# Patient Record
Sex: Male | Born: 1960 | Hispanic: No | Marital: Married | Smoking: Never smoker
Health system: Southern US, Community
[De-identification: ages and names within clinical notes are randomized; demographics above are authoritative.]

## PROBLEM LIST (undated history)

## (undated) DIAGNOSIS — M79606 Pain in leg, unspecified: Secondary | ICD-10-CM

## (undated) DIAGNOSIS — G709 Myoneural disorder, unspecified: Secondary | ICD-10-CM

## (undated) DIAGNOSIS — G20 Parkinson's disease: Secondary | ICD-10-CM

## (undated) DIAGNOSIS — G249 Dystonia, unspecified: Secondary | ICD-10-CM

## (undated) DIAGNOSIS — M1990 Unspecified osteoarthritis, unspecified site: Secondary | ICD-10-CM

## (undated) DIAGNOSIS — G20A1 Parkinson's disease without dyskinesia, without mention of fluctuations: Secondary | ICD-10-CM

## (undated) DIAGNOSIS — Z86010 Personal history of colonic polyps: Principal | ICD-10-CM

## (undated) HISTORY — DX: Personal history of colonic polyps: Z86.010

## (undated) HISTORY — DX: Myoneural disorder, unspecified: G70.9

## (undated) HISTORY — PX: OTHER SURGICAL HISTORY: SHX169

## (undated) HISTORY — DX: Parkinson's disease without dyskinesia, without mention of fluctuations: G20.A1

## (undated) HISTORY — DX: Pain in leg, unspecified: M79.606

## (undated) HISTORY — PX: COLONOSCOPY: SHX174

## (undated) HISTORY — DX: Parkinson's disease: G20

---

## 2010-11-10 ENCOUNTER — Encounter (INDEPENDENT_AMBULATORY_CARE_PROVIDER_SITE_OTHER): Payer: Self-pay | Admitting: *Deleted

## 2010-11-26 ENCOUNTER — Encounter (INDEPENDENT_AMBULATORY_CARE_PROVIDER_SITE_OTHER): Payer: Self-pay | Admitting: *Deleted

## 2010-11-28 ENCOUNTER — Ambulatory Visit
Admission: RE | Admit: 2010-11-28 | Discharge: 2010-11-28 | Payer: Self-pay | Source: Home / Self Care | Attending: Internal Medicine | Admitting: Internal Medicine

## 2010-12-10 ENCOUNTER — Other Ambulatory Visit: Payer: Self-pay | Admitting: Internal Medicine

## 2010-12-10 ENCOUNTER — Ambulatory Visit
Admission: RE | Admit: 2010-12-10 | Discharge: 2010-12-10 | Payer: Self-pay | Source: Home / Self Care | Attending: Internal Medicine | Admitting: Internal Medicine

## 2010-12-10 DIAGNOSIS — Z860100 Personal history of colon polyps, unspecified: Secondary | ICD-10-CM

## 2010-12-10 HISTORY — DX: Personal history of colonic polyps: Z86.010

## 2010-12-10 HISTORY — DX: Personal history of colon polyps, unspecified: Z86.0100

## 2010-12-15 ENCOUNTER — Encounter: Payer: Self-pay | Admitting: Internal Medicine

## 2010-12-25 NOTE — Procedures (Addendum)
Summary: Colonoscopy  Patient: [name] Note: All result statuses are Final unless otherwise noted.  Tests: (1) Colonoscopy (COL)   COL Colonoscopy           DONE     [facility]     [address]     [place], [place]  [address]           COLONOSCOPY PROCEDURE REPORT           PATIENT:  Antonio [name], Antonio [name]  MR#:  [id number]     BIRTHDATE:  19-Dec-1960, 49 yrs. old  GENDER:  male     ENDOSCOPIST:  Iva Boop, MD, [facility]     REF. BY:  Bradd Canary, M.D.     PROCEDURE DATE:  12/10/2010     PROCEDURE:  Colonoscopy with snare polypectomy     ASA CLASS:  Class II     INDICATIONS:  Routine Risk Screening     MEDICATIONS:   Fentanyl 50 mcg IV, Versed 5 mg IV           DESCRIPTION OF PROCEDURE:   After the risks benefits and     alternatives of the procedure were thoroughly explained, informed     consent was obtained.  Digital rectal exam was performed and     revealed no abnormalities and normal prostate.   The LB 180AL     [id number] endoscope was introduced through the anus and advanced to     the cecum, which was identified by both the appendix and ileocecal     valve, without limitations.  The quality of the prep was     excellent, using MoviPrep.  The instrument was then slowly     withdrawn as the colon was fully examined. Insertion: 3:53 minutes     Withdrawal: 12:51 minutes     <<PROCEDUREIMAGES>>           FINDINGS:  A diminutive polyp was found in the descending colon.     It was 5 mm in size.  This was otherwise a normal examination of     the colon.   Retroflexed views in the rectum revealed no     abnormalities.    The scope was then withdrawn from the patient     and the procedure completed.           COMPLICATIONS:  None     ENDOSCOPIC IMPRESSION:     1) 5 mm polyp in the descending colon - removed     2) Otherwise normal examination with excellent prep           REPEAT EXAM:  In for Colonoscopy, pending biopsy results.           Iva Boop, MD, [facility]          CC:  Bradd Canary, M.D. (at [facility])     The Patient           n.     eSIGNED:   Iva Boop at 12/10/2010 11:48 AM           Joeseph Amor, [id number]  Note: An exclamation mark (!) indicates a result that was not dispersed into the flowsheet. Document Creation Date: 12/10/2010 11:49 AM _______________________________________________________________________  (1) Order result status: Final Collection or observation date-time: 12/10/2010 11:42 Requested date-time:  Receipt date-time:  Reported date-time:  Referring Physician:   Ordering Physician: Stan Head [phone]) Specimen Source:  Source: Launa Grill Order Number: [id number] Lab site:  Appended Document: Colonoscopy     Procedures Next Due Date:    Colonoscopy: 11/2015

## 2010-12-25 NOTE — Miscellaneous (Signed)
Summary: [facility]  Clinical Lists Changes  Medications: Added new medication of MOVIPREP 100 GM  SOLR (PEG-KCL-NACL-NASULF-NA ASC-C) As per prep instructions. - Signed Rx of MOVIPREP 100 GM  SOLR (PEG-KCL-NACL-NASULF-NA ASC-C) As per prep instructions.;  #1 x 0;  Signed;  Entered by: Ezra Sites RN;  Authorized by: Iva Boop MD, [facility];  Method used: [phone] [address] [place], [place], [place]  [address], Ph: [phone] or [phone], Fax: [phone] Observations: Added new observation of NKA: T (11/28/2010 12:42)    Prescriptions: MOVIPREP 100 GM  SOLR (PEG-KCL-NACL-NASULF-NA ASC-C) As per prep instructions.  #1 x 0   Entered by:   Ezra Sites RN   Authorized by:   Iva Boop MD, [facility]   Signed by:   Ezra Sites RN on 11/28/2010   Method used:   [facility]ronically to        [facility]  [address] [phone][facility] (retail)       [address] [place], [place]  [address]       Ph: [phone] or [phone]       Fax: [phone]   RxID:   [id number]

## 2010-12-25 NOTE — Letter (Signed)
Summary: Patient Notice- Polyp Results  [facility]  [address] [place], [place] [address]   Phone: [phone]  Fax: [phone]        December 15, 2010 MRN: [id number]    Antonio [name] [address] [place], [place]  [address]    Dear [name],  The polyp removed from your colon was adenomatous. This means that it was pre-cancerous or that  it had the potential to change into cancer over time.  I recommend that you have a repeat colonoscopy in 5 years to determine if you have developed any new polyps over time and screen for colorectal cancer. If you develop any new rectal bleeding, abdominal pain or significant bowel habit changes, please contact us before then.  In addition to repeating colonoscopy, changing health habits may reduce your risk of having more colon or rectal  polyps and possibly, colorectal cancer. You may lower your risk of future polyps and colorectal cancer by adopting healthy habits such as not smoking or using tobacco (if you do), being physically active, losing weight (if overweight), and eating a diet which includes fruits and vegetables and limits red meat.  Please call us if you are having persistent problems or have questions about your condition that have not been fully answered at this time.  Sincerely,  Iva Boop MD, [facility]  This letter has been electronically signed by your physician.  Appended Document: Patient Notice- Polyp Results LETTER MAILED

## 2010-12-25 NOTE — Letter (Signed)
Summary: Moviprep Instructions  [facility]  [address]   [place], [place] [address]   Phone: [phone]  Fax: [phone]       WEBBER MICHIELS    1961/05/25    MRN: [id number]        Procedure Day Dorna Bloom: Wednesday, 12-10-10     Arrival Time: 10:30 a.m.     Procedure Time: 11:30 a.m.     Location of Procedure:                    x   [facility] (4th Floor)   PREPARATION FOR COLONOSCOPY WITH MOVIPREP   Starting 5 days prior to your procedure 12-05-10 do not eat nuts, seeds, popcorn, corn, beans, peas,  salads, or any raw vegetables.  Do not take any fiber supplements (e.g. Metamucil, Citrucel, and Benefiber).  THE DAY BEFORE YOUR PROCEDURE         DATE: 12-09-10  DAY: Tuesday  1.  Drink clear liquids the entire day-NO SOLID FOOD  2.  Do not drink anything colored red or purple.  Avoid juices with pulp.  No orange juice.  3.  Drink at least 64 oz. (8 glasses) of fluid/clear liquids during the day to prevent dehydration and help the prep work efficiently.  CLEAR LIQUIDS INCLUDE: Water Jello Ice Popsicles Tea (sugar ok, no milk/cream) Powdered fruit flavored drinks Coffee (sugar ok, no milk/cream) Gatorade Juice: apple, white grape, white cranberry  Lemonade Clear bullion, consomm, broth Carbonated beverages (any kind) Strained chicken noodle soup Hard Candy                             4.  In the morning, mix first dose of MoviPrep solution:    Empty 1 Pouch A and 1 Pouch B into the disposable container    Add lukewarm drinking water to the top line of the container. Mix to dissolve    Refrigerate (mixed solution should be used within 24 hrs)  5.  Begin drinking the prep at 5:00 p.m. The MoviPrep container is divided by 4 marks.   Every 15 minutes drink the solution down to the next mark (approximately 8 oz) until the full liter is complete.   6.  Follow completed prep with 16 oz of clear liquid of your choice (Nothing red or purple).   Continue to drink clear liquids until bedtime.  7.  Before going to bed, mix second dose of MoviPrep solution:    Empty 1 Pouch A and 1 Pouch B into the disposable container    Add lukewarm drinking water to the top line of the container. Mix to dissolve    Refrigerate  THE DAY OF YOUR PROCEDURE      DATE: 12-10-10 DAY: Wednesday  Beginning at 6:30 a.m. (5 hours before procedure):         1. Every 15 minutes, drink the solution down to the next mark (approx 8 oz) until the full liter is complete.  2. Follow completed prep with 16 oz. of clear liquid of your choice.    3. You may drink clear liquids until 9:30 a.m. (2 HOURS BEFORE PROCEDURE).   MEDICATION INSTRUCTIONS  Unless otherwise instructed, you should take regular prescription medications with a small sip of water   as early as possible the morning of your procedure.           OTHER INSTRUCTIONS  You will need a responsible adult  at least 53 years of age to accompany you and drive you home.   This person must remain in the waiting room during your procedure.  Wear loose fitting clothing that is easily removed.  Leave jewelry and other valuables at home.  However, you may wish to bring a book to read or  an iPod/MP3 player to listen to music as you wait for your procedure to start.  Remove all body piercing jewelry and leave at home.  Total time from sign-in until discharge is approximately 2-3 hours.  You should go home directly after your procedure and rest.  You can resume normal activities the  day after your procedure.  The day of your procedure you should not:   Drive   Make legal decisions   Operate machinery   Drink alcohol   Return to work  You will receive specific instructions about eating, activities and medications before you leave.    The above instructions have been reviewed and explained to me by  Ezra Sites RN  November 28, 2010 1:00 PM      I fully understand and can  verbalize these instructions _____________________________ Date _________

## 2010-12-25 NOTE — Letter (Signed)
Summary: Pre Visit Letter Revised  [facility]  [address] [place], [place] [address]   Phone: [phone]  Fax: [phone]        11/10/2010 MRN: [id number] Antonio [name] [address] [place], [place]  [address]             Procedure Date:  12/10/2010  Welcome to the Gastroenterology Division at [facility].    You are scheduled to see a nurse for your pre-procedure visit on 11/28/2010 at 1:00 on the 3rd floor at [facility], [address]  We ask that you try to arrive at our office 15 minutes prior to your appointment time to allow for check-in.  Please take a minute to review the attached form.  If you answer "Yes" to one or more of the questions on the first page, we ask that you call the person listed at your earliest opportunity.  If you answer "No" to all of the questions, please complete the rest of the form and bring it to your appointment.    Your nurse visit will consist of discussing your medical and surgical history, your immediate family medical history, and your medications.   If you are unable to list all of your medications on the form, please bring the medication bottles to your appointment and we will list them.  We will need to be aware of both prescribed and over the counter drugs.  We will need to know exact dosage information as well.    Please be prepared to read and sign documents such as consent forms, a financial agreement, and acknowledgement forms.  If necessary, and with your consent, a friend or relative is welcome to sit-in on the nurse visit with you.  Please bring your insurance card so that we may make a copy of it.  If your insurance requires a referral to see a specialist, please bring your referral form from your primary care physician.  No co-pay is required for this nurse visit.     If you cannot keep your appointment, please call [phone] to cancel or reschedule prior to your appointment date.  This  allows [place] the opportunity to schedule an appointment for another patient in need of care.    Thank you for choosing [facility] for your medical needs.  We appreciate the opportunity to care for you.  Please visit [place] at our website  to learn more about our practice.  Sincerely, The Gastroenterology Division

## 2013-05-19 ENCOUNTER — Telehealth: Payer: Self-pay | Admitting: Neurology

## 2013-06-16 ENCOUNTER — Telehealth: Payer: Self-pay | Admitting: Neurology

## 2013-06-19 ENCOUNTER — Ambulatory Visit: Payer: Self-pay | Admitting: Neurology

## 2013-06-22 ENCOUNTER — Encounter: Payer: Self-pay | Admitting: Neurology

## 2013-06-23 ENCOUNTER — Ambulatory Visit (INDEPENDENT_AMBULATORY_CARE_PROVIDER_SITE_OTHER): Payer: BC Managed Care – PPO | Admitting: Neurology

## 2013-06-23 ENCOUNTER — Encounter: Payer: Self-pay | Admitting: Neurology

## 2013-06-23 VITALS — BP 120/73 | HR 57 | Ht 75.0 in | Wt 248.0 lb

## 2013-06-23 DIAGNOSIS — F32A Depression, unspecified: Secondary | ICD-10-CM

## 2013-06-23 DIAGNOSIS — G20 Parkinson's disease: Secondary | ICD-10-CM | POA: Insufficient documentation

## 2013-06-23 DIAGNOSIS — G2581 Restless legs syndrome: Secondary | ICD-10-CM

## 2013-06-23 DIAGNOSIS — F3289 Other specified depressive episodes: Secondary | ICD-10-CM

## 2013-06-23 DIAGNOSIS — F329 Major depressive disorder, single episode, unspecified: Secondary | ICD-10-CM

## 2013-06-23 NOTE — Progress Notes (Addendum)
Provider:  Dr Hosie Poisson Referring Provider: No ref. provider found Primary Care Physician:  No primary provider on file.  No chief complaint on file.   HPI:  Antonio [name] is a 52 y.o. male here as a follow up of his Parkinson's disease characterized predominantly by right sided tremor and bradykinesia. Last visit was Jan 2013.  Interim Hx:  Overall doing well since last visit. Feels his PD is well controlled, main difficulty is with RLS symptoms at night.  Medications/compliance: Has self adjusted his medications to a regimen that gives him the most relief. Currently taking Sinemet 2 tabs at breakfast, lunch and 5pm and 1 tablet at bedtime. Also taking Mirapex 0.25mg  1 tab at 9pm and 1 tab at 11-12pm. Feels this regimen helps him to function well during the day and sleep at night with controlled RLS symptoms. Has tried taking Mirapex 0.25mg  during the day but did not notice a benefit. Tried Azilect in the past but did not tolerated due to adverse effects.   Benefit onset/wearing off/side effects Denies any motor fluctuations. Recently has noticed some mild dyskineisas, unsure if peak dose or not. Tolerating DA well, no ICD, no hallucinations, no excessive fatigue, no LE edema  Muscle cramps/neck pain/sialorrhea: Denies  Sleep/RBD: Sleeping well, no RBD, no vivid dreams. Gets up once a night to urinate  Constipation/mood/anxiety Minimal constipation, eats fiber.Notes some depressed mood/irritation. Wishes to monitor and try medication adjustments  Gait/falls/exercise: No gait instability or falls. Remains active, working 2 jobs.  Per Dr Alena Bills last note from 11/25/2011: 52y/o gentleman R handed initially seen 09/2008 with R sided stiffness, + family hx of PD and exposure to pesticides. Initially started on Azilect. Mirapex added 2011 due to increased difficulty using R hand. Notes pain in his feet. No ICD with DA. Started Sinemet 25-100 TID. Stopped Azilect. At last visit, plan was to take  off of Mirapex, switch to Neupro 2mg  and take Sinemet 25-100 2 tablets at breakfast, 2 tabs at lunch and 1 tablet at 4pm.   Concerns/Questions:Review of Systems: Out of a complete 14 system review, the patient complains of only the following symptoms, and all other reviewed systems are negative. Positive for sleepiness restless legs  History   Social History  . Marital Status: Married    Spouse Name: Antonio [name]    Number of Children: 3  . Years of Education: N/A   Occupational History  .  [employer]   Social History Main Topics  . Smoking status: Never Smoker   . Smokeless tobacco: Never Used  . Alcohol Use: 3.0 oz/week    5 Glasses of wine per week  . Drug Use: No  . Sexually Active: Not on file   Other Topics Concern  . Not on file   Social History Narrative      Patient lives at home with his wife Antonio [name]. 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   Caffeine- one daily             Family History  Problem Relation Age of Onset  . Heart disease Mother   . Diabetes Father   . Parkinson's disease Father     Past Medical History  Diagnosis Date  . Leg pain left  . Parkinson's disease     Past Surgical History  Procedure Laterality Date  . None listed      Current Outpatient Prescriptions  Medication Sig Dispense Refill  . carbidopa-levodopa (PARCOPA) 25-100 MG per disintegrating tablet Take 1 tablet by  mouth 3 (three) times daily.      . cholecalciferol (VITAMIN D) 1000 UNITS tablet Take 1,000 Units by mouth daily.      Marland Kitchen ibuprofen (ADVIL,MOTRIN) 200 MG tablet Take 200 mg by mouth every 6 (six) hours as needed for pain.      . rotigotine (NEUPRO) 2 MG/24HR Place 1 patch onto the skin daily.      . valACYclovir (VALTREX) 1000 MG tablet Take 1,000 mg by mouth as needed.       No current facility-administered medications for this visit.    Allergies as of 06/23/2013  . (Not on File)    Vitals: There were no vitals taken for this  visit. Last Weight:  Wt Readings from Last 1 Encounters:  No data found for Wt   Last Height:   Ht Readings from Last 1 Encounters:  No data found for Ht     Physical exam: Exam: Gen: NAD, conversant Eyes: anicteric sclerae, moist conjunctivae HENT: [place] Lungs: CTA, no wheezing, rales, rhonic                          CV: RRR, no MRG Abdomen: Soft, non-tender;  Extremities: No peripheral edema  Skin: Normal temperature, no rash,  Psych: Appropriate affect, pleasant  Neuro: MS: AA&Ox3, appropriately interactive, normal affect   Attention: WORLD backwards  Speech: fluent w/o paraphasic error  Memory: good recent and remote recall  CN: PERRL, EOMI no nystagmus, no ptosis, sensation intact to LT V1-V3 bilat, face symmetric, no weakness, hearing grossly intact, palate elevates symmetrically, shoulder shrug 5/5 bilat,  tongue protrudes midline, no fasiculations noted.  Motor: normal bulk and tone Strength: 5/5  In all extremities  Coord: rapid alternating and point-to-point (FNF, HTS) movements intact.  Reflexes: symmetrical, bilat downgoing toes  Sens: LT intact in all extremities  Speech: hypophonic/dysarthria  Motor UPDRS brief:  Facial Expression: Mild masked facies  Tremor: Rest R 1 L 0 Action/postural R 1 L 0 Rigidity: RUE 1 LUE 0 Finger taps:   R: 1 L: 0  Open/close hands: R: 1 L: 0  Foot taps: R: 1 L: 0  Arising from Chair: 0 Gait/FOG: Upright, good stride, no shuffling steps, decreased arm swing on R, negative pull test   Assessment:  After physical and neurologic examination, review of laboratory studies, imaging, neurophysiology testing and pre-existing records, assessment will be reviewed on the problem list.  Plan:  Treatment plan and additional workup will be reviewed under Problem List.  Antonio [name] is a pleasant 52 year old gentleman with a diagnosis of Parkinson disease since 2009. Returns today for follow up visit with  his last visit being over one year ago. He reports overall he is doing clinically well, his main concern is continued difficulty with restless leg syndrome at night. He has self adjusted his medications and is currently taking a total of 700 mg of L-dopa daily and 0.5 mg of Mirapex daily. She is tolerating both his medications well and is not noting any adverse effects or motor or enough. Exam is clinically stable with predominant right-sided symptoms of mild tremor and bradykinesia.  #1 Parkinson's disease  #2 restless leg syndrome  #3 depression  For his age and disease state (HY 1 to 1.5)he is currently on a rather high dose of L-dopa, we discussed this concern and the potential risk of prolonged high-dose L-dopa therapy at a younger age. Patient expressed understanding of the risk of developing motor fluctuations, wearing  off and dyskinesias. Based on his preference of self adjusting his medication we discussed trying to self wean off some of the L-dopa and potentially trying to increase the dopamine agonist dose. The patient will attempt this over the next 6 months until his next visit. He was instructed to call the office with any concerns or questions. Potential adverse effects of DA use were discussed. Consider checking iron levels at next visit.   A total of 30 minutes was spent with this patient. Over half this time was spent in direct face-to-face consultation. We discussed his current diagnoses. We discussed potential risk of the medications he is on. We discussed potential risk of high-dose L-dopa therapy and ways to reduce this. All questions were fully answered.   I have read the note, and I agree with the clinical assessment and plan.  Antonio [name]

## 2013-06-23 NOTE — Patient Instructions (Addendum)
Overall you are doing fairly well but I do want to suggest a few things today:   Remember to drink plenty of fluid, eat healthy meals and do not skip any meals. Try to eat protein with a every meal and eat a healthy snack such as fruit or nuts in between meals. Try to keep a regular sleep-wake schedule and try to exercise daily, particularly in the form of walking, 20-30 minutes a day, if you can.   As far as your medications are concerned, I would like to suggest you try to decrease your total dose of Sinemet daily. We discussed different options such as trying to increase her Mirapex and/or and down and spread a night or L-dopa doses.  I would like to see you back in 6 months, sooner if we need to. Please call us with any interim questions, concerns, problems, updates or refill requests.   Please also call us for any test results so we can go over those with you on the phone.  My clinical assistant and will answer any of your questions and relay your messages to me and also relay most of my messages to you.   Our phone number is [phone]. We also have an after hours call service for urgent matters and there is a physician on-call for urgent questions. For any emergencies you know to call 911 or go to the nearest emergency room

## 2013-12-29 ENCOUNTER — Encounter: Payer: Self-pay | Admitting: Neurology

## 2013-12-29 ENCOUNTER — Ambulatory Visit (INDEPENDENT_AMBULATORY_CARE_PROVIDER_SITE_OTHER): Payer: BC Managed Care – PPO | Admitting: Neurology

## 2013-12-29 VITALS — BP 108/64 | HR 55 | Ht 75.0 in | Wt 253.0 lb

## 2013-12-29 DIAGNOSIS — G2581 Restless legs syndrome: Secondary | ICD-10-CM

## 2013-12-29 DIAGNOSIS — G20 Parkinson's disease: Secondary | ICD-10-CM

## 2013-12-29 MED ORDER — PRAMIPEXOLE DIHYDROCHLORIDE 0.75 MG PO TABS: 0.7500 mg | ORAL_TABLET | Freq: Three times a day (TID) | ORAL | Status: DC

## 2013-12-29 MED ORDER — CARBIDOPA-LEVODOPA 25-100 MG PO TABS: 0.5000 | ORAL_TABLET | Freq: Three times a day (TID) | ORAL | Status: DC

## 2013-12-29 NOTE — Patient Instructions (Addendum)
Overall you are doing fairly well but I do want to suggest a few things today:   Remember to drink plenty of fluid, eat healthy meals and do not skip any meals. Try to eat protein with a every meal and eat a healthy snack such as fruit or nuts in between meals. Try to keep a regular sleep-wake schedule and try to exercise daily, particularly in the form of walking, 20-30 minutes a day, if you can.   As far as your medications are concerned, I would like to suggest the following: 1)Increase your Mirapex to 0.75mg  three times a day 2)Cut your Sinemet to 1/2 tablet three times a day  Try this new regimen for 4 weeks and then give our office a call.    I would like to see you back in 6 months, sooner if we need to. Please call us with any interim questions, concerns, problems, updates or refill requests.   My clinical assistant and will answer any of your questions and relay your messages to me and also relay most of my messages to you.   Our phone number is [phone]. We also have an after hours call service for urgent matters and there is a physician on-call for urgent questions. For any emergencies you know to call 911 or go to the nearest emergency room

## 2013-12-29 NOTE — Progress Notes (Signed)
Provider:  Dr Hosie Poisson Referring Provider: No ref. provider found Primary Care Physician:  No primary provider on file.  Chief Complaint  Patient presents with  . Follow-up    Rm 9  . Parkinson disease    HPI:  Antonio [name] is a 53 y.o. male here as a follow up of his Parkinson's disease characterized predominantly by right sided tremor and bradykinesia. Last visit was 06/2013, at that time was doing well overall. He felt his PD was well controlled and his main concern was RLS symptoms at night. We discussed trying to lower his dose of L-dopa and increase the DA to lower risk of motor fluctuations. He cut Sinemet back to 1 tablet 25-100 3 to 4 times a day. He notes when he gets stressed/anxious he will notice some tremors in his right arm. Currently taking the Mirapex 0.25mg  TID. Overall feels that the PD is well controlled. When medication starts to wear off he will get slow and stiff.   RLS is stable, if he stays on medication schedule he gets good benefit, will typically take a Mirapex at night time and then add a Sinemet if needed.    Concerns/Questions:Review of Systems: Out of a complete 14 system review, the patient complains of only the following symptoms, and all other reviewed systems are negative. Positive for fatigue, restless legs  History   Social History  . Marital Status: Married    Spouse Name: Dorene Grebe    Number of Children: 3  . Years of Education: N/A   Occupational History  .  [employer]   Social History Main Topics  . Smoking status: Never Smoker   . Smokeless tobacco: Never Used  . Alcohol Use: 3.0 oz/week    5 Glasses of wine per week  . Drug Use: No  . Sexual Activity: Not on file   Other Topics Concern  . Not on file   Social History Narrative   Patient is right handed   Patient lives at home with his wife Dorene Grebe. 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   Caffeine- one daily                Family  History  Problem Relation Age of Onset  . Heart disease Mother   . Diabetes Father   . Parkinson's disease Father     Past Medical History  Diagnosis Date  . Leg pain left  . Parkinson's disease     Past Surgical History  Procedure Laterality Date  . None listed      Current Outpatient Prescriptions  Medication Sig Dispense Refill  . cholecalciferol (VITAMIN D) 1000 UNITS tablet Take 1,000 Units by mouth daily.      Marland Kitchen ibuprofen (ADVIL,MOTRIN) 200 MG tablet Take 200 mg by mouth every 6 (six) hours as needed for pain.      . valACYclovir (VALTREX) 1000 MG tablet Take 1,000 mg by mouth as needed.      . carbidopa-levodopa (SINEMET IR) 25-100 MG per tablet Take 0.5 tablets by mouth 3 (three) times daily.  90 tablet  6  . pramipexole (MIRAPEX) 0.75 MG tablet Take 1 tablet (0.75 mg total) by mouth 3 (three) times daily.  90 tablet  6   No current facility-administered medications for this visit.    Allergies as of 12/29/2013  . (No Known Allergies)    Vitals: BP 108/64  Pulse 55  Ht 6\' 3"  (1.905 m)  Wt  253 lb (114.76 kg)  BMI 31.62 kg/m2 Last Weight:  Wt Readings from Last 1 Encounters:  12/29/13 253 lb (114.76 kg)   Last Height:   Ht Readings from Last 1 Encounters:  12/29/13 6\' 3"  (1.905 m)     Physical exam: Exam: Gen: NAD, conversant Eyes: anicteric sclerae, moist conjunctivae HENT: Atraumati Lungs: CTA, no wheezing, rales, rhonic                          CV: RRR, no MRG Abdomen: Soft, non-tender;  Extremities: No peripheral edema  Skin: Normal temperature, no rash,  Psych: Appropriate affect, pleasant  Neuro: MS: AA&Ox3, appropriately interactive, normal affect   Speech: fluent w/o paraphasic error  Memory: good recent and remote recall  CN: PERRL, EOMI no nystagmus, no ptosis, sensation intact to LT V1-V3 bilat, face symmetric, no weakness, hearing grossly intact, palate elevates symmetrically, shoulder shrug 5/5 bilat,  tongue protrudes midline,  no fasiculations noted.  Motor: normal bulk and tone Strength: 5/5  In all extremities  Coord: rapid alternating and point-to-point (FNF, HTS) movements intact.  Reflexes: symmetrical, bilat downgoing toes  Sens: LT intact in all extremities  Speech: hypophonic/dysarthria  Motor UPDRS brief:  Facial Expression: Mild masked facies  Tremor: Rest R 1 L 0 Action/postural R 1 L 0 Rigidity: RUE 1 LUE 0 Finger taps:   R: 1 L: 0  Open/close hands: R: 1 L: 0  Foot taps: R: 1 L: 0  Arising from Chair: 0 Gait/FOG: Upright, good stride, no shuffling steps, decreased arm swing on R, negative pull test   Assessment:  After physical and neurologic examination, review of laboratory studies, imaging, neurophysiology testing and pre-existing records, assessment will be reviewed on the problem list.  Plan:  Treatment plan and additional workup will be reviewed under Problem List.   #1 Parkinson's disease  #2 restless leg syndrome  #3 depression   Antonio [name] is a pleasant 53 year old gentleman with a diagnosis of Parkinson disease since 2009. Returns today for follow up visit with his last visit being 6 months ago. He reports overall he is doing clinically well, He has cut back on his Sinemet 25-100 to TID and continues to take Mirapex TID. Tolerating both medications well. Will plan to decrease Sinemet to 1/2 tablet TID and increase Mirapex to 0.75mg  TID. Patient to call in 4 weeks and update us on his status. Follow up in 6 months.

## 2014-06-19 ENCOUNTER — Other Ambulatory Visit: Payer: Self-pay | Admitting: Neurology

## 2014-06-28 ENCOUNTER — Ambulatory Visit: Payer: BC Managed Care – PPO | Admitting: Neurology

## 2014-07-17 ENCOUNTER — Ambulatory Visit (INDEPENDENT_AMBULATORY_CARE_PROVIDER_SITE_OTHER): Payer: BC Managed Care – PPO | Admitting: Neurology

## 2014-07-17 ENCOUNTER — Encounter: Payer: Self-pay | Admitting: Neurology

## 2014-07-17 VITALS — BP 112/73 | HR 57 | Wt 243.0 lb

## 2014-07-17 DIAGNOSIS — G2581 Restless legs syndrome: Secondary | ICD-10-CM

## 2014-07-17 DIAGNOSIS — G20 Parkinson's disease: Secondary | ICD-10-CM

## 2014-07-17 MED ORDER — PRAMIPEXOLE DIHYDROCHLORIDE 1 MG PO TABS: 1.0000 mg | ORAL_TABLET | Freq: Three times a day (TID) | ORAL | Status: DC

## 2014-07-17 NOTE — Progress Notes (Signed)
Provider:  Dr Hosie Poisson Referring Provider: No ref. provider found Primary Care Physician:  No primary provider on file.  Chief Complaint  Patient presents with  . Follow-up    Room 9  . Parkinson's disease    HPI:  Antonio [name] is a 53 y.o. male here as a follow up of his Parkinson's disease characterized predominantly by right sided tremor and bradykinesia. Last visit was 12/2013, at that time was doing well overall. He felt his PD was well controlled and his main concern was RLS symptoms at night. We decreased his Sinemet and increased his Mirapex at that time. Notes a progression of his symptoms since last visit. Having difficulty walking, notes he is shuffling his feet more, having increased difficulty with balance, having falls. Tremor in right hand has increased. Notes some bradykinesia in left hand. Notes worsening fine motor skills in his hands, his fingers feel weak. The patient and his wife express interest in DBS surgery in the future.   He notes he tried to go completely off the Sinemet and is now using it as needed for when he feels he needs to be more active. At most he is taking 1 tablet TID. He remains on Mirapex 0.75mg  TID, tolerating well, denies any adverse effects, no ICD.   RLS is stable, if he stays on medication schedule he gets good benefit, will typically take a Mirapex at night time which helps.    Concerns/Questions:Review of Systems: Out of a complete 14 system review, the patient complains of only the following symptoms, and all other reviewed systems are negative. Positive for fatigue, restless legs  History   Social History  . Marital Status: Married    Spouse Name: Dorene Grebe    Number of Children: 3  . Years of Education: Ph.D   Occupational History  .  [employer]   Social History Main Topics  . Smoking status: Never Smoker   . Smokeless tobacco: Never Used  . Alcohol Use: 3.0 oz/week    5 Glasses of wine per week  . Drug Use: No  . Sexual  Activity: Not on file   Other Topics Concern  . Not on file   Social History Narrative   Patient is right handed   Patient lives at home with his wife Dorene Grebe. 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   Caffeine- one daily   Patient has a Ph.D.             Family History  Problem Relation Age of Onset  . Heart disease Mother   . Diabetes Father   . Parkinson's disease Father     Past Medical History  Diagnosis Date  . Leg pain left  . Parkinson's disease     Past Surgical History  Procedure Laterality Date  . None listed      Current Outpatient Prescriptions  Medication Sig Dispense Refill  . carbidopa-levodopa (SINEMET IR) 25-100 MG per tablet Take 0.5 tablets by mouth 3 (three) times daily.  90 tablet  6  . cholecalciferol (VITAMIN D) 1000 UNITS tablet Take 1,000 Units by mouth daily.      Marland Kitchen ibuprofen (ADVIL,MOTRIN) 200 MG tablet Take 200 mg by mouth every 6 (six) hours as needed for pain.      . pramipexole (MIRAPEX) 0.75 MG tablet TAKE 1 TABLET 3 TIMES A DAY  270 tablet  0  . valACYclovir (VALTREX) 1000 MG tablet  Take 1,000 mg by mouth as needed.       No current facility-administered medications for this visit.    Allergies as of 07/17/2014  . (No Known Allergies)    Vitals: BP 112/73  Pulse 57  Wt 243 lb (110.224 kg) Last Weight:  Wt Readings from Last 1 Encounters:  07/17/14 243 lb (110.224 kg)   Last Height:   Ht Readings from Last 1 Encounters:  12/29/13  (1.905 m)     Physical exam: Exam: Gen: NAD, conversant Eyes: anicteric sclerae, moist conjunctivae HENT: Atraumati Lungs: CTA, no wheezing, rales, rhonic                          CV: RRR, no MRG Abdomen: Soft, non-tender;  Extremities: No peripheral edema  Skin: Normal temperature, no rash,  Psych: Appropriate affect, pleasant  Neuro: MS: AA&Ox3, appropriately interactive, normal affect   Speech: fluent w/o  paraphasic error  Memory: good recent and remote recall  CN: PERRL, EOMI no nystagmus, no ptosis, sensation intact to LT V1-V3 bilat, face symmetric, no weakness, hearing grossly intact, palate elevates symmetrically, shoulder shrug 5/5 bilat,  tongue protrudes midline, no fasiculations noted.  Motor: normal bulk and tone Strength: 5/5  In all extremities  Coord: rapid alternating and point-to-point (FNF, HTS) movements intact.  Reflexes: symmetrical, bilat downgoing toes  Sens: LT intact in all extremities  Speech: hypophonic/dysarthria  Motor UPDRS brief:  Facial Expression: Mild masked facies  Tremor: Rest R 1 L 0 Action/postural R 1 L 1 Rigidity: RUE 1 LUE 1 Finger taps:   R: 1 L: 1  Open/close hands: R: 1 L: 1  Foot taps: R: 3 L: 1  Arising from Chair: 0 Gait/FOG: Upright, increased shuffling of feet R>L, decreased arm swing on R, negative pull test   Assessment:  After physical and neurologic examination, review of laboratory studies, imaging, neurophysiology testing and pre-existing records, assessment will be reviewed on the problem list.  Plan:  Treatment plan and additional workup will be reviewed under Problem List.   1)Parkinson's disease 2)RLS  Mr. Eleonore Chiquito is a pleasant 53 year old gentleman with a diagnosis of Parkinson disease since 2009. Returns today for follow up visit with his last visit being 6 months ago. He reports overall he is doing clinically well but feels his PD has progressed slightly. Since prior visit he has stopped his Sinemet and began using it on a PRN basis. Counseled him on the risks of this strategy, he expressed understanding. Will increase Mirapex to  TID. If no improvement will consider addition of Azilect or restarting low dose Sinemet or Rytary. Counseled patient on benefits of frequent exercise. Discussed BIG therapy, he wishes to hold off at this time and will consider in the future. Discussed DBS in depth with  patient and spouse, explained that he is not a candidate at this point but it will likely be an option in the future. They will follow up in 4 months with Dr Frances Furbish.

## 2014-07-19 ENCOUNTER — Other Ambulatory Visit: Payer: Self-pay

## 2014-07-19 MED ORDER — PRAMIPEXOLE DIHYDROCHLORIDE 1 MG PO TABS: 1.0000 mg | ORAL_TABLET | Freq: Three times a day (TID) | ORAL | Status: DC

## 2014-07-19 NOTE — Telephone Encounter (Signed)
Pharmacy requested 90 day Rx  

## 2014-12-20 ENCOUNTER — Encounter: Payer: BC Managed Care – PPO | Admitting: Neurology

## 2014-12-25 ENCOUNTER — Ambulatory Visit (INDEPENDENT_AMBULATORY_CARE_PROVIDER_SITE_OTHER): Payer: BLUE CROSS/BLUE SHIELD | Admitting: Neurology

## 2014-12-25 ENCOUNTER — Encounter: Payer: Self-pay | Admitting: Neurology

## 2014-12-25 VITALS — BP 108/66 | HR 58 | Temp 97.4°F | Ht 75.0 in | Wt 252.0 lb

## 2014-12-25 DIAGNOSIS — G20 Parkinson's disease: Secondary | ICD-10-CM

## 2014-12-25 DIAGNOSIS — G2581 Restless legs syndrome: Secondary | ICD-10-CM

## 2014-12-25 MED ORDER — PRAMIPEXOLE DIHYDROCHLORIDE 1 MG PO TABS: 1.0000 mg | ORAL_TABLET | Freq: Three times a day (TID) | ORAL | Status: DC

## 2014-12-25 MED ORDER — CARBIDOPA-LEVODOPA 25-100 MG PO TABS: 1.0000 | ORAL_TABLET | Freq: Four times a day (QID) | ORAL | Status: DC

## 2014-12-25 NOTE — Progress Notes (Signed)
Subjective:    Patient ID: Antonio [name] is a 54 y.o. male.  HPI     Interim history:   Antonio [name] is a very pleasant 54 year old right-handed gentleman with an underlying benign medical history, who presents for follow-up consultation of his right-sided predominant Parkinson's disease and his restless leg syndrome. The patient is unaccompanied today. This is his first visit with me and he previously followed with Dr. Elspeth Cho was last seen by him on 07/17/2014, at which time the patient reported having stopped Sinemet and he was using it as needed only. His Mirapex was increased to 1 mg 3 times a day as he also reported residual restless leg symptoms. The addition of Azilect was discussed.  He talked about DBS for future consideration. Prior to that he was followed by Dr. Avie Echevaria and was last seen by Dr. Sandria Manly on 11/25/2011. He was originally seen by Dr. Sandria Manly in November 2009 at which time he presented with right-sided stiffness and fine motor dyscontrol. He has a positive family history of Parkinson's disease in his father. He was initially started on Azilect and Mirapex was added in 2011. He started Sinemet 25-100 milligrams strength one pill 3 times a day and eventually stop the Azilect, due to side effects reported.  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 He is a non-smoker, he drinks wine usually on weekends.   Today, he reports, that he noted a R hand tremor in the last 6 months. He has work related stress. He sleeps well generally speaking. Confined spaces are more difficulty to negotiate. He has mild memory issues. He has not fallen. He did not do well without the Sinemet and has since then restarted it. He takes it 3 times a day along with one pill of Mirapex. He has not taken his midday dose yet. He has no significant side effects except for mild sleepiness. He has never fallen asleep while driving. He has mild swelling in his feet at times  especially on longer plane rides. He has to travel internationally maybe 3 times a year. His medication dose does last for 4 and sometimes 5 hours but at the end of the day especially if he's had a long work day he feels fatigued and more off. He goes to bed late. It is usually between midnight and 1 AM. He has a rise time of 7:30 AM. He snores when he sleeps on his back but does not have any gasping sensations are witnessed apneas as he recalls. His RLS symptoms are under control.   His Past Medical History Is Significant For: Past Medical History  Diagnosis Date  . Leg pain left  . Parkinson's disease     His Past Surgical History Is Significant For: Past Surgical History  Procedure Laterality Date  . None listed      His Family History Is Significant For: Family History  Problem Relation Age of Onset  . Heart disease Mother   . Diabetes Father   . Parkinson's disease Father     His Social History Is Significant For: History   Social History  . Marital Status: Married    Spouse Name: Antonio [name]    Number of Children: 3  . Years of Education: Ph.D   Occupational History  .  [employer]   Social History Main Topics  . Smoking status: Never Smoker   . Smokeless tobacco: Never Used  . Alcohol Use: 3.0 oz/week  5 Glasses of wine per week  . Drug Use: No  . Sexual Activity: None   Other Topics Concern  . None   Social History Narrative   Patient is right handed   Patient lives at home with his wife Antonio Grebeatalie. 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   Caffeine- one daily   Patient has a Ph.D.             His Allergies Are:  No Known Allergies:   His Current Medications Are:  Outpatient Encounter Prescriptions as of 12/25/2014  Medication Sig  . carbidopa-levodopa (SINEMET IR) 25-100 MG per tablet Take 0.5 tablets by mouth 3 (three) times daily.  . cholecalciferol (VITAMIN D) 1000 UNITS tablet Take 1,000  Units by mouth daily.  Marland Kitchen. ibuprofen (ADVIL,MOTRIN) 200 MG tablet Take 200 mg by mouth every 6 (six) hours as needed for pain.  . pramipexole (MIRAPEX) 1 MG tablet Take 1 tablet (1 mg total) by mouth 3 (three) times daily.  . valACYclovir (VALTREX) 1000 MG tablet Take 1,000 mg by mouth as needed.  :  Review of Systems:  Out of a complete 14 point review of systems, all are reviewed and negative with the exception of these symptoms as listed below:   Review of Systems  Gastrointestinal:       Urination problems  Neurological: Positive for tremors.       Restless legs, sleepiness  Psychiatric/Behavioral:       Anxiety    Objective:  Neurologic Exam  Physical Exam Physical Examination:   Filed Vitals:   12/25/14 1259  BP: 108/66  Pulse: 58  Temp: 97.4 F (36.3 C)    General Examination: The patient is a very pleasant 54 y.o. male in no acute distress.  HEENT: Normocephalic, atraumatic, pupils are equal, round and reactive to light and accommodation. Funduscopic exam is normal with sharp disc margins noted. Extraocular tracking shows mild saccadic breakdown without nystagmus noted. There is limitation to lower gaze. There is mild decrease in eye blink rate. Hearing is intact. Face is symmetric with mild facial masking and normal facial sensation. There is no lip, neck or jaw tremor. Neck is mildly rigid with intact passive ROM. There are no carotid bruits on auscultation. Oropharynx exam reveals mild mouth dryness. No significant airway crowding is noted except for mildly enlarged uvula. Mallampati is class II. Tongue protrudes centrally and palate elevates symmetrically.   There is no drooling.   Chest: is clear to auscultation without wheezing, rhonchi or crackles noted.  Heart: sounds are regular and normal without murmurs, rubs or gallops noted.   Abdomen: is soft, non-tender and non-distended with normal bowel sounds appreciated on auscultation.  Extremities: There is trace  pitting edema in the distal lower extremities bilaterally, around both ankles.   Skin: is warm and dry with no trophic changes noted. Age-related changes are noted on the skin.   Musculoskeletal: exam reveals no obvious joint deformities, tenderness, joint swelling or erythema.  Neurologically:  Mental status: The patient is awake and alert, paying good  attention. He is able to completely provide the history. He is oriented to: person, place, time/date, situation, day of week, month of year and year. His memory, attention, language and knowledge are intact. There is no aphasia, agnosia, apraxia or anomia. There is no bradyphrenia. Speech is mildly hypophonic with no dysarthria noted. Mood is congruent and affect is normal.   Cranial nerves  are as described above under HEENT exam. In addition, shoulder shrug is normal with equal shoulder height noted.  Motor exam: Normal bulk, and strength for age is noted. There are no dyskinesias noted. Tone is mildly rigid with presence of cogwheeling in the right upper extremity. There is overall mild bradykinesia. There is no drift or rebound.  There is a mild resting tremor in the right upper extremity and no other resting tremor. The tremor is intermittent.  Romberg is positive with a tendency to fall to the right.  Reflexes are 1+ in the upper extremities and 1+ in the lower extremities. Toes are downgoing bilaterally.  Fine motor skills exam: Finger taps are mildly impaired on the right and mildly impaired on the left. Hand movements are mildly impaired on the right and not impaired on the left. RAP (rapid alternating patting) is mildly impaired on the right and not impaired on the left. Foot taps are mildly impaired on the right and minimally impaired on the left. Foot agility (in the form of heel stomping) is mildly impaired on the right and not impaired on the left.    Cerebellar testing shows no dysmetria or intention tremor on finger to nose testing.  Heel to shin is unremarkable bilaterally. There is no truncal or gait ataxia.   Sensory exam is intact to light touch, pinprick, vibration, temperature sense and proprioception in the upper and lower extremities.   Gait, station and balance: He stands up from the seated position with mild difficulty and does not need to push up with His hands. He needs no assistance. No veering to one side is noted. He is noted to lean to the right. Posture is mildly stooped, advanced for age. Stance is wide-based. He walks with decrease in stride length and pace and decreased arm swing on the right. He turns in 3 steps. Balance is mildly impaired.   Assessment and Plan:   In summary, ESPN ZEMAN is a very pleasant 54 y.o.-year old male with an underlying benign medical history, who presents for follow-up consultation of his right-sided predominant Parkinson's disease and his restless leg syndrome. His history and physical exam are in keeping with right-sided predominant Parkinson's disease, with overall mild findings. Symptoms date back to 2009. He has had good success with Mirapex and Sinemet in combination. He does feel that in the evening he has more fatigue and more off symptoms. Generally, he takes his medication at 8 AM, 12 or 1 and then at 5 PM. He tries to stay on schedule with his medication and tries to take it away from his mealtimes. He is trying to stay active but does not necessarily exercise on a regular basis. He tries to drink enough water. He has no particular side effects but he does have trace edema in his lower extremities and I reminded him that dopamine agonists can at times cause swelling. We will continue to monitor this. At this juncture, I have advised to continue with Mirapex 1 mg 3 times a day and Sinemet 25-100 milligrams strength 3 times a day at the same dose times. In addition, he can add a half pill of Sinemet as needed in the evenings. He would like to try this. I adjusted his  prescription for Sinemet in that regard and provided at 90 day prescription as well as a 90 day prescription for Mirapex with refills. I would like to see him back in 4 months, sooner if the need arises.  We talked about maintaining  a healthy lifestyle in general. I encouraged the patient to eat healthy, exercise daily and keep well hydrated, to keep a scheduled bedtime and wake time routine, to not skip any meals and eat healthy snacks in between meals and to have protein with every meal. In particular, I stressed the importance of regular exercise, within of course the patient's own mobility limitations.   I encouraged him to call or email with any interim questions, concerns, problems or updates and refill requests.

## 2014-12-25 NOTE — Patient Instructions (Addendum)
I think your Parkinson's disease has remained fairly stable, which is reassuring. Nevertheless, as you know, this disease does progress with time. It can affect your balance, your memory, your mood, your bowel and bladder function, your posture, balance and walking. Overall you are doing fairly well but I do want to suggest a few things today:  Remember to drink plenty of fluid, eat healthy meals and do not skip any meals. Try to eat protein with a every meal and eat a healthy snack such as fruit or nuts in between meals. Try to keep a regular sleep-wake schedule and try to exercise daily, particularly in the form of walking, 20-30 minutes a day, if you can.   Taking your medication on schedule is key.   Try to stay active physically and mentally. Engage in social activities in your community and with your family and try to keep up with current events by reading the newspaper or watching the news. Try to do word puzzles and you may like to do word puzzles and brain games on the computer such as on [email or web address].   As far as your medications are concerned, I would like to suggest that you take your current medication with the following additional changes:  Take an additional 1/2 pill of Sinemet in the evening as needed. Keep taking mirapex 1 mg 3 times a day and along with one pill of sinemet 3 times a day.  I would like to see you back in 4 months, sooner if we need to. Please call us or email with any interim questions, concerns, problems, updates or refill requests.  Our phone number is [phone]. We also have an after hours call service for urgent matters and there is a physician on-call for urgent questions, that cannot wait till the next work day. For any emergencies you know to call 911 or go to the nearest emergency room.

## 2015-04-24 ENCOUNTER — Encounter: Payer: Self-pay | Admitting: Neurology

## 2015-04-24 ENCOUNTER — Ambulatory Visit (INDEPENDENT_AMBULATORY_CARE_PROVIDER_SITE_OTHER): Payer: BLUE CROSS/BLUE SHIELD | Admitting: Neurology

## 2015-04-24 VITALS — BP 128/72 | HR 66 | Resp 18 | Ht 75.0 in | Wt 243.0 lb

## 2015-04-24 DIAGNOSIS — G2581 Restless legs syndrome: Secondary | ICD-10-CM

## 2015-04-24 DIAGNOSIS — G20 Parkinson's disease: Secondary | ICD-10-CM

## 2015-04-24 MED ORDER — RASAGILINE MESYLATE 1 MG PO TABS: 1.0000 mg | ORAL_TABLET | Freq: Every day | ORAL | Status: DC

## 2015-04-24 NOTE — Progress Notes (Signed)
Subjective:    [name] ID: Antonio [name] is a 54 y.o. male.  HPI     Interim history:   Antonio [name] is a very pleasant 54 year old right-handed gentleman with an underlying benign medical history, who presents for follow-up consultation of his right-sided predominant Parkinson's disease and his restless leg syndrome. Antonio [name] is unaccompanied today. I first met him on 12/25/2014, at which time he reported improved parkinsonian symptoms with Mirapex and Sinemet. I asked him to continue his medications, Mirapex 1 mg strength one pill 3 times a day and Sinemet 25-100 milligrams strength one pill 3 times a day. I suggested he could add a half pill of Sinemet as needed in Antonio evenings.   Today, 04/24/2015: He reports some worsening of his balance and increase in tremors. He fell backwards recently as he stood up from a chair and had some stutter steps backwards and could not hold himself. He fell onto his back and felt sore for a day or 2 but did not injure himself thankfully. He did not hit his head. He has mild symptoms on Antonio left but primarily difficulty with fine motor control in Antonio right upper extremity. He works 2 jobs. He does not drink enough water. He does not sleep as much at night. His memory is stable. He denies any significant depression. His restless legs symptoms are under control and he feels well in that regard. He takes his medication before breakfast, approximately an hour before lunch, and around 4 PM. His driving he feels is stable.  Previously:   He previously followed with Dr. Jim Like was last seen by him on 07/17/2014, at which time Antonio [name] reported having stopped Sinemet and he was using it as needed only. His Mirapex was increased to 1 mg 3 times a day as he also reported residual restless leg symptoms. Antonio addition of Azilect was discussed.  He talked about DBS for future consideration.  Prior to that he was followed by Dr. Morene Antu and was last seen by Dr. Erling Cruz  on 11/25/2011. He was originally seen by Dr. Erling Cruz in November 2009 at which time he presented with right-sided stiffness and fine motor dyscontrol. He has a positive family history of Parkinson's disease in his father. He was initially started on Azilect and Mirapex was added in 2011. He started Sinemet 25-100 milligrams strength one pill 3 times a day and eventually stop Antonio Azilect, due to side effects reported, including cognitive SEs.  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 He is a non-smoker, he drinks wine usually on weekends.   He noted a R hand tremor in Antonio last 6 months. He has work related stress. He sleeps well generally speaking. Confined spaces are more difficulty to negotiate. He has mild memory issues. He has not fallen. He did not do well without Antonio Sinemet and has since then restarted it. He takes it 3 times a day along with one pill of Mirapex. He has not taken his midday dose yet. He has no significant side effects except for mild sleepiness. He has never fallen asleep while driving. He has mild swelling in his feet at times especially on longer plane rides. He has to travel internationally maybe 3 times a year. His medication dose does last for 4 and sometimes 5 hours but at Antonio end of Antonio day especially if he's had a long work day he feels fatigued and more off. He  goes to bed late. It is usually between midnight and 1 AM. He has a rise time of 7:30 AM. He snores when he sleeps on his back but does not have any gasping sensations are witnessed apneas as he recalls. His RLS symptoms are under control.   His Past Medical History Is Significant For: Past Medical History  Diagnosis Date  . Leg pain left  . Parkinson's disease     His Past Surgical History Is Significant For: Past Surgical History  Procedure Laterality Date  . None listed      His Family History Is Significant For: Family History  Problem Relation Age of Onset  .  Heart disease Mother   . Diabetes Father   . Parkinson's disease Father     His Social History Is Significant For: History   Social History  . Marital Status: Married    Spouse Name: Lanelle Bal  . Number of Children: 3  . Years of Education: Ph.D   Occupational History  .  [employer]   Social History Main Topics  . Smoking status: Never Smoker   . Smokeless tobacco: Never Used  . Alcohol Use: 3.0 oz/week    5 Glasses of wine per week  . Drug Use: No  . Sexual Activity: Not on file   Other Topics Concern  . None   Social History Narrative   [name] is right handed   [name] lives at home with his wife Lanelle Bal. 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   Caffeine- coffee on occasion    [name] has a Ph.D.             His Allergies Are:  No Known Allergies:   His Current Medications Are:  Outpatient Encounter Prescriptions as of 04/24/2015  Medication Sig  . carbidopa-levodopa (SINEMET IR) 25-100 MG per tablet Take 1 tablet by mouth 4 (four) times daily.  . cholecalciferol (VITAMIN D) 1000 UNITS tablet Take 1,000 Units by mouth daily.  Marland Kitchen ibuprofen (ADVIL,MOTRIN) 200 MG tablet Take 200 mg by mouth every 6 (six) hours as needed for pain.  . pramipexole (MIRAPEX) 1 MG tablet Take 1 tablet (1 mg total) by mouth 3 (three) times daily.  . valACYclovir (VALTREX) 1000 MG tablet Take 1,000 mg by mouth as needed.   No facility-administered encounter medications on file as of 04/24/2015.  :  Review of Systems:  Out of a complete 14 point review of systems, all are reviewed and negative with Antonio exception of these symptoms as listed below:   Review of Systems  Neurological:       [name] feels like medications are not working as well for Antonio past 4 months. Reports increased balance issues and increased tremors on R side.      Objective:  Neurologic Exam  Physical Exam Physical Examination:   Filed Vitals:    04/24/15 1548  BP: 128/72  Pulse: 66  Resp: 18    General Examination: Antonio [name] is a very pleasant 54 y.o. male in no acute distress.  HEENT: Normocephalic, atraumatic, pupils are equal, round and reactive to light and accommodation. Funduscopic exam is normal with sharp disc margins noted. Extraocular tracking shows mild saccadic breakdown without nystagmus noted. There is limitation to lower gaze, unchanged. There is mild decrease in eye blink rate. Hearing is intact. Face is symmetric with mild facial masking and normal facial sensation. There is no lip, neck or jaw tremor. Neck  is mildly rigid with intact passive ROM. There are no carotid bruits on auscultation. Oropharynx exam reveals mild mouth dryness. No significant airway crowding is noted except for mildly enlarged uvula. Mallampati is class II. Tongue protrudes centrally and palate elevates symmetrically.   There is no drooling.   Chest: is clear to auscultation without wheezing, rhonchi or crackles noted.  Heart: sounds are regular and normal without murmurs, rubs or gallops noted.   Abdomen: is soft, non-tender and non-distended with normal bowel sounds appreciated on auscultation.  Extremities: There is trace pitting edema in Antonio distal lower extremities bilaterally, around both ankles, unchanged.   Skin: is warm and dry with no trophic changes noted. Age-related changes are noted on Antonio skin, he has mild spider veins in Antonio distal lower extremities, no varicose veins.   Musculoskeletal: exam reveals no obvious joint deformities, tenderness, joint swelling or erythema.  Neurologically:  Mental status: Antonio [name] is awake and alert, paying good  attention. He is able to completely provide Antonio history. He is oriented to: person, place, time/date, situation, day of week, month of year and year. His memory, attention, language and knowledge are intact. There is no aphasia, agnosia, apraxia or anomia. There is no bradyphrenia.  Speech is mildly hypophonic with no dysarthria noted. Mood is congruent and affect is normal.   Cranial nerves are as described above under HEENT exam. In addition, shoulder shrug is normal with equal shoulder height noted.  Motor exam: Normal bulk, and strength for age is noted. There are no dyskinesias noted. Tone is mildly rigid with presence of cogwheeling in Antonio right upper extremity. There is overall mild bradykinesia. There is no drift or rebound.  There is a mild resting tremor in Antonio right upper extremity and no other resting tremor. Antonio tremor is intermittent.  Romberg is positive with a tendency to fall to Antonio right.  Reflexes are 1+ in Antonio upper extremities and 1+ in Antonio lower extremities.   Fine motor skills exam: Finger taps are mild to moderately impaired on Antonio right and mildly impaired on Antonio left. Hand movements are mild to moderately impaired on Antonio right and not impaired on Antonio left. RAP (rapid alternating patting) is mildly impaired on Antonio right and not impaired on Antonio left. Foot taps are mild to moderately impaired on Antonio right and mildly impaired on Antonio left. Foot agility (in Antonio form of heel stomping) is mildly impaired on Antonio right and not impaired on Antonio left.    Cerebellar testing shows no dysmetria or intention tremor on finger to nose testing. Heel to shin is unremarkable bilaterally. There is no truncal or gait ataxia.   Sensory exam is intact to light touch, pinprick, vibration, temperature sense in Antonio upper and lower extremities.   Gait, station and balance: He stands up from Antonio seated position with mild difficulty and does push up with His hands. He needs no assistance. No veering to one side is noted. He is noted to lean to Antonio right. Posture is mildly stooped, advanced for age. Stance is wide-based. He walks with decrease in stride length and pace and decreased  to absent arm swing on Antonio right. He has a slight limp on Antonio right and almost seems to drag his right  leg.He turns in 3 steps. Balance is mildly impaired.   Assessment and Plan:   In summary, Antonio [name] is a very pleasant 54 year old male with an otherwise benign medical history, who presents for follow-up consultation  of his parkinsonism and his restless leg syndrome. His history and physical exam are in keeping with right-sided predominant Parkinson's disease, with  slightly worse findings today. Symptoms started in 2009 and he started seeing Dr. Erling Cruz. He has had fairly good success with Mirapex and Sinemet. He is currently on 3 mg of Mirapex and 3 pills of Sinemet, 25-100 milligrams strength. He had tried Azilect in Antonio past but had some side effects as far as he recalls, possibly cognitive side effects he feels. He would be open to trying it again. He did not have a reaction to it. I suggested we restart Azilect at 0.5 mg once daily and I encouraged him to call or email me with an update in about a month and we can decide to go up to 1 mg once daily without. For now, we will keep Antonio Sinemet and Mirapex Antonio same. He tries to stay on schedule with his medications. He is trying to stay active but does not necessarily exercise on a regular basis. He tries to drink enough water. He has no particular side effects but he does have trace edema in his lower extremities and I reminded him that dopamine agonists can at times cause swelling. his swelling has remained stable. We will continue to monitor this.  he did not need refills on his Mirapex or Sinemet at this time. He has 90 day prescriptions on file for those. We also again brushed upon Antonio potential of pursuing surgical treatment in Antonio form of DBS in Antonio future. He is open to getting a consultation for this. We will pick up our discussion next time. I would like to see him back in 4 months, sooner if Antonio need arises.  I answered all his questions today and he was in agreement. I encouraged him to call or email with any interim questions, concerns,  problems or updates and refill requests.  I spent 25 minutes in total face-to-face time with Antonio [name], more than 50% of which was spent in counseling and coordination of care, reviewing test results, reviewing medication and discussing or reviewing Antonio diagnosis of PD, its prognosis and treatment options.

## 2015-04-24 NOTE — Patient Instructions (Addendum)
I think your Parkinson's disease has remained fairly stable, which is reassuring. Nevertheless, as you know, this disease does progress with time. It can affect your balance, your memory, your mood, your bowel and bladder function, your posture, balance and walking. Overall you are doing fairly well but I do want to suggest a few things today:  Remember to drink plenty of fluid, eat healthy meals and do not skip any meals. Try to eat protein with a every meal and eat a healthy snack such as fruit or nuts in between meals. Try to keep a regular sleep-wake schedule and try to exercise daily, particularly in the form of walking, 20-30 minutes a day, if you can.   Taking your medication on schedule is key.   Try to stay active physically and mentally. Engage in social activities in your community and with your family and try to keep up with current events by reading the newspaper or watching the news. Try to do word puzzles and you may like to do word puzzles and brain games on the computer such as on [email or web address].   As far as your medications are concerned, I would like to suggest that you take your current medication with the following additional changes:  We will retry Azilect (generic name: rasagiline) 1 mg strength: take 1/2 pill each morning for about 4 weeks, then call or email through My Chart for an update on how you feel and how you are tolerating it. We may be able to go to 1 pill daily. Common side effects reported are headaches, nausea, diarrhea, or constipation. Rare side effects can be confusion, personality changes, hallucinations.  I would like to see you back in 4 months, sooner if we need to. Please call us with any interim questions, concerns, problems, updates or refill requests.  Our phone number is [phone]. We also have an after hours call service for urgent matters and there is a physician on-call for urgent questions, that cannot wait till the next work day. For any emergencies  you know to call 911 or go to the nearest emergency room.

## 2015-04-25 ENCOUNTER — Ambulatory Visit: Payer: BLUE CROSS/BLUE SHIELD | Admitting: Neurology

## 2015-06-01 ENCOUNTER — Encounter: Payer: Self-pay | Admitting: Neurology

## 2015-06-10 ENCOUNTER — Other Ambulatory Visit: Payer: Self-pay | Admitting: Neurology

## 2015-06-10 NOTE — Progress Notes (Signed)
Per pt email: azilect 0.5 mg daily is helpful. I will ask him to increase to a full pill, 1 mg daily.

## 2015-08-27 ENCOUNTER — Ambulatory Visit: Payer: BLUE CROSS/BLUE SHIELD | Admitting: Neurology

## 2015-09-09 ENCOUNTER — Encounter: Payer: Self-pay | Admitting: Neurology

## 2015-09-09 ENCOUNTER — Ambulatory Visit (INDEPENDENT_AMBULATORY_CARE_PROVIDER_SITE_OTHER): Payer: BLUE CROSS/BLUE SHIELD | Admitting: Neurology

## 2015-09-09 VITALS — BP 118/62 | HR 62 | Resp 18 | Ht 75.0 in | Wt 255.0 lb

## 2015-09-09 DIAGNOSIS — G20 Parkinson's disease: Secondary | ICD-10-CM | POA: Diagnosis not present

## 2015-09-09 DIAGNOSIS — G20A1 Parkinson's disease without dyskinesia, without mention of fluctuations: Secondary | ICD-10-CM

## 2015-09-09 DIAGNOSIS — G2581 Restless legs syndrome: Secondary | ICD-10-CM | POA: Diagnosis not present

## 2015-09-09 MED ORDER — CARBIDOPA-LEVODOPA ER 50-200 MG PO TBCR: 1.0000 | EXTENDED_RELEASE_TABLET | Freq: Every day | ORAL | Status: DC

## 2015-09-09 MED ORDER — PRAMIPEXOLE DIHYDROCHLORIDE 1 MG PO TABS: 1.0000 mg | ORAL_TABLET | Freq: Three times a day (TID) | ORAL | Status: DC

## 2015-09-09 MED ORDER — RASAGILINE MESYLATE 1 MG PO TABS: 1.0000 mg | ORAL_TABLET | Freq: Every day | ORAL | Status: DC

## 2015-09-09 MED ORDER — CARBIDOPA-LEVODOPA 25-100 MG PO TABS: 1.0000 | ORAL_TABLET | Freq: Three times a day (TID) | ORAL | Status: DC

## 2015-09-09 NOTE — Patient Instructions (Addendum)
I think your Parkinson's disease has remained fairly stable, which is reassuring. Nevertheless, as you know, this disease does progress with time. It can affect your balance, your memory, your mood, your bowel and bladder function, your posture, balance and walking and your activities of daily living. However, there are good supportive treatments and symptomatic treatments available, so most patients have a change to a good quality life and life expectancy is not typically altered. Overall you are doing fairly well but I do want to suggest a few things today:  Remember to drink plenty of fluid at least 6 glasses (8 oz each), eat healthy meals and do not skip any meals. Try to eat protein with a every meal and eat a healthy snack such as fruit or nuts in between meals. Try to keep a regular sleep-wake schedule and try to exercise daily, particularly in the form of walking, 20-30 minutes a day, if you can.   Taking your medication on schedule is key.   Try to stay active physically and mentally. Engage in social activities in your community and with your family and try to keep up with current events by reading the newspaper or watching the news. Try to do word puzzles and you may like to do puzzles and brain games on the computer such as on [email or web address].   As far as your medications are concerned, I would like to suggest that you take your current medication with the following additional changes: continue meds. We will add a long acting Sinemet CR 50-200 mg to your regimen, to be taken at bedtime.       I would like to see you back in 4 months, sooner if we need to. Please call us with any interim questions, concerns, problems, updates or refill requests.  You can email me through my chart and also leave a phone message for Lafonda Mossesiana, my nurse.

## 2015-09-09 NOTE — Progress Notes (Signed)
Subjective:    Patient ID: Antonio [name] is a 54 y.o. male.  HPI    Interim history:   Antonio [name] is a very pleasant 54 year-old right-handed gentleman with an underlying benign medical history, who presents for follow-up consultation of his right-sided predominant Parkinson's disease and his restless leg syndrome. The patient is unaccompanied today. I last saw him on 04/24/2015, at which time the patient reported worsening balance and increase in his tremors. Unfortunately, he had fallen recently and fell backwards as he stood up from a chair and fell onto his back after which she felt sore for a couple of days but thankfully did not injure himself seriously. He did not have any head injury or loss of consciousness. He was working 2 jobs. He was not drinking enough water. He was not sleeping as well at night. Memory was stable. He denied significant depressive symptoms. His restless legs symptoms were under control. I suggested she continue with Sinemet and Mirapex, but I asked him to restart Azilect which he had tried in the past. We talked about potentially pursuing DBS surgery in the future.  Today, 09/09/2015: He reports that he addition of Azilect was helpful and he has virtually no side effects.  Previously:   I first met him on 12/25/2014, at which time he reported improved parkinsonian symptoms with Mirapex and Sinemet. I asked him to continue his medications, Mirapex 1 mg strength one pill 3 times a day and Sinemet 25-100 milligrams strength one pill 3 times a day. I suggested he could add a half pill of Sinemet as needed in the evenings.   He previously followed with Dr. Jim Like was last seen by him on 07/17/2014, at which time the patient reported having stopped Sinemet and he was using it as needed only. His Mirapex was increased to 1 mg 3 times a day as he also reported residual restless leg symptoms. The addition of Azilect was discussed.  He talked about DBS for future  consideration.  Prior to that he was followed by Dr. Morene Antu and was last seen by Dr. Erling Cruz on 11/25/2011. He was originally seen by Dr. Erling Cruz in November 2009 at which time he presented with right-sided stiffness and fine motor dyscontrol. He has a positive family history of Parkinson's disease in his father. He was initially started on Azilect and Mirapex was added in 2011. He started Sinemet 25-100 milligrams strength one pill 3 times a day and eventually stop the Azilect, due to side effects reported, including cognitive SEs.  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 He is a non-smoker, he drinks wine usually on weekends.   He noted a R hand tremor in the last 6 months. He has work related stress. He sleeps well generally speaking. Confined spaces are more difficulty to negotiate. He has mild memory issues. He has not fallen. He did not do well without the Sinemet and has since then restarted it. He takes it 3 times a day along with one pill of Mirapex. He has not taken his midday dose yet. He has no significant side effects except for mild sleepiness. He has never fallen asleep while driving. He has mild swelling in his feet at times especially on longer plane rides. He has to travel internationally maybe 3 times a year. His medication dose does last for 4 and sometimes 5 hours but at the end of the day especially if he's had  a long work day he feels fatigued and more off. He goes to bed late. It is usually between midnight and 1 AM. He has a rise time of 7:30 AM. He snores when he sleeps on his back but does not have any gasping sensations are witnessed apneas as he recalls. His RLS symptoms are under control.     His Past Medical History Is Significant For: Past Medical History  Diagnosis Date  . Leg pain left  . Parkinson's disease ([place])     His Past Surgical History Is Significant For: Past Surgical History  Procedure Laterality Date  . None  listed      His Family History Is Significant For: Family History  Problem Relation Age of Onset  . Heart disease Mother   . Diabetes Father   . Parkinson's disease Father     His Social History Is Significant For: Social History   Social History  . Marital Status: Married    Spouse Name: Lanelle Bal  . Number of Children: 3  . Years of Education: Ph.D   Occupational History  .  [employer]   Social History Main Topics  . Smoking status: Never Smoker   . Smokeless tobacco: Never Used  . Alcohol Use: 3.0 oz/week    5 Glasses of wine per week  . Drug Use: No  . Sexual Activity: Not Asked   Other Topics Concern  . None   Social History Narrative   Patient is right handed   Patient lives at home with his wife Lanelle Bal. 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   Caffeine- coffee on occasion    Patient has a Ph.D.             His Allergies Are:  No Known Allergies:   His Current Medications Are:  Outpatient Encounter Prescriptions as of 09/09/2015  Medication Sig  . carbidopa-levodopa (SINEMET IR) 25-100 MG per tablet Take 1 tablet by mouth 4 (four) times daily. (Patient taking differently: Take 1 tablet by mouth 3 (three) times daily. )  . cholecalciferol (VITAMIN D) 1000 UNITS tablet Take 1,000 Units by mouth daily.  Marland Kitchen ibuprofen (ADVIL,MOTRIN) 200 MG tablet Take 200 mg by mouth every 6 (six) hours as needed for pain.  . pramipexole (MIRAPEX) 1 MG tablet Take 1 tablet (1 mg total) by mouth 3 (three) times daily. (Patient taking differently: Take 1 mg by mouth 3 (three) times daily. Taking at 8am, 12pm, and 5 pm)  . rasagiline (AZILECT) 1 MG TABS tablet Take 1 tablet (1 mg total) by mouth daily.  . valACYclovir (VALTREX) 1000 MG tablet Take 1,000 mg by mouth as needed.   No facility-administered encounter medications on file as of 09/09/2015.  :  Review of Systems:  Out of a complete 14 point review of  systems, all are reviewed and negative with the exception of these symptoms as listed below:   Review of Systems  Neurological:       No new concern. Patient reports that things have improved since starting the Azilect.     Objective:  Neurologic Exam  Physical Exam Physical Examination:   Filed Vitals:   09/09/15 1403  BP: 118/62  Pulse: 62  Resp: 18    General Examination: The patient is a very pleasant 54 y.o. male in no acute distress.  HEENT: Normocephalic, atraumatic, pupils are equal, round and reactive to light and accommodation. He has new glasses. Extraocular  tracking shows mild saccadic breakdown without nystagmus noted. There is limitation to lower gaze, unchanged. There is mild decrease in eye blink rate. Hearing is intact. Face is symmetric with mild to moderate facial masking and normal facial sensation. There is no lip, neck or jaw tremor. Neck is mildly rigid with intact passive ROM. There are no carotid bruits on auscultation. Oropharynx exam reveals mild mouth dryness. No significant airway crowding is noted except for mildly enlarged uvula. Mallampati is class II. Tongue protrudes centrally and palate elevates symmetrically. There is no drooling.   Chest: is clear to auscultation without wheezing, rhonchi or crackles noted.  Heart: sounds are regular and normal without murmurs, rubs or gallops noted.   Abdomen: is soft, non-tender and non-distended with normal bowel sounds appreciated on auscultation.  Extremities: There is trace to 1+ pitting edema in the distal lower extremities bilaterally, around both ankles, unchanged.   Skin: is warm and dry with no trophic changes noted. Age-related changes are noted on the skin, he has mild spider veins in the distal lower extremities, no varicose veins.   Musculoskeletal: exam reveals no obvious joint deformities, tenderness, joint swelling or erythema.  Neurologically:  Mental status: The patient is awake and alert,  paying good  attention. He is able to completely provide the history. He is oriented to: person, place, time/date, situation, day of week, month of year and year. His memory, attention, language and knowledge are intact. There is no aphasia, agnosia, apraxia or anomia. There is no bradyphrenia. Speech is mildly hypophonic with no dysarthria noted. Mood is congruent and affect is normal.   Cranial nerves are as described above under HEENT exam. In addition, shoulder shrug is normal with equal shoulder height noted.  Motor exam: Normal bulk, and strength for age is noted. There are no dyskinesias noted. Tone is mildly rigid with presence of cogwheeling in the right upper extremity. There is overall mild bradykinesia. There is no drift or rebound.  There is a slight intermittent resting tremor in the right upper extremity and no other resting tremor. The tremor is intermittent.  Romberg is negative. Reflexes are 1+ in the upper extremities and 1+ in the lower extremities.   Fine motor skills exam: Finger taps are mild to moderately impaired on the right and mildly impaired on the left. Hand movements are mild to moderately impaired on the right and not impaired on the left. RAP (rapid alternating patting) is mildly impaired on the right and not impaired on the left. Foot taps are mild to moderately impaired on the right and mildly impaired on the left. Foot agility (in the form of heel stomping) is mildly impaired on the right and not impaired on the left.    Cerebellar testing shows no dysmetria or intention tremor on finger to nose testing. Heel to shin is unremarkable bilaterally. There is no truncal or gait ataxia.   Sensory exam is intact to light touch in the upper and lower extremities.   Gait, station and balance: He stands up from the seated position with mild difficulty and does push up with His hands. He needs no assistance. No veering to one side is noted. He is noted to lean to the right.  Posture is mildly stooped, advanced for age. Stance is wide-based. He walks with decrease in stride length and pace and decreased  to near-absent arm swing on the right. He has a slight limp on the right. He turns in 3 steps. Balance is mildly impaired.  Assessment and Plan:   In summary, CASEY FYE is a very pleasant 54 year old male with an otherwise benign medical history, who presents for follow-up consultation of his parkinsonism and his restless leg syndrome. His history and physical exam are in keeping with right-sided predominant Parkinson's disease, with symptoms dating back to 2009. He has had fairly good success with Mirapex and Sinemet over the course of time. He is currently on 3 mg of Mirapex and 3 pills of Sinemet, 25-100 milligrams strength. He had tried Azilect in the past but had some side effects, but is now tolerating it well. He feels that it has been helpful. His exam is stable for the most part with the exception of slightly worse lower extremity edema. He is advised to monitor for this. He is again reminded that dopamine agonists can cause swelling. Memory and mood are stable. His last Sinemet dose is around 5 PM. He is advised to try Sinemet CR at bedtime which can be an as-needed basis medication for him he wishes. To that end, we will keep everything the same today with the addition of Sinemet CR 50-200 milligrams strength 1 at bedtime. I provided him with new prescriptions for Sinemet, Sinemet CR, Azilect and Mirapex today. I would like to see him back in 4 months, sooner if needed. I asked him to stay active physically and mentally and drink enough water.  I answered all his questions today and he was in agreement. I encouraged him to call or email with any interim questions, concerns, problems or updates and refill requests.  I spent 25 minutes in total face-to-face time with the patient, more than 50% of which was spent in counseling and coordination of care, reviewing test  results, reviewing medication and discussing or reviewing the diagnosis of PD, its prognosis and treatment options.

## 2015-12-23 ENCOUNTER — Encounter: Payer: Self-pay | Admitting: Internal Medicine

## 2016-01-13 ENCOUNTER — Encounter: Payer: Self-pay | Admitting: Neurology

## 2016-01-13 ENCOUNTER — Ambulatory Visit (INDEPENDENT_AMBULATORY_CARE_PROVIDER_SITE_OTHER): Payer: BLUE CROSS/BLUE SHIELD | Admitting: Neurology

## 2016-01-13 VITALS — BP 120/68 | HR 76 | Resp 20 | Ht 75.0 in | Wt 261.0 lb

## 2016-01-13 DIAGNOSIS — M5431 Sciatica, right side: Secondary | ICD-10-CM | POA: Diagnosis not present

## 2016-01-13 DIAGNOSIS — G20 Parkinson's disease: Secondary | ICD-10-CM | POA: Diagnosis not present

## 2016-01-13 NOTE — Progress Notes (Signed)
Subjective:    Patient ID: Antonio [name] is a 55 y.o. male.  HPI     Interim history:  Antonio [name] is a very pleasant 55 year-old right-handed gentleman with an underlying benign medical history, who presents for follow-up consultation of his right-sided predominant Parkinson's disease and his restless leg syndrome. The patient is unaccompanied today. I last saw him on 09/09/2015, at which time he reported that the addition of Azilect was helpful. I suggested he continue with Sinemet tid, Mirapex 1 mg tid, Azilect once daily and try C/L CR at night.  Today, 01/13/16: He reports feeling fairly stable. He does admit that he took the CR only for a few nights and then stopped taking it as he did not notice much in the way of difference. He has no recent restless leg symptoms. Mirapex has helped with that. He continues to take Sinemet 3 times a day, Mirapex 3 times a day and Azilect once daily, which is now generic. He did take a fall about a  week ago but this was in the context of carrying something up the stairs, looking at his phone at the same time and he missed the last step up, fell forward and scraped his knee.  Mood and memory are stable. Does not always drink enough water and does not always exercise regularly. For the past few weeks he has had right-sided sciatica symptoms, with radiation of pain through the right gluteal region in the back of his thigh. He is constantly lifting something at work. He has tried over-the-counter anti-inflammatory medication and heat, moving around helps some.  Previously:   I saw him on 04/24/2015, at which time the patient reported worsening balance and increase in his tremors. Unfortunately, he had fallen recently and fell backwards as he stood up from a chair and fell onto his back after which she felt sore for a couple of days but thankfully did not injure himself seriously. He did not have any head injury or loss of consciousness. He was working 2 jobs. He was  not drinking enough water. He was not sleeping as well at night. Memory was stable. He denied significant depressive symptoms. His restless legs symptoms were under control. I suggested she continue with Sinemet and Mirapex, but I asked him to restart Azilect which he had tried in the past. We talked about potentially pursuing DBS surgery in the future.  I first met him on 12/25/2014, at which time he reported improved parkinsonian symptoms with Mirapex and Sinemet. I asked him to continue his medications, Mirapex 1 mg strength one pill 3 times a day and Sinemet 25-100 milligrams strength one pill 3 times a day. I suggested he could add a half pill of Sinemet as needed in the evenings.   He previously followed with Dr. Jim Like was last seen by him on 07/17/2014, at which time the patient reported having stopped Sinemet and he was using it as needed only. His Mirapex was increased to 1 mg 3 times a day as he also reported residual restless leg symptoms. The addition of Azilect was discussed.  He talked about DBS for future consideration.  Prior to that he was followed by Dr. Morene Antu and was last seen by Dr. Erling Cruz on 11/25/2011. He was originally seen by Dr. Erling Cruz in November 2009 at which time he presented with right-sided stiffness and fine motor dyscontrol. He has a positive family history of Parkinson's disease in his father. He was initially started on Azilect  and Mirapex was added in 2011. He started Sinemet 25-100 milligrams strength one pill 3 times a day and eventually stop the Azilect, due to side effects reported, including cognitive SEs.  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 He is a non-smoker, he drinks wine usually on weekends.   He noted a R hand tremor in the last 6 months. He has work related stress. He sleeps well generally speaking. Confined spaces are more difficulty to negotiate. He has mild memory issues. He has not fallen. He did not  do well without the Sinemet and has since then restarted it. He takes it 3 times a day along with one pill of Mirapex. He has not taken his midday dose yet. He has no significant side effects except for mild sleepiness. He has never fallen asleep while driving. He has mild swelling in his feet at times especially on longer plane rides. He has to travel internationally maybe 3 times a year. His medication dose does last for 4 and sometimes 5 hours but at the end of the day especially if he's had a long work day he feels fatigued and more off. He goes to bed late. It is usually between midnight and 1 AM. He has a rise time of 7:30 AM. He snores when he sleeps on his back but does not have any gasping sensations are witnessed apneas as he recalls. His RLS symptoms are under control.    His Past Medical History Is Significant For: Past Medical History  Diagnosis Date  . Leg pain left  . Parkinson's disease ([place])     His Past Surgical History Is Significant For: Past Surgical History  Procedure Laterality Date  . None listed      His Family History Is Significant For: Family History  Problem Relation Age of Onset  . Heart disease Mother   . Diabetes Father   . Parkinson's disease Father     His Social History Is Significant For: Social History   Social History  . Marital Status: Married    Spouse Name: Lanelle Bal  . Number of Children: 3  . Years of Education: Ph.D   Occupational History  .  [employer]   Social History Main Topics  . Smoking status: Never Smoker   . Smokeless tobacco: Never Used  . Alcohol Use: 3.0 oz/week    5 Glasses of wine per week  . Drug Use: No  . Sexual Activity: Not Asked   Other Topics Concern  . None   Social History Narrative   Patient is right handed   Patient lives at home with his wife Lanelle Bal. 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   Caffeine- coffee on occasion    Patient has a  Ph.D.             His Allergies Are:  No Known Allergies:   His Current Medications Are:  Outpatient Encounter Prescriptions as of 01/13/2016  Medication Sig  . carbidopa-levodopa (SINEMET CR) 50-200 MG tablet Take 1 tablet by mouth at bedtime.  . carbidopa-levodopa (SINEMET IR) 25-100 MG tablet Take 1 tablet by mouth 3 (three) times daily.  . cholecalciferol (VITAMIN D) 1000 UNITS tablet Take 1,000 Units by mouth daily.  Marland Kitchen ibuprofen (ADVIL,MOTRIN) 200 MG tablet Take 200 mg by mouth every 6 (six) hours as needed for pain.  . pramipexole (MIRAPEX) 1 MG tablet  Take 1 tablet (1 mg total) by mouth 3 (three) times daily. Taking at 8am, 12pm, and 5 pm  . rasagiline (AZILECT) 1 MG TABS tablet Take 1 tablet (1 mg total) by mouth daily.  . valACYclovir (VALTREX) 1000 MG tablet Take 1,000 mg by mouth as needed.   No facility-administered encounter medications on file as of 01/13/2016.  :  Review of Systems:  Out of a complete 14 point review of systems, all are reviewed and negative with the exception of these symptoms as listed below:   Review of Systems  Neurological:       Patient reports increased stiffness and pain in R leg.     Objective:  Neurologic Exam  Physical Exam Physical Examination:   Filed Vitals:   01/13/16 1620  BP: 120/68  Pulse: 76  Resp: 20    General Examination: The patient is a very pleasant 55 y.o. male in no acute distress.  HEENT: Normocephalic, atraumatic, pupils are equal, round and reactive to light and accommodation. He has new glasses. Extraocular tracking shows mild saccadic breakdown without nystagmus noted. There is limitation to lower gaze, unchanged. There is mild decrease in eye blink rate. Hearing is intact. Face is symmetric with mild to moderate facial masking and normal facial sensation. There is no lip, neck or jaw tremor. Neck is mildly rigid with intact passive ROM. There are no carotid bruits on auscultation. Oropharynx exam reveals mild  mouth dryness. No significant airway crowding is noted except for mildly enlarged uvula. Mallampati is class II. Tongue protrudes centrally and palate elevates symmetrically. There is no drooling.   Chest: is clear to auscultation without wheezing, rhonchi or crackles noted.  Heart: sounds are regular and normal without murmurs, rubs or gallops noted.   Abdomen: is soft, non-tender and non-distended with normal bowel sounds appreciated on auscultation.  Extremities: There is trace to 1+ pitting edema in the distal lower extremities bilaterally, around both ankles, unchanged.   Skin: is warm and dry with no trophic changes noted. Age-related changes are noted on the skin, he has mild spider veins in the distal lower extremities, no varicose veins.   Musculoskeletal: exam reveals no obvious joint deformities, tenderness, joint swelling or erythema, but he reports mild pain radiating from the R gluteal region to the back to his R thigh.  Neurologically:  Mental status: The patient is awake and alert, paying good  attention. He is able to completely provide the history. He is oriented to: person, place, time/date, situation, day of week, month of year and year. His memory, attention, language and knowledge are intact. There is no aphasia, agnosia, apraxia or anomia. There is no bradyphrenia. Speech is mildly hypophonic with no dysarthria noted. Mood is congruent and affect is normal.   Cranial nerves are as described above under HEENT exam. In addition, shoulder shrug is normal with equal shoulder height noted.  Motor exam: Normal bulk, and strength for age is noted. There are no dyskinesias noted. Tone is mildly rigid with presence of cogwheeling in the right upper extremity. There is overall mild bradykinesia. There is no drift or rebound.  There is a mild resting tremor in the right upper extremity and no other resting tremor. The tremor is intermittent.  Romberg is negative. Reflexes are 1+ in  the upper extremities and 1+ in the lower extremities.   Fine motor skills exam: Finger taps are mild to moderately impaired on the right and mildly impaired on the left. Hand movements are mild  to moderately impaired on the right and not impaired on the left. RAP (rapid alternating patting) is mildly impaired on the right and not impaired on the left. Foot taps are mild to moderately impaired on the right and mildly impaired on the left. Foot agility (in the form of heel stomping) is mildly impaired on the right and not impaired on the left.    Cerebellar testing shows no dysmetria or intention tremor on finger to nose testing. Heel to shin is unremarkable bilaterally. There is no truncal or gait ataxia.   Sensory exam is intact to light touch in the upper and lower extremities.   Gait, station and balance: He stands up from the seated position with mild difficulty and does push up with His hands. He needs no assistance. No veering to one side is noted. He is noted to lean to the right. Posture is mild to moderately stooped, advanced for age. Stance is wide-based. He walks with decrease in stride length and pace and decreased  to near-absent arm swing on the right. He has a slight limp on the right. He turns in 3 steps. Balance is mildly impaired.   Assessment and Plan:   In summary, Antonio [name] is a very pleasant 55 year old male with an otherwise benign medical history, who presents for follow-up consultation of his right-sided predominant Parkinson's disease, with symptoms dating back to 2009. He has fairly stable with his symptoms and has had mild progression in the exam. He has been on Sinemet 3 times a day, Mirapex 1 mg 3 times a day, restarted Azilect successfully recently. He has been on the generic as of most recently. Mood and memory are stable. Exam for the most part is fairly stable. I did try him on long-acting Sinemet at night but he stopped using it after a couple of nights. I would  like him to try it more consistently for at least 4 weeks. He's willing to try this. He has a prescription from before. He did not need any refills on his regular Sinemet, Mirapex her Azilect at this time. I would like to see him back in 4 months, sooner if needed. I answered all his questions today and he was in agreement. I did talk to him again about the importance of physical exercise and keeping well hydrated.  In addition, he reports right-sided sciatica symptoms. I suggested he continue with as needed anti-inflammatory medication and local heat. We will go ahead and schedule him for a lumbar spine MRI and call him with the results.  I spent 20 minutes in total face-to-face time with the patient, more than 50% of which was spent in counseling and coordination of care, reviewing test results, reviewing medication and discussing or reviewing the diagnosis of PD, its prognosis and treatment options.

## 2016-01-13 NOTE — Patient Instructions (Signed)
We will keep your meds the same, try Sinemet CR at bedtime consistently for at least a month.  We will do a lumbar spine MRI for your right sided sciatica and call you with the results.

## 2016-01-22 ENCOUNTER — Ambulatory Visit
Admission: RE | Admit: 2016-01-22 | Discharge: 2016-01-22 | Disposition: A | Discharge status: Home / Self Care | Payer: BLUE CROSS/BLUE SHIELD | Source: Ambulatory Visit | Attending: Neurology | Admitting: Neurology

## 2016-01-22 DIAGNOSIS — G20 Parkinson's disease: Secondary | ICD-10-CM

## 2016-01-22 DIAGNOSIS — M5431 Sciatica, right side: Secondary | ICD-10-CM

## 2016-01-28 ENCOUNTER — Telehealth: Payer: Self-pay

## 2016-01-28 NOTE — Telephone Encounter (Signed)
LM for patient to call back for results

## 2016-01-28 NOTE — Progress Notes (Signed)
Quick Note:  Please call patient re: MRI lumbar spine: no sinister spine disease, mild, multilevel degenerative changes, nothing to explain R sided back pain, L3/4 has mild disc protrusion and nerve canal narrowing towards the left, not right.  Overall, no unexpected findings, nothing warranting surgical eval.  Antonio FoleySaima Kynadie Yaun, MD, PhD [name]  ______

## 2016-01-28 NOTE — Telephone Encounter (Signed)
-----   Message from Huston FoleySaima Athar, MD sent at 01/28/2016  8:27 AM EST ----- Please call patient re: MRI lumbar spine: no sinister spine disease, mild, multilevel degenerative changes, nothing to explain R sided back pain, L3/4 has mild disc protrusion and nerve canal narrowing towards the left, not right.  Overall, no unexpected findings, nothing warranting surgical eval.  Huston FoleySaima Athar, MD, PhD [name]

## 2016-01-30 NOTE — Telephone Encounter (Signed)
LM with results below (per DPR)  

## 2016-01-30 NOTE — Telephone Encounter (Signed)
Patient returned Diana's call °

## 2016-05-12 ENCOUNTER — Encounter: Payer: Self-pay | Admitting: Neurology

## 2016-05-12 ENCOUNTER — Ambulatory Visit (INDEPENDENT_AMBULATORY_CARE_PROVIDER_SITE_OTHER): Payer: BLUE CROSS/BLUE SHIELD | Admitting: Neurology

## 2016-05-12 VITALS — BP 118/72 | HR 76 | Resp 18 | Ht 75.0 in | Wt 254.0 lb

## 2016-05-12 DIAGNOSIS — G20 Parkinson's disease: Secondary | ICD-10-CM | POA: Diagnosis not present

## 2016-05-12 DIAGNOSIS — G2581 Restless legs syndrome: Secondary | ICD-10-CM | POA: Diagnosis not present

## 2016-05-12 DIAGNOSIS — M79651 Pain in right thigh: Secondary | ICD-10-CM | POA: Diagnosis not present

## 2016-05-12 MED ORDER — BACLOFEN 10 MG PO TABS
10.0000 mg | ORAL_TABLET | Freq: Every evening | ORAL | Status: DC | PRN
Start: 2016-05-12 — End: 2017-07-01

## 2016-05-12 MED ORDER — CARBIDOPA-LEVODOPA 25-100 MG PO TABS: 1.0000 | ORAL_TABLET | Freq: Four times a day (QID) | ORAL | Status: DC

## 2016-05-12 NOTE — Patient Instructions (Signed)
To relieve muscle tension and thigh pain, let's try a small dose of baclofen, 10 mg, take one pill at bedtime for now, as needed, see how you feel. We can increase it if needed. Please be mindful, it can make you sleepy.  Try to keep a schedule for your bedtime. Allow for enough sleep time.  We will increase your sinemet to 1 pill 4 times a day: at 7 am, 11 am, 3 pm and 7 pm, keep the Azilect and the Mirapex the same.

## 2016-05-12 NOTE — Progress Notes (Signed)
Subjective:    Patient ID: Antonio [name] is a 55 y.o. male.  HPI    Interim history:   Antonio [name] is a very pleasant 55 year-old right-handed gentleman with an underlying benign medical history, who presents for follow-up consultation of his right-sided predominant Parkinson's disease and his restless leg syndrome. The patient is unaccompanied today. I last saw him on 01/13/16, at which time he reported feeling fairly stable. He did not continue with the CR at night as he did not notice much in the way of difference. He denied any recent restless leg symptoms. He did feel Mirapex was helpful for that. He was taking Sinemet 3 times a day and Mirapex 3 times a day, Azilect 1 mg once daily, which was generic. He reported fall about a week prior to his last appointment. He scraped his knee.he does not always drink enough water. He had some right-sided sciatica symptoms and radiating pain to the back of his thigh.  I ordered an MRI L spine wo contrast, which he had on 01/22/16:  IMPRESSION:  This MRI of the lumbar spine without contrast shows the following: 1.    At L3-L4, there is a midline disc protrusion and mild facet hypertrophy combining to cause borderline spinal stenosis and moderate left lateral recess stenosis there does not appear to be nerve root compression though there is some encroachment upon the traversing left L4 nerve root. 2.   There are milder degenerative changes at L2-L3, L4-L5 and L5-S1 with less potential for nerve root impingement. 3.   There are no acute findings.  We called him with the test results.   Today, 05/12/2016: He reports ongoing issues with his right leg pain in the thigh. Seems to be worse when C/L wears off in between doses. He has at times taken an additional carbidopa-levodopa when he felt he needed it. Generally speaking, he takes his first dose between 7 and 8 AM, the second dose around 11:30 AM, third dose around 4:30. He has not been keeping a very good sleep  schedule, goes to bed late, does not always allow for enough sleep time, has been doing better with fluid intake. Sometimes has word finding issues and difficulty retrieving names but generally speaking mood and memory are fairly good.   Previously:   I saw him on 09/09/2015, at which time he reported that the addition of Azilect was helpful. I suggested he continue with Sinemet tid, Mirapex 1 mg tid, Azilect once daily and try C/L CR at night.  I saw him on 04/24/2015, at which time the patient reported worsening balance and increase in his tremors. Unfortunately, he had fallen recently and fell backwards as he stood up from a chair and fell onto his back after which she felt sore for a couple of days but thankfully did not injure himself seriously. He did not have any head injury or loss of consciousness. He was working 2 jobs. He was not drinking enough water. He was not sleeping as well at night. Memory was stable. He denied significant depressive symptoms. His restless legs symptoms were under control. I suggested she continue with Sinemet and Mirapex, but I asked him to restart Azilect which he had tried in the past. We talked about potentially pursuing DBS surgery in the future.  I first met him on 12/25/2014, at which time he reported improved parkinsonian symptoms with Mirapex and Sinemet. I asked him to continue his medications, Mirapex 1 mg strength one pill 3 times  a day and Sinemet 25-100 milligrams strength one pill 3 times a day. I suggested he could add a half pill of Sinemet as needed in the evenings.   He previously followed with Dr. Jim Like was last seen by him on 07/17/2014, at which time the patient reported having stopped Sinemet and he was using it as needed only. His Mirapex was increased to 1 mg 3 times a day as he also reported residual restless leg symptoms. The addition of Azilect was discussed.  He talked about DBS for future consideration.  Prior to that he was followed  by Dr. Morene Antu and was last seen by Dr. Erling Cruz on 11/25/2011. He was originally seen by Dr. Erling Cruz in November 2009 at which time he presented with right-sided stiffness and fine motor dyscontrol. He has a positive family history of Parkinson's disease in his father. He was initially started on Azilect and Mirapex was added in 2011. He started Sinemet 25-100 milligrams strength one pill 3 times a day and eventually stop the Azilect, due to side effects reported, including cognitive SEs.  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 He is a non-smoker, he drinks wine usually on weekends.   He noted a R hand tremor in the last 6 months. He has work related stress. He sleeps well generally speaking. Confined spaces are more difficulty to negotiate. He has mild memory issues. He has not fallen. He did not do well without the Sinemet and has since then restarted it. He takes it 3 times a day along with one pill of Mirapex. He has not taken his midday dose yet. He has no significant side effects except for mild sleepiness. He has never fallen asleep while driving. He has mild swelling in his feet at times especially on longer plane rides. He has to travel internationally maybe 3 times a year. His medication dose does last for 4 and sometimes 5 hours but at the end of the day especially if he's had a long work day he feels fatigued and more off. He goes to bed late. It is usually between midnight and 1 AM. He has a rise time of 7:30 AM. He snores when he sleeps on his back but does not have any gasping sensations are witnessed apneas as he recalls. His RLS symptoms are under control.    His Past Medical History Is Significant For: Past Medical History  Diagnosis Date  . Leg pain left  . Parkinson's disease ([place])     His Past Surgical History Is Significant For: Past Surgical History  Procedure Laterality Date  . None listed      His Family History Is Significant  For: Family History  Problem Relation Age of Onset  . Heart disease Mother   . Diabetes Father   . Parkinson's disease Father     His Social History Is Significant For: Social History   Social History  . Marital Status: Married    Spouse Name: Antonio [name]  . Number of Children: 3  . Years of Education: Ph.D   Occupational History  .  [employer]   Social History Main Topics  . Smoking status: Never Smoker   . Smokeless tobacco: Never Used  . Alcohol Use: 3.0 oz/week    5 Glasses of wine per week  . Drug Use: No  . Sexual Activity: Not Asked   Other Topics Concern  . None   Social History Narrative  Patient is right handed   Patient lives at home with his wife Antonio [name]. 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   Caffeine- coffee on occasion    Patient has a Ph.D.             His Allergies Are:  No Known Allergies:   His Current Medications Are:  Outpatient Encounter Prescriptions as of 05/12/2016  Medication Sig  . carbidopa-levodopa (SINEMET CR) 50-200 MG tablet Take 1 tablet by mouth at bedtime.  . carbidopa-levodopa (SINEMET IR) 25-100 MG tablet Take 1 tablet by mouth 3 (three) times daily.  . cholecalciferol (VITAMIN D) 1000 UNITS tablet Take 1,000 Units by mouth daily.  Marland Kitchen ibuprofen (ADVIL,MOTRIN) 200 MG tablet Take 200 mg by mouth every 6 (six) hours as needed for pain.  . pramipexole (MIRAPEX) 1 MG tablet Take 1 tablet (1 mg total) by mouth 3 (three) times daily. Taking at 8am, 12pm, and 5 pm  . rasagiline (AZILECT) 1 MG TABS tablet Take 1 tablet (1 mg total) by mouth daily.  . valACYclovir (VALTREX) 1000 MG tablet Take 1,000 mg by mouth as needed.   No facility-administered encounter medications on file as of 05/12/2016.  :  Review of Systems:  Out of a complete 14 point review of systems, all are reviewed and negative with the exception of these symptoms as listed below:   Review of Systems   Neurological:       Patient states that he is still having some pain in the back of his R leg. MRI done since last visit.     Objective:  Neurologic Exam  Physical Exam Physical Examination:   Filed Vitals:   05/12/16 1526  BP: 118/72  Pulse: 76  Resp: 18   General Examination: The patient is a very pleasant 55 y.o. male in no acute distress.  HEENT: Normocephalic, atraumatic, pupils are equal, round and reactive to light and accommodation. Extraocular tracking shows mild saccadic breakdown without nystagmus noted. There is limitation to lower gaze, unchanged. There is mild decrease in eye blink rate. Hearing is intact. Face is symmetric with mild to moderate facial masking and normal facial sensation. There is no lip, neck or jaw tremor. Neck is mildly rigid with intact passive ROM. There are no carotid bruits on auscultation. Oropharynx exam reveals mild mouth dryness. No significant airway crowding is noted except for mildly enlarged uvula. Mallampati is class II. Tongue protrudes centrally and palate elevates symmetrically. There is no drooling.   Chest: is clear to auscultation without wheezing, rhonchi or crackles noted.  Heart: sounds are regular and normal without murmurs, rubs or gallops noted.   Abdomen: is soft, non-tender and non-distended with normal bowel sounds appreciated on auscultation.  Extremities: There is trace edema in the distal lower extremities bilaterally, around both ankles.   Skin: is warm and dry with no trophic changes noted. Age-related changes are noted on the skin, he has mild spider veins in the distal lower extremities, no varicose veins.   Musculoskeletal: exam reveals no obvious joint deformities, tenderness, joint swelling or erythema, but he reports mild pain radiating from the R gluteal region to the back to his R thigh, mild muscle tightness.  Neurologically:  Mental status: The patient is awake and alert, paying good  attention. He is able  to completely provide the history. He is oriented to: person, place, time/date, situation, day of week, month of year and year. His  memory, attention, language and knowledge are intact. There is no aphasia, agnosia, apraxia or anomia. There is no bradyphrenia. Speech is mildly hypophonic with no dysarthria noted. Mood is congruent and affect is normal.   Cranial nerves are as described above under HEENT exam. In addition, shoulder shrug is normal with equal shoulder height noted.  Motor exam: Normal bulk, and strength for age is noted. There are no dyskinesias noted. Tone is mildly rigid with presence of cogwheeling in the right upper extremity. There is overall mild bradykinesia. There is no drift or rebound.  There is a mild resting tremor in the right upper extremity and no other resting tremor. The tremor is intermittent.  Romberg is negative. Reflexes are 1+ in the upper extremities and 1+ in the lower extremities.   Fine motor skills exam: Finger taps are mild to moderately impaired on the right and mildly impaired on the left. Hand movements are mild to moderately impaired on the right and not impaired on the left. RAP (rapid alternating patting) is mildly impaired on the right and not impaired on the left. Foot taps are mild to moderately impaired on the right and mildly impaired on the left. Foot agility (in the form of heel stomping) is mildly impaired on the right and not impaired on the left.    Cerebellar testing shows no dysmetria or intention tremor on finger to nose testing. Heel to shin is unremarkable bilaterally. There is no truncal or gait ataxia.   Sensory exam is intact to light touch in the upper and lower extremities.   Gait, station and balance: He stands up from the seated position with mild difficulty and does push up with His hands. He needs no assistance. No veering to one side is noted. He is noted to lean to the right. Posture is mild to moderately stooped, advanced for  age. Stance is wide-based. He walks with decrease in stride length and pace and decreased  to near-absent arm swing on the right. He has a slight limp on the right, does not pick up foot very well. He turns in 3 steps. Balance is mildly impaired.   Assessment and Plan:   In summary, Antonio [name] is a very pleasant 55 year old male with an otherwise benign medical history, who presents for follow-up consultation of his right-sided predominant Parkinson's disease, with symptoms dating back to 2009. He has done fairly well for the first several years on a combination of Sinemet 3 times a day and Mirapex 3 times a day. He restarted Azilect successfully recently after trying it years ago in 2009 and experiencing cognitive side effects at the time. Mood and memory have been stable, he has some occasional word finding difficulty and name were retrieving difficulty. He works full-time. He tries to stay active, has done better with fluid intake. He does not always allow for enough sleep time he admits and is encouraged to keep a better schedule for his wake time and sleep time routine. His weight has been fluctuating in the mid 240s to mid 250s typically for the past few years. He has had ongoing issues for the past several months with right-sided leg pain, thigh muscles tension and radiating pain in the back of his right leg. We did an MRI without contrast on 01/22/2016 which showed some degenerative changes and possible encroachment upon left L4 nerve root but nothing to explain his right-sided thigh pain. At this juncture, I suggested that he try low-dose baclofen 10 mg strength at  night as needed to see if he can sleep through the night better and have some relief with regards to the right thigh pain. He is agreeable. He is advised with respect to the sedating properties of baclofen. We can potentially increase it if he tolerates it and feels it has helped. As far as his levodopa, I would like to increase this to 1  pill 4 times a day, scheduled out of 4 hourly basis, he is agreeable. To that end, he will take Sinemet 25-100 milligrams strength one pill at 7 AM, 11 AM, 3 PM and 7 PM. We will keep the Azilect and Mirapex the same for now. I will see him back in about 4 months, sooner as needed. He is encouraged to call or email with any interim questions or concerns. I answered all his questions today and he was in agreement. I spent 25 minutes in total face-to-face time with the patient, more than 50% of which was spent in counseling and coordination of care, reviewing test results, reviewing medication and discussing or reviewing the diagnosis of PD, its prognosis and treatment options.

## 2016-06-23 ENCOUNTER — Encounter: Payer: Self-pay | Admitting: Internal Medicine

## 2016-08-18 ENCOUNTER — Ambulatory Visit (AMBULATORY_SURGERY_CENTER): Payer: Self-pay

## 2016-08-18 VITALS — Ht 74.0 in | Wt 260.6 lb

## 2016-08-18 DIAGNOSIS — Z86010 Personal history of colonic polyps: Secondary | ICD-10-CM

## 2016-08-18 NOTE — Progress Notes (Signed)
Per pt, no allergies to soy or egg products.Pt not taking any weight loss meds or using  O2 at home. 

## 2016-08-31 ENCOUNTER — Encounter: Payer: Self-pay | Admitting: Internal Medicine

## 2016-09-08 ENCOUNTER — Ambulatory Visit (AMBULATORY_SURGERY_CENTER): Payer: BLUE CROSS/BLUE SHIELD | Admitting: Internal Medicine

## 2016-09-08 ENCOUNTER — Encounter: Payer: Self-pay | Admitting: Internal Medicine

## 2016-09-08 VITALS — BP 118/73 | HR 60 | Temp 96.6°F | Resp 17 | Ht 74.0 in | Wt 260.0 lb

## 2016-09-08 DIAGNOSIS — Z86010 Personal history of colonic polyps: Secondary | ICD-10-CM

## 2016-09-08 MED ORDER — SODIUM CHLORIDE 0.9 % IV SOLN: 500.0000 mL | INTRAVENOUS | Status: DC

## 2016-09-08 NOTE — Progress Notes (Signed)
To recovery, report to Tyrell, RN, VSS. 

## 2016-09-08 NOTE — Op Note (Signed)
[facility] Patient Name: Antonio [name] Procedure Date: 09/08/2016 10:48 AM MRN: [id number] Endoscopist: Iva Boop , MD Age: 55 Referring MD:  Date of Birth: Jan 29, 1961 Gender: Male Account #: [id number] Procedure:                Colonoscopy Indications:              High risk colon cancer surveillance: Personal                            history of colonic polyps, Last colonoscopy: 2012 Medicines:                Propofol per Anesthesia, Monitored Anesthesia Care Procedure:                Pre-Anesthesia Assessment:                           - Prior to the procedure, a History and Physical                            was performed, and patient medications and                            allergies were reviewed. The patient's tolerance of                            previous anesthesia was also reviewed. The risks                            and benefits of the procedure and the sedation                            options and risks were discussed with the patient.                            All questions were answered, and informed consent                            was obtained. Prior Anticoagulants: The patient has                            taken no previous anticoagulant or antiplatelet                            agents. ASA Grade Assessment: II - A patient with                            mild systemic disease. After reviewing the risks                            and benefits, the patient was deemed in                            satisfactory condition to undergo the procedure.  After obtaining informed consent, the colonoscope                            was passed under direct vision. Throughout the                            procedure, the patient's blood pressure, pulse, and                            oxygen saturations were monitored continuously. The                            Model CF-HQ190L [id number]) scope was introduced                             through the anus and advanced to the the terminal                            ileum, with identification of the appendiceal                            orifice and IC valve. The terminal ileum, ileocecal                            valve, appendiceal orifice, and rectum were                            photographed. The quality of the bowel preparation                            was excellent. The colonoscopy was performed                            without difficulty. The patient tolerated the                            procedure well. The bowel preparation used was                            Miralax. Scope In: 11:01:00 AM Scope Out: 11:17:09 AM Scope Withdrawal Time: 0 hours 10 minutes 50 seconds  Total Procedure Duration: 0 hours 16 minutes 9 seconds  Findings:                 The perianal and digital rectal examinations were                            normal. Pertinent negatives include normal prostate                            (size, shape, and consistency).                           The colon (entire examined portion) appeared normal.  No additional abnormalities were found on                            retroflexion.                           The terminal ileum appeared normal. Complications:            No immediate complications. Estimated blood loss:                            None. Estimated Blood Loss:     Estimated blood loss: none. Impression:               - The entire examined colon is normal.                           - The examined portion of the ileum was normal.                           - No specimens collected.                           - Personal history of colonic polyps. Left sided                            serrated adenoma 2012 - ? traditional serrated                            adenoma Recommendation:           - Repeat colonoscopy in 5 years for surveillance.                           - Patient has a contact number available for                             emergencies. The signs and symptoms of potential                            delayed complications were discussed with the                            patient. Return to normal activities tomorrow.                            Written discharge instructions were provided to the                            patient.                           - Resume previous diet.                           - Continue present medications.                           -  Patient has a contact number available for                            emergencies. The signs and symptoms of potential                            delayed complications were discussed with the                            patient. Return to normal activities tomorrow.                            Written discharge instructions were provided to the                            patient. Iva Booparl E Gessner, MD 09/08/2016 11:28:02 AM This report has been signed electronically.

## 2016-09-08 NOTE — Patient Instructions (Addendum)
   No polyps today   Your next routine colonoscopy should be in 5 years - 2022.  I appreciate the opportunity to care for you. Sabrinia Prien E. Elynore Dolinski, MD, FACG   YOU HAD AN ENDOSCOPIC PROCEDURE TODAY AT THE [facility]:   Refer to the procedure report that was given to you for any specific questions about what was found during the examination.  If the procedure report does not answer your questions, please call your gastroenterologist to clarify.  If you requested that your care partner not be given the details of your procedure findings, then the procedure report has been included in a sealed envelope for you to review at your convenience later.  YOU SHOULD EXPECT: Some feelings of bloating in the abdomen. Passage of more gas than usual.  Walking can help get rid of the air that was put into your GI tract during the procedure and reduce the bloating. If you had a lower endoscopy (such as a colonoscopy or flexible sigmoidoscopy) you may notice spotting of blood in your stool or on the toilet paper. If you underwent a bowel prep for your procedure, you may not have a normal bowel movement for a few days.  Please Note:  You might notice some irritation and congestion in your nose or some drainage.  This is from the oxygen used during your procedure.  There is no need for concern and it should clear up in a day or so.  SYMPTOMS TO REPORT IMMEDIATELY:   Following lower endoscopy (colonoscopy or flexible sigmoidoscopy):  Excessive amounts of blood in the stool  Significant tenderness or worsening of abdominal pains  Swelling of the abdomen that is new, acute  Fever of 100F or higher    For urgent or emergent issues, a gastroenterologist can be reached at any hour by calling [phone].   DIET:  We do recommend a small meal at first, but then you may proceed to your regular diet.  Drink plenty of fluids but you should avoid alcoholic beverages for 24 hours.  ACTIVITY:  You  should plan to take it easy for the rest of today and you should NOT DRIVE or use heavy machinery until tomorrow (because of the sedation medicines used during the test).    FOLLOW UP: Our staff will call the number listed on your records the next business day following your procedure to check on you and address any questions or concerns that you may have regarding the information given to you following your procedure. If we do not reach you, we will leave a message.  However, if you are feeling well and you are not experiencing any problems, there is no need to return our call.  We will assume that you have returned to your regular daily activities without incident.  If any biopsies were taken you will be contacted by phone or by letter within the next 1-3 weeks.  Please call us at [phone] if you have not heard about the biopsies in 3 weeks.    SIGNATURES/CONFIDENTIALITY: You and/or your care partner have signed paperwork which will be entered into your electronic medical record.  These signatures attest to the fact that that the information above on your After Visit Summary has been reviewed and is understood.  Full responsibility of the confidentiality of this discharge information lies with you and/or your care-partner. 

## 2016-09-09 ENCOUNTER — Telehealth: Payer: Self-pay | Admitting: *Deleted

## 2016-09-09 NOTE — Telephone Encounter (Signed)
  Follow up Call-  Call back number 09/08/2016  Post procedure Call Back phone  # [phone]  Permission to leave phone message Yes  Some recent data might be hidden     Patient questions:  Do you have a fever, pain , or abdominal swelling? No. Pain Score  0 *  Have you tolerated food without any problems? Yes.    Have you been able to return to your normal activities? Yes.    Do you have any questions about your discharge instructions: Diet   No. Medications  No. Follow up visit  No.  Do you have questions or concerns about your Care? No.  Actions: * If pain score is 4 or above: No action needed, pain <4.

## 2016-09-09 NOTE — Telephone Encounter (Signed)
No answer, message left for the patient. 

## 2016-09-14 ENCOUNTER — Ambulatory Visit: Payer: BLUE CROSS/BLUE SHIELD | Admitting: Neurology

## 2016-10-19 ENCOUNTER — Telehealth: Payer: Self-pay

## 2016-10-19 DIAGNOSIS — G2581 Restless legs syndrome: Secondary | ICD-10-CM

## 2016-10-19 DIAGNOSIS — G20 Parkinson's disease: Secondary | ICD-10-CM

## 2016-10-19 DIAGNOSIS — G20A1 Parkinson's disease without dyskinesia, without mention of fluctuations: Secondary | ICD-10-CM

## 2016-10-19 MED ORDER — PRAMIPEXOLE DIHYDROCHLORIDE 1 MG PO TABS
1.0000 mg | ORAL_TABLET | Freq: Three times a day (TID) | ORAL | 1 refills | Status: DC
Start: 2016-10-19 — End: 2017-02-16

## 2016-10-19 NOTE — Telephone Encounter (Signed)
Refill request of Pramipexole.

## 2016-12-09 ENCOUNTER — Ambulatory Visit: Payer: BLUE CROSS/BLUE SHIELD | Admitting: Neurology

## 2016-12-11 ENCOUNTER — Telehealth: Payer: Self-pay

## 2016-12-11 NOTE — Telephone Encounter (Signed)
I called the patient, from home, and advised the pt that the office is closed and we will call back to reschedule (due to the snow). Patient voiced understanding.

## 2016-12-11 NOTE — Telephone Encounter (Signed)
LM for patient to call back and r/s (due to snow). [place].

## 2016-12-28 ENCOUNTER — Telehealth: Payer: Self-pay

## 2016-12-28 ENCOUNTER — Other Ambulatory Visit: Payer: Self-pay

## 2016-12-28 DIAGNOSIS — G20 Parkinson's disease: Secondary | ICD-10-CM

## 2016-12-28 MED ORDER — RASAGILINE MESYLATE 1 MG PO TABS: 1.0000 mg | ORAL_TABLET | Freq: Every day | ORAL | 3 refills | Status: DC

## 2016-12-28 NOTE — Telephone Encounter (Signed)
Received a refill request from [facility] asking for a refill on sinemet ER 50-200mg  tablet.  I reviewed pt's medication list. Dr. Frances FurbishAthar discontinued the sinemet ER 50-200mg  tablet on 05/12/2016 because it was ineffective.  Pt's current medication list shows that he is taking sinemet IR 25-100mg  tablet four times daily and this is reflected in Dr. Teofilo PodAthar's last note with the pt.  I called pt. He reports that he still does take sinemet ER 50-200mg  tablet at bedtime and says that Dr. Frances FurbishAthar knows that he does this.  I will send to Dr. Frances FurbishAthar for review.

## 2016-12-31 MED ORDER — CARBIDOPA-LEVODOPA ER 50-200 MG PO TBCR: 1.0000 | EXTENDED_RELEASE_TABLET | Freq: Every day | ORAL | 3 refills | Status: DC

## 2016-12-31 NOTE — Telephone Encounter (Signed)
I reviewed my notes: During the February 2017 appointment he had reported that he tried Sinemet CR at night only for a few nights and since he did not feel much in the way of improvement he had stopped it. He was encouraged during the visit to restart it and take it more consistently. It looks like he has since then been taking the CR at night. I renewed the [facility] for [facility]

## 2017-01-21 HISTORY — PX: KNEE ARTHROSCOPY: SUR90

## 2017-02-16 ENCOUNTER — Encounter: Payer: Self-pay | Admitting: Neurology

## 2017-02-16 ENCOUNTER — Ambulatory Visit (INDEPENDENT_AMBULATORY_CARE_PROVIDER_SITE_OTHER): Payer: BLUE CROSS/BLUE SHIELD | Admitting: Neurology

## 2017-02-16 DIAGNOSIS — G2581 Restless legs syndrome: Secondary | ICD-10-CM

## 2017-02-16 DIAGNOSIS — G20 Parkinson's disease: Secondary | ICD-10-CM

## 2017-02-16 MED ORDER — PRAMIPEXOLE DIHYDROCHLORIDE 1 MG PO TABS
1.0000 mg | ORAL_TABLET | Freq: Three times a day (TID) | ORAL | 3 refills | Status: DC
Start: 2017-02-16 — End: 2018-01-20

## 2017-02-16 NOTE — Progress Notes (Signed)
fSubjective:    Patient ID: Antonio [name] is a 56 y.o. male.  HPI     Interim history:   Antonio [name] is a very pleasant 56 year-old right-handed gentleman with an underlying benign medical history, who presents for follow-up consultation of his right-sided predominant Parkinson's disease and his restless leg syndrome. The patient is unaccompanied today. I last saw him on 05/12/2016, at which time he was reporting right leg pain and thigh pain. This seemed to be worse in between Sinemet doses. He was on Sinemet 3 times a day but was not always keeping a set schedule for it. He was also taking the CR Sinemet at night more consistently. I suggested low-dose baclofen as needed 10 mg strength for his right leg muscle tension and pain. I suggested we continue with Azilect and Mirapex at the current doses but increase his Sinemet to 1 pill 4 times a day at 7, 11, 3 PM and 7 PM. He was also encouraged to take the CR at night.  Today, 02/16/2017 (all dictated new, as well as above notes, some dictation done in note pad or Word, outside of chart, may appear as copied):  He reports doing okay, takes Sinemet IR 3 times a day and CR at night or Sinemet IR 4 times a day without the CR at night typically. He had to have surgery to R knee last knee, he had injured himself coming down a ladder. He had injury about 2 mo ago, and surgery last week. He had to be off the Azilect about 2 weeks prior to surgery and noticed a significant decline in his mobility and speed of movements. He has been able to restart it. He is on hydrocodone as needed for pain. He has been utilizing the baclofen sometimes at night which has been helpful to decrease muscle tension and help him rest better at night. He does not take it every night. He is no longer climbing on ladders. After his knee injury he took a trip to [place] because he had previously planned. He did have more difficulty with mobility especially on the way back, had to utilize a  wheelchair at the airport. Nevertheless, he did have a good trip in [place], reunion with college friends and also saw his sister. He has a follow-up appointment with Dr. Alvan Dame soon for suture removal and recheck after surgery. 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 his lower extremity swelling is about the same, memory is stable, mild forgetfulness, he feels that this is age-appropriate, mild increase in nighttime snacking, mild increase in weight. No issues with gambling or other OCD type changes.    Previously (copied from previous notes for reference):   I saw him on 01/13/16, at which time he reported feeling fairly stable. He did not continue with the CR at night as he did not notice much in the way of difference. He denied any recent restless leg symptoms. He did feel Mirapex was helpful for that. He was taking Sinemet 3 times a day and Mirapex 3 times a day, Azilect 1 mg once daily, which was generic. He reported fall about a week prior to his last appointment. He scraped his knee.he does not always drink enough water. He had some right-sided sciatica symptoms and radiating pain to the back of his thigh.  I  ordered an MRI L spine wo contrast, which he had on 01/22/16:  IMPRESSION:  This MRI of the lumbar spine without contrast shows the following: 1.    At L3-L4, there is a midline disc protrusion and mild facet hypertrophy combining to cause borderline spinal stenosis and moderate left lateral recess stenosis there does not appear to be nerve root compression though there is some encroachment upon the traversing left L4 nerve root. 2.   There are milder degenerative changes at L2-L3, L4-L5 and L5-S1 with less potential for nerve root impingement. 3.   There are no acute findings.   We called him with the test results.    I saw him on  09/09/2015, at which time he reported that the addition of Azilect was helpful. I suggested he continue with Sinemet tid, Mirapex 1 mg tid, Azilect once daily and try C/L CR at night.   I saw him on 04/24/2015, at which time the patient reported worsening balance and increase in his tremors. Unfortunately, he had fallen recently and fell backwards as he stood up from a chair and fell onto his back after which she felt sore for a couple of days but thankfully did not injure himself seriously. He did not have any head injury or loss of consciousness. He was working 2 jobs. He was not drinking enough water. He was not sleeping as well at night. Memory was stable. He denied significant depressive symptoms. His restless legs symptoms were under control. I suggested she continue with Sinemet and Mirapex, but I asked him to restart Azilect which he had tried in the past. We talked about potentially pursuing DBS surgery in the future.   I first met him on 12/25/2014, at which time he reported improved parkinsonian symptoms with Mirapex and Sinemet. I asked him to continue his medications, Mirapex 1 mg strength one pill 3 times a day and Sinemet 25-100 milligrams strength one pill 3 times a day. I suggested he could add a half pill of Sinemet as needed in the evenings.    He previously followed with Dr. Jim Like was last seen by him on 07/17/2014, at which time the patient reported having stopped Sinemet and he was using it as needed only. His Mirapex was increased to 1 mg 3 times a day as he also reported residual restless leg symptoms. The addition of Azilect was discussed.  He talked about DBS for future consideration.   Prior to that he was followed by Dr. Morene Antu and was last seen by Dr. Erling Cruz on 11/25/2011. He was originally seen by Dr. Erling Cruz in November 2009 at which time he presented with right-sided stiffness and fine motor dyscontrol. He has a positive family history of Parkinson's disease in his  father. He was initially started on Azilect and Mirapex was added in 2011. He started Sinemet 25-100 milligrams strength one pill 3 times a day and eventually stop the Azilect, due to side effects reported, including cognitive SEs.   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 He is a non-smoker, he drinks wine usually on weekends.    He noted a R hand tremor in the last 6 months. He has work related stress. He sleeps well generally speaking. Confined spaces are more difficulty to negotiate. He has mild memory issues. He has not fallen. He did not do well without the Sinemet and has since then restarted it. He takes it 3 times a day  along with one pill of Mirapex. He has not taken his midday dose yet. He has no significant side effects except for mild sleepiness. He has never fallen asleep while driving. He has mild swelling in his feet at times especially on longer plane rides. He has to travel internationally maybe 3 times a year. His medication dose does last for 4 and sometimes 5 hours but at the end of the day especially if he's had a long work day he feels fatigued and more off. He goes to bed late. It is usually between midnight and 1 AM. He has a rise time of 7:30 AM. He snores when he sleeps on his back but does not have any gasping sensations are witnessed apneas as he recalls. His RLS symptoms are under control.    The patient's allergies, current medications, family history, past medical history, past social history, past surgical history and problem list were reviewed and updated as appropriate.    His Past Medical History Is Significant For: Past Medical History:  Diagnosis Date  . Hx of colonic polyps 12/10/2010  . Leg pain left  . Parkinson's disease ([place])     His Past Surgical History Is Significant For: Past Surgical History:  Procedure Laterality Date  . COLONOSCOPY    . None listed      His Family History Is Significant For: Family  History  Problem Relation Age of Onset  . Heart disease Mother   . Diabetes Father   . Parkinson's disease Father   . Colon cancer Neg Hx   . Esophageal cancer Neg Hx   . Rectal cancer Neg Hx   . Stomach cancer Neg Hx     His Social History Is Significant For: Social History   Social History  . Marital status: Married    Spouse name: Lanelle Bal  . Number of children: 3  . Years of education: Ph.D   Occupational History  .  [employer]   Social History Main Topics  . Smoking status: Never Smoker  . Smokeless tobacco: Never Used  . Alcohol use 3.0 oz/week    5 Glasses of wine per week     Comment: 2 bottles of wine on week-end.  . Drug use: No  . Sexual activity: Not Asked   Other Topics Concern  . None   Social History Narrative   Patient is right handed   Patient lives at home with his wife Lanelle Bal. 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   Caffeine- coffee on occasion    Patient has a Ph.D.             His Allergies Are:  No Known Allergies:   His Current Medications Are:  Outpatient Encounter Prescriptions as of 02/16/2017  Medication Sig  . baclofen (LIORESAL) 10 MG tablet Take 1 tablet (10 mg total) by mouth at bedtime as needed for muscle spasms.  . carbidopa-levodopa (SINEMET CR) 50-200 MG tablet Take 1 tablet by mouth at bedtime.  . carbidopa-levodopa (SINEMET IR) 25-100 MG tablet Take 1 tablet by mouth 4 (four) times daily. Take at 7 AM, 11 AM, 3 PM, and 7 PM  . cholecalciferol (VITAMIN D) 1000 UNITS tablet Take 1,000 Units by mouth daily.  Marland Kitchen ibuprofen (ADVIL,MOTRIN) 200 MG tablet Take 200 mg by mouth every 6 (six) hours as needed for pain.  . pramipexole (MIRAPEX) 1 MG tablet Take 1 tablet (1 mg total) by mouth 3 (  three) times daily. Taking at 8am, 12pm, and 5 pm  . rasagiline (AZILECT) 1 MG TABS tablet Take 1 tablet (1 mg total) by mouth daily.  . valACYclovir (VALTREX) 1000 MG tablet Take 1,000  mg by mouth as needed.   Facility-Administered Encounter Medications as of 02/16/2017  Medication  . 0.9 %  sodium chloride infusion  :  Review of Systems:  Out of a complete 14 point review of systems, all are reviewed and negative with the exception of these symptoms as listed below: Review of Systems  Neurological:       Pt presents today to follow up on his PD. Pt had an arthroscopic surgery on his knee last Thursday and was off of azilect 2 weeks prior to the surgery. He felt a difference while not taking azilect but is back on it now.    Objective:  Neurologic Exam  Physical Exam Physical Examination:   Vitals:   02/16/17 0830  BP: 121/65  Pulse: (!) 59  Resp: 20   General Examination: The patient is a very pleasant 56 y.o. male in no acute distress. He appears well-developed and well-nourished and well groomed.   HEENT: Normocephalic, atraumatic, pupils are equal, round and reactive to light and accommodation. Extraocular tracking shows mild saccadic breakdown, no nystagmus, he has mild limitation to vertical gaze. He has a decreased eye blink rate and mild to moderate facial masking, mild hypophonia, no dysarthria, oropharynx examination reveals mild to moderate mouth dryness, tongue protrudes centrally and palate elevates symmetrically. There is no drooling. No facial dyskinesias are noted.   Chest: Clear to auscultation without wheezing, rhonchi or crackles noted.  Heart: S1+S2+0, regular and normal without murmurs, rubs or gallops noted.   Abdomen: Soft, non-tender and non-distended with normal bowel sounds appreciated on auscultation.  Extremities: There is 2+ pitting edema in the distal lower extremities bilaterally. Pedal pulses are intact.  Skin: Warm and dry without trophic changes noted.  Musculoskeletal: exam reveals no obvious joint deformities, tenderness or joint swelling or erythema, with the exception of right knee swelling and tenderness noted.    Neurologically:  Mental status: The patient is awake, alert and oriented in all 4 spheres. His immediate and remote memory, attention, language skills and fund of knowledge are appropriate. There is no evidence of aphasia, agnosia, apraxia or anomia. Speech is clear with normal prosody and enunciation. Thought process is linear. Mood is normal and affect is normal.  Cranial nerves II - XII are as described above under HEENT exam. In addition: shoulder shrug is normal with equal shoulder height noted. Motor exam: Normal bulk, and strength are noted, he has mild limitation in right knee range of motion and tenderness. There is increase in tone in both upper extremities, right more than left. There are no dyskinesias. He has no significant resting tremor today. Reflexes are 1+ throughout. However, right knee is not tested today. Fine motor skills are moderately impaired on the right and mild to moderately impaired on the left. He stands with mild difficulty and has to push himself up. He has a limp while walking, he has decreased arm swing bilaterally, right more than left, he walks a little slower, posture is mildly stooped. Tandem walk is not testable today. Balance appears to be otherwise preserved. Sensory exam is intact to light touch in the upper and lower extremities, cerebellar testing shows no cerebellar signs such as dysmetria or intention tremor or gait ataxia.   Assessment and plan:    In  summary, Antonio [name] is a very pleasant 56 y.o.-year old male with a history of Right-sided predominant Parkinson's disease, symptoms dating back to 2009. For several years he has held himself very well with a combination of Sinemet 3 times a day and Mirapex 3 times a day. He stopped Azilect for a while and then restarted it successfully, he noticed in the recent past that Azilect was helpful as he had to come off of it for surgery. He had a injury to his right knee with surgery last week. He is advised to try  to exercise regularly and talked to his orthopedic surgeon about the ability to exercise. 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 I did ask him to avoid climbing ladders or be at [facility]. I asked him to be better hydrated with water. Furthermore, I asked him to be consistent with his Sinemet 4 times a day and the CR at night. Furthermore he will continue with Mirapex but I do notice that his swelling is worse,, he has also reported some increase in unhealthy snacking at night, weight is slightly up. We will continue to monitor, we may have to scale back on the Mirapex for fear of side effects including unhealthy snacking and weight gain and worsening swelling. For now, I will monitor. He has been utilizing baclofen at night as needed for muscle tightness which has been helpful, he can continue with this. We will continue all his medications as is for now and recheck in about 4 months, sooner as needed. I answered all his questions today and he was in agreement. I spent 25 minutes in total face-to-face time with the patient, more than 50% of which was spent in counseling and coordination of care, reviewing test results, reviewing medication and discussing or reviewing the diagnosis of PD, its prognosis and treatment options. Pertinent laboratory and imaging test results that were available during this visit with the patient were reviewed by me and considered in my medical decision making (see chart for details).

## 2017-02-16 NOTE — Patient Instructions (Addendum)
We will keep your meds the same, take Sinemet IR 1 pill 4 times daily, about 4 hourly.  Mirapex 1 pill 3 times a day, Azilect once daily and the Sinemet CR 1 pill at bedtime.  Watch for side effects with the Mirapex: Sedation, sleepiness, nausea, vomiting, and rare side effects are confusion, hallucinations, swelling in legs, and abnormal behaviors, including impulse control problems, which can manifest as excessive eating, obsessions with food or gambling, or hypersexuality.  We will monitor the swelling.   Please exercise on a regular basis, please talk to Dr. Charlann Boxerlin about exercise and maybe PT.

## 2017-04-18 ENCOUNTER — Other Ambulatory Visit: Payer: Self-pay | Admitting: Neurology

## 2017-04-18 DIAGNOSIS — G2581 Restless legs syndrome: Secondary | ICD-10-CM

## 2017-04-18 DIAGNOSIS — G20 Parkinson's disease: Secondary | ICD-10-CM

## 2017-04-28 ENCOUNTER — Telehealth: Payer: Self-pay | Admitting: *Deleted

## 2017-04-28 NOTE — Telephone Encounter (Signed)
Noted, [facility] [place] that [facility] receiving [facility]ills [facility].

## 2017-04-28 NOTE — Telephone Encounter (Signed)
Patient called office to notify RN his medcation has been received.

## 2017-04-28 NOTE — Telephone Encounter (Signed)
Called and LVM for patient. Advised we [facility] [place] [facility] pramipexole. However, we sent refills on 02/16/17 qty 270, 3 refills. I called [facility] and they stated rx was shipped on 04/22/17 and delivered 04/24/17. Wanted to make sure he has medication.  He has 3 refills on file with [facility].  Asked him to call and let me know.

## 2017-06-22 ENCOUNTER — Ambulatory Visit (INDEPENDENT_AMBULATORY_CARE_PROVIDER_SITE_OTHER): Payer: BLUE CROSS/BLUE SHIELD | Admitting: Neurology

## 2017-06-22 ENCOUNTER — Encounter: Payer: Self-pay | Admitting: Neurology

## 2017-06-22 DIAGNOSIS — G20 Parkinson's disease: Secondary | ICD-10-CM | POA: Diagnosis not present

## 2017-06-22 DIAGNOSIS — G2581 Restless legs syndrome: Secondary | ICD-10-CM

## 2017-06-22 MED ORDER — ESCITALOPRAM OXALATE 5 MG PO TABS: 5.0000 mg | ORAL_TABLET | Freq: Every day | ORAL | 5 refills | Status: DC

## 2017-06-22 MED ORDER — CARBIDOPA-LEVODOPA 25-100 MG PO TABS: 1.0000 | ORAL_TABLET | Freq: Every day | ORAL | 3 refills | Status: DC

## 2017-06-22 NOTE — Progress Notes (Signed)
Subjective:    Patient ID: Antonio [name] is a 56 y.o. male.  HPI     Interim history:  Antonio [name] is a very pleasant 56 year-old right-handed gentleman with an underlying benign medical history, who presents for follow-up consultation of his right-sided predominant Parkinson's disease and his restless leg syndrome. The patient is accompanied by his wife today. I last saw him on 02/16/2017, at which time he was on Sinemet 4 times a day and Sinemet CR at bedtime. He had to have interim right knee surgery after an injury. He was still on hydrocodone as needed for pain. 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 Lower extremity swelling was about the same, memory stable, no recent falls. No side effects from his medication. He was off of Azilect for his surgery and was able to restarted without problems after about 2 weeks. I asked him to continue with Mirapex 3 times a day, Sinemet CR bedtime, Sinemet IR 4 times a day and Azilect once daily.  Today, 06/22/2017 (all dictated new, as well as above notes, some dictation done in note pad or Word, outside of chart, may appear as copied):  He reports that his off time is increasing, he feels like he is progressing. He continues to take Azilect once daily, Mirapex 3 times a day, Sinemet CR at bedtime, Sinemet IR is 4 times a day (7, 11, 3 and 6). Per wife, he has fallen several times, about 3-4 times. Thankfully no injuries. He has multiple recent stressors: Big project at [place], busy with a home renovation, and still owns part of the winery. He has more freezing, more stiffness, per wife, he has had more frustration and has been difficult to be around. He denies depression and anxiety, but wife feels vehemently differently. He has severe issues with R knee. He has swelling and pain. No narcotics. May need TKA. Is thinking about a second opinion. Medication seems to wear off about 3 hours into the dosing but does feel it kick in after 30-40  minutes typically. Sometimes he has sudden off rather than wearing off.  The patient's allergies, current medications, family history, past medical history, past social history, past surgical history and problem list were reviewed and updated as appropriate.   Previously (copied from previous notes for reference):   I saw him on 05/12/2016, at which time he was reporting right leg pain and thigh pain. This seemed to be worse in between Sinemet doses. He was on Sinemet 3 times a day but was not always keeping a set schedule for it. He was also taking the CR Sinemet at night more consistently. I suggested low-dose baclofen as needed 10 mg strength for his right leg muscle tension and pain. I suggested we continue with Azilect and Mirapex at the current doses but increase his Sinemet to 1 pill 4 times a day at 7, 11, 3 PM and 7 PM. He was also encouraged to take the CR at night.     I saw him on 01/13/16, at which time he reported feeling fairly stable. He did not continue with the CR at night as he did not notice much in the way of difference. He denied any recent restless leg symptoms. He did feel Mirapex was helpful for that. He was taking Sinemet 3 times a day and Mirapex 3 times a day, Azilect 1 mg once daily, which was generic. He reported fall about a week prior to his last appointment.  He scraped his knee.he does not always drink enough water. He had some right-sided sciatica symptoms and radiating pain to the back of his thigh.  I ordered an MRI L spine wo contrast, which he had on 01/22/16:  IMPRESSION:  This MRI of the lumbar spine without contrast shows the following: 1.    At L3-L4, there is a midline disc protrusion and mild facet hypertrophy combining to cause borderline spinal stenosis and moderate left lateral recess stenosis there does not appear to be nerve root compression though there is some encroachment upon the traversing left L4 nerve root. 2.   There are milder degenerative changes  at L2-L3, L4-L5 and L5-S1 with less potential for nerve root impingement. 3.   There are no acute findings.   We called him with the test results.    I saw him on 09/09/2015, at which time he reported that the addition of Azilect was helpful. I suggested he continue with Sinemet tid, Mirapex 1 mg tid, Azilect once daily and try C/L CR at night.   I saw him on 04/24/2015, at which time the patient reported worsening balance and increase in his tremors. Unfortunately, he had fallen recently and fell backwards as he stood up from a chair and fell onto his back after which she felt sore for a couple of days but thankfully did not injure himself seriously. He did not have any head injury or loss of consciousness. He was working 2 jobs. He was not drinking enough water. He was not sleeping as well at night. Memory was stable. He denied significant depressive symptoms. His restless legs symptoms were under control. I suggested she continue with Sinemet and Mirapex, but I asked him to restart Azilect which he had tried in the past. We talked about potentially pursuing DBS surgery in the future.   I first met him on 12/25/2014, at which time he reported improved parkinsonian symptoms with Mirapex and Sinemet. I asked him to continue his medications, Mirapex 1 mg strength one pill 3 times a day and Sinemet 25-100 milligrams strength one pill 3 times a day. I suggested he could add a half pill of Sinemet as needed in the evenings.    He previously followed with Dr. Jim Like was last seen by him on 07/17/2014, at which time the patient reported having stopped Sinemet and he was using it as needed only. His Mirapex was increased to 1 mg 3 times a day as he also reported residual restless leg symptoms. The addition of Azilect was discussed.  He talked about DBS for future consideration.   Prior to that he was followed by Dr. Morene Antu and was last seen by Dr. Erling Cruz on 11/25/2011. He was originally seen by Dr.  Erling Cruz in November 2009 at which time he presented with right-sided stiffness and fine motor dyscontrol. He has a positive family history of Parkinson's disease in his father. He was initially started on Azilect and Mirapex was added in 2011. He started Sinemet 25-100 milligrams strength one pill 3 times a day and eventually stop the Azilect, due to side effects reported, including cognitive SEs.   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 He is a non-smoker, he drinks wine usually on weekends.    He noted a R hand tremor in the last 6 months. He has work related stress. He sleeps well generally speaking. Confined spaces are more difficulty to negotiate. He has mild  memory issues. He has not fallen. He did not do well without the Sinemet and has since then restarted it. He takes it 3 times a day along with one pill of Mirapex. He has not taken his midday dose yet. He has no significant side effects except for mild sleepiness. He has never fallen asleep while driving. He has mild swelling in his feet at times especially on longer plane rides. He has to travel internationally maybe 3 times a year. His medication dose does last for 4 and sometimes 5 hours but at the end of the day especially if he's had a long work day he feels fatigued and more off. He goes to bed late. It is usually between midnight and 1 AM. He has a rise time of 7:30 AM. He snores when he sleeps on his back but does not have any gasping sensations are witnessed apneas as he recalls. His RLS symptoms are under control.    His Past Medical History Is Significant For: Past Medical History:  Diagnosis Date  . Hx of colonic polyps 12/10/2010  . Leg pain left  . Parkinson's disease ([place])     His Past Surgical History Is Significant For: Past Surgical History:  Procedure Laterality Date  . COLONOSCOPY    . None listed      His Family History Is Significant For: Family History  Problem Relation  Age of Onset  . Heart disease Mother   . Diabetes Father   . Parkinson's disease Father   . Colon cancer Neg Hx   . Esophageal cancer Neg Hx   . Rectal cancer Neg Hx   . Stomach cancer Neg Hx     His Social History Is Significant For: Social History   Social History  . Marital status: Married    Spouse name: Lanelle Bal  . Number of children: 3  . Years of education: Ph.D   Occupational History  .  [employer]   Social History Main Topics  . Smoking status: Never Smoker  . Smokeless tobacco: Never Used  . Alcohol use 3.0 oz/week    5 Glasses of wine per week     Comment: 2 bottles of wine on week-end.  . Drug use: No  . Sexual activity: Not Asked   Other Topics Concern  . None   Social History Narrative   Patient is right handed   Patient lives at home with his wife Lanelle Bal. 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   Caffeine- coffee on occasion    Patient has a Ph.D.             His Allergies Are:  No Known Allergies:   His Current Medications Are:  Outpatient Encounter Prescriptions as of 06/22/2017  Medication Sig  . baclofen (LIORESAL) 10 MG tablet Take 1 tablet (10 mg total) by mouth at bedtime as needed for muscle spasms.  . carbidopa-levodopa (SINEMET CR) 50-200 MG tablet Take 1 tablet by mouth at bedtime.  . carbidopa-levodopa (SINEMET IR) 25-100 MG tablet Take 1 tablet by mouth 4 (four) times daily. Take at 7 AM, 11 AM, 3 PM, and 7 PM  . cholecalciferol (VITAMIN D) 1000 UNITS tablet Take 1,000 Units by mouth daily.  Marland Kitchen ibuprofen (ADVIL,MOTRIN) 800 MG tablet Take 800 mg by mouth every 8 (eight) hours as needed.  . pramipexole (MIRAPEX) 1 MG tablet Take 1 tablet (1 mg total) by mouth 3 (three) times daily.  Taking at 8am, 12pm, and 5 pm  . rasagiline (AZILECT) 1 MG TABS tablet Take 1 tablet (1 mg total) by mouth daily.  . valACYclovir (VALTREX) 1000 MG tablet Take 1,000 mg by mouth as needed.  .  [DISCONTINUED] ibuprofen (ADVIL,MOTRIN) 200 MG tablet Take 200 mg by mouth every 6 (six) hours as needed for pain.   Facility-Administered Encounter Medications as of 06/22/2017  Medication  . 0.9 %  sodium chloride infusion  :  Review of Systems:  Out of a complete 14 point review of systems, all are reviewed and negative with the exception of these symptoms as listed below: Review of Systems  Neurological:       Pt presents today to follow up on his PD. Pt is noticing more down time. Pt feels that his symptoms are progressing more since his last visit.    Objective:  Neurological Exam  Physical Exam Physical Examination:   Vitals:   06/22/17 0919  BP: 113/64  Pulse: 62    General Examination: The patient is a very pleasant 56 y.o. male in no acute distress. He appears well-developed and well-nourished and well groomed.   HEENT: Normocephalic, atraumatic, pupils are equal, round and reactive to light and accommodation. Wears corrective eyeglasses. Extraocular tracking shows mild saccadic breakdown, no nystagmus, he has mild limitation to vertical gaze. He has a decreased eye blink rate and mild to moderate facial masking, mild to moderate hypophonia, no dysarthria, oropharynx examination reveals mild to moderate mouth dryness, tongue protrudes centrally and palate elevates symmetrically. There is no drooling. No facial dyskinesias are noted.   Chest: Clear to auscultation without wheezing, rhonchi or crackles noted.  Heart: S1+S2+0, regular and normal without murmurs, rubs or gallops noted.   Abdomen: Soft, non-tender and non-distended with normal bowel sounds appreciated on auscultation.  Extremities: There is 2+ pitting edema in the distal lower extremities bilaterally, right more than left. Pedal pulses are intact.  Skin: Warm and dry without trophic changes noted.  Musculoskeletal: exam reveals significant right knee swelling, tenderness.   Neurologically:   Mental status: The patient is awake, alert and oriented in all 4 spheres. His immediate and remote memory, attention, language skills and fund of knowledge are appropriate. There is no evidence of aphasia, agnosia, apraxia or anomia. Speech is clear with normal prosody and enunciation. Thought process is linear. Mood is normal and affect is normal.   Cranial nerves II - XII are as described above under HEENT exam. In addition: shoulder shrug is normal with equal shoulder height noted. Motor exam: Normal bulk, and strength are noted, he has limitation in right knee range of motion and tenderness. There is increase in tone in both upper extremities, right more than left. There are no dyskinesias. He has an intermittent mild to at times moderate resting tremor in the right upper extremity. Reflexes are 1+ throughout. However, right knee is not tested today. Fine motor skills are moderately impaired on the right and mild to moderately impaired on the left. He stands with more difficulty today and his posture is more stooped today. He walks with a significant limp on the right, he has decreased arm swing bilaterally, right more than left. Balance appears to be mildly impaired. Sensory exam is intact to light touch in the upper and lower extremities, cerebellar testing shows no cerebellar signs such as dysmetria or intention tremor or gait ataxia.   Assessment and plan:    In summary, YOUNES DEGEORGE is a very pleasant 86-year  old male with An underlying benign medical history who presents for follow-up consultation of his right-sided predominant Parkinson's disease, diagnosis dating back to 2009 and symptoms dating back to about 2008. He remained stable for several years, was doing well on a combination of Sinemet one pill 3 times a day as well as Mirapex 3 times a day. He was off of Azilect for a while and restarted it successfully. Unfortunately, he sustained an injury to his right knee and needed surgery for  this in March 2018. He has had residual problems after his surgery including pain and swelling and may be facing knee replacement surgery in the near future. He has had additional stressors recently including managing his own full-time job as well as the ownership in a [place], large home renovation project lately. On the positive side, his family is very supportive including his 3 children. His wife is worried about his mood disorder including symptoms of depression, mood irritability. He has noticed increase in tremor and stiffness and also wearing off, also sudden of times. Today I suggested several things: I would like for him to increase his Sinemet to 1 pill 5 times a day from his 4 times a day scheduled currently by moving up the dosing interval to 3 hourly starting at 7 AM. He will continue to take Azilect once daily first thing in the morning as well as Mirapex 1 mg 3 times a day. Of note, his lower extremity swelling needs to be monitored, this has become a little worse. His wife also added towards the end of the visit that he has had some obsession with unhealthy snacking and ice cream. We did talk about impulse control disorder in the context of taking a dopamine agonist. He has had some falls. He is reminded not to climb any ladders. Constipation is not an issue currently. Memory is stable. Overall, balance is not too bad and posture is a little worse today, there are some signs of progression even on exam. I would like for him to continue with his Sinemet CR at bedtime. He is advised to work on stress reduction and exercising regularly. Furthermore, I will give him a prescription for low-dose Lexapro to be taken late morning once daily about 2-4 weeks after he has made the adjustment to the Sinemet. Ultimately, we also mutually agreed to pursue a referral for potential DBS surgery, he is agreeable to a consultation with Dr. Linus Mako at [facility]. I placed a referral today.  I would like to see him  back in 3 months, sooner as needed. I answered all their questions today and the patient and his wife were in agreement.  I spent 40 minutes in total face-to-face time with the patient, more than 50% of which was spent in counseling and coordination of care, reviewing test results, reviewing medication and discussing or reviewing the diagnosis of PD, its prognosis and treatment options. Pertinent laboratory and imaging test results that were available during this visit with the patient were reviewed by me and considered in my medical decision making (see chart for details).

## 2017-06-22 NOTE — Patient Instructions (Signed)
As discussed, we are increasing your Sinemet to 1 pill 5 times a day, 3 hourly starting at 7 AM. We will continue with Azilect once daily first thing in the morning, Mirapex 1 mg 3 times a day and Sinemet CR at bedtime.  After about a month, you can consider starting Lexapro low-dose 5 mg strength once daily in the late morning or around lunchtime. This medication is not contraindicated with Azilect although your pharmacist may point out a potential contraindication. Side effects with Lexapro may include dizziness, sleepiness, headache, worsening depression. Please be mindful of potential side effects and let us know if you have any problems.  [facility] also [facility] for you to see Dr. Rubin PayorSiddiqui for DBS evaluation, they [facility], if you don't hear back in the next 2 weeks, call us back and we will inquire what the holdup is.  Please stay well-hydrated and add exercise on a regular basis to your day-to-day routine. Try to avoid stressors or reduce stressors possible.

## 2017-07-01 ENCOUNTER — Other Ambulatory Visit: Payer: Self-pay

## 2017-07-01 DIAGNOSIS — M79651 Pain in right thigh: Secondary | ICD-10-CM

## 2017-07-01 MED ORDER — BACLOFEN 10 MG PO TABS: 10.0000 mg | ORAL_TABLET | Freq: Every evening | ORAL | 3 refills | Status: DC | PRN

## 2017-07-19 ENCOUNTER — Other Ambulatory Visit: Payer: Self-pay

## 2017-07-19 MED ORDER — ESCITALOPRAM OXALATE 5 MG PO TABS: 5.0000 mg | ORAL_TABLET | Freq: Every day | ORAL | 1 refills | Status: DC

## 2017-09-17 NOTE — H&P (Signed)
TOTAL KNEE ADMISSION H&P  Patient is being admitted for right total knee arthroplasty.  Subjective:  Chief Complaint:   Right knee primary OA / pain  HPI: Antonio [name], 56 y.o. male, has a history of pain and functional disability in the right knee due to arthritis and has failed non-surgical conservative treatments for greater than 12 weeks to include NSAID's and/or analgesics, corticosteriod injections, use of assistive devices and activity modification.  Onset of symptoms was gradual, starting >10 years ago with gradually worsening course since that time. The patient noted prior procedures on the knee to include  arthroscopy on the right knee(s).  Patient currently rates pain in the right knee(s) at 10 out of 10 with activity. Patient has night pain, worsening of pain with activity and weight bearing, pain that interferes with activities of daily living, pain with passive range of motion, crepitus and joint swelling.  Patient has evidence of periarticular osteophytes and joint space narrowing by imaging studies.  There is no active infection.   Risks, benefits and expectations were discussed with the patient.  Risks including but not limited to the risk of anesthesia, blood clots, nerve damage, blood vessel damage, failure of the prosthesis, infection and up to and including death.  Patient understand the risks, benefits and expectations and wishes to proceed with surgery.   PCP: Delfin Gant, MD  D/C Plans:       Home   Post-op Meds:       No Rx given  Tranexamic Acid:      To be given - IV  Decadron:      Is to be given  FYI:     ASA  Norco  DME:   Pt already has equipment  PT:   OPPT Rx given    Patient Active Problem List   Diagnosis Date Noted  . Parkinson disease (HCC) 06/23/2013  . RLS (restless legs syndrome) 06/23/2013  . Depression 06/23/2013  . Hx of colonic polyps 12/10/2010   Past Medical History:  Diagnosis Date  . Hx of colonic polyps 12/10/2010  . Leg pain  left  . Parkinson's disease [facility])     Past Surgical History:  Procedure Laterality Date  . COLONOSCOPY    . None listed      Current Facility-Administered Medications  Medication Dose Route Frequency Provider Last Rate Last Dose  . 0.9 %  sodium chloride infusion  500 mL Intravenous Continuous Iva Boop, MD       Current Outpatient Prescriptions  Medication Sig Dispense Refill Last Dose  . baclofen (LIORESAL) 10 MG tablet Take 1 tablet (10 mg total) by mouth at bedtime as needed for muscle spasms. 90 each 3   . carbidopa-levodopa (SINEMET CR) 50-200 MG tablet Take 1 tablet by mouth at bedtime. (Patient taking differently: Take 1 tablet by mouth at bedtime as needed (mobility issues). ) 90 tablet 3 Taking  . carbidopa-levodopa (SINEMET IR) 25-100 MG tablet Take 1 tablet by mouth 5 (five) times daily. Take at 7 AM, 10 AM, 1 PM, 4 PM and 7 PM (Patient taking differently: Take 1 tablet by mouth 4 (four) times daily. ) 450 tablet 3   . escitalopram (LEXAPRO) 5 MG tablet Take 1 tablet (5 mg total) by mouth daily. Take at lunch time. Okay to be on Azilect, no contraindication (Note to pharmacist) 90 tablet 1   . ibuprofen (ADVIL,MOTRIN) 200 MG tablet Take 400 mg by mouth every 8 (eight) hours as needed for moderate  pain.    Taking  . meloxicam (MOBIC) 7.5 MG tablet Take 7.5 mg by mouth daily as needed for pain.     . pramipexole (MIRAPEX) 1 MG tablet Take 1 tablet (1 mg total) by mouth 3 (three) times daily. Taking at 8am, 12pm, and 5 pm 270 tablet 3 Taking  . rasagiline (AZILECT) 1 MG TABS tablet Take 1 tablet (1 mg total) by mouth daily. 270 tablet 3 Taking  . valACYclovir (VALTREX) 1000 MG tablet Take 1,000 mg by mouth as needed (cold sores).    Taking   No Known Allergies  Social History  Substance Use Topics  . Smoking status: Never Smoker  . Smokeless tobacco: Never Used  . Alcohol use 3.0 oz/week    5 Glasses of wine per week     Comment: 2 bottles of wine on week-end.     Family History  Problem Relation Age of Onset  . Heart disease Mother   . Diabetes Father   . Parkinson's disease Father   . Colon cancer Neg Hx   . Esophageal cancer Neg Hx   . Rectal cancer Neg Hx   . Stomach cancer Neg Hx      Review of Systems  Constitutional: Negative.   HENT: Negative.   Eyes: Negative.   Respiratory: Negative.   Cardiovascular: Negative.   Gastrointestinal: Negative.   Genitourinary: Negative.   Musculoskeletal: Positive for joint pain.  Skin: Negative.   Neurological: Negative.   Endo/Heme/Allergies: Negative.   Psychiatric/Behavioral: Positive for depression.    Objective:  Physical Exam  Constitutional: He is oriented to person, place, and time. He appears well-developed.  HENT:  Head: Normocephalic.  Eyes: Pupils are equal, round, and reactive to light.  Neck: Neck supple. No JVD present. No tracheal deviation present. No thyromegaly present.  Cardiovascular: Normal rate, regular rhythm and intact distal pulses.   Respiratory: Effort normal and breath sounds normal. No respiratory distress. He has no wheezes.  GI: Soft. There is no tenderness. There is no guarding.  Musculoskeletal:       Right knee: He exhibits decreased range of motion, swelling and bony tenderness. He exhibits no ecchymosis, no deformity, no laceration and no erythema. Tenderness found.  Lymphadenopathy:    He has no cervical adenopathy.  Neurological: He is alert and oriented to person, place, and time.  Skin: Skin is warm and dry.  Psychiatric: He has a normal mood and affect.      Labs:  Estimated body mass index is 32.35 kg/m as calculated from the following:   Height as of 06/22/17: 6\' 2"  (1.88 m).   Weight as of 06/22/17: 114.3 kg (252 lb).   Imaging Review Plain radiographs demonstrate severe degenerative joint disease of the right knee(s). The overall alignment is neutral. The bone quality appears to be good for age and reported activity  level.  Assessment/Plan:  End stage arthritis, right knee   The patient history, physical examination, clinical judgment of the provider and imaging studies are consistent with end stage degenerative joint disease of the right knee(s) and total knee arthroplasty is deemed medically necessary. The treatment options including medical management, injection therapy arthroscopy and arthroplasty were discussed at length. The risks and benefits of total knee arthroplasty were presented and reviewed. The risks due to aseptic loosening, infection, stiffness, patella tracking problems, thromboembolic complications and other imponderables were discussed. The patient acknowledged the explanation, agreed to proceed with the plan and consent was signed. Patient is being admitted for inpatient  treatment for surgery, pain control, PT, OT, prophylactic antibiotics, VTE prophylaxis, progressive ambulation and ADL's and discharge planning. The patient is planning to be discharged home.      Anastasio Auerbach Izabelle Daus   PA-C  09/17/2017, 10:52 AM

## 2017-09-21 ENCOUNTER — Other Ambulatory Visit (HOSPITAL_COMMUNITY): Payer: Self-pay | Admitting: Emergency Medicine

## 2017-09-21 NOTE — Progress Notes (Signed)
Clearance Dr Penni BombardKendall 09-07-17 on chart  LOV/clearance Dr Cleda ClarksKeever 09-07-17 on chart   CMP, CBCdiff, lipid panel, urinalysis, thyroid panel, urinalysis, PSA

## 2017-09-21 NOTE — Patient Instructions (Addendum)
Jonelle SportsRobert E Deatra RobinsonWurz  09/21/2017   Your procedure is scheduled on: 09-28-17  [facility]     Follow signs to Short Stay on first floor at 515AM    Call this number if you have problems the morning of surgery  [phone]   Remember: ONLY 1 PERSON MAY GO WITH YOU TO SHORT STAY TO GET  READY MORNING OF YOUR SURGERY.  Do not eat food or drink liquids :After Midnight.     Take these medicines the morning of surgery with A SIP OF WATER: carbidopa-levidopa(sinemet), pramipexole(mirapex),                                You may not have any metal on your body including hair pins and              piercings  Do not wear jewelry, make-up, lotions, powders or perfumes, deodorant                         Men may shave face and neck.   Do not bring valuables to the hospital. [facility] IS NOT             RESPONSIBLE   FOR VALUABLES.  Contacts, dentures or bridgework may not be worn into surgery.  Leave suitcase in the car. After surgery it may be brought to your room.                 Please read over the following fact sheets you were given: _____________________________________________________________________            [facility] - Preparing for [facility] Before surgery, you can play an important role.  Because skin is not sterile, your skin needs to be as free of germs as possible.  You can reduce the number of germs on your skin by washing with CHG (chlorahexidine gluconate) soap before surgery.  CHG is an antiseptic cleaner which kills germs and bonds with the skin to continue killing germs even after washing. Please DO NOT use if you have an allergy to CHG or antibacterial soaps.  If your skin becomes reddened/irritated stop using the CHG and inform your nurse when you arrive at Short Stay. Do not shave (including legs and underarms) for at least 48 hours prior to the first CHG shower.  You may shave your face/neck. Please follow these  instructions carefully:  1.  Shower with CHG Soap the night before surgery and the  morning of Surgery.  2.  If you choose to wash your hair, wash your hair first as usual with your  normal  shampoo.  3.  After you shampoo, rinse your hair and body thoroughly to remove the  shampoo.                           4.  Use CHG as you would any other liquid soap.  You can apply chg directly  to the skin and wash                       Gently with a scrungie or clean washcloth.  5.  Apply the CHG Soap to your body ONLY FROM THE NECK DOWN.   Do not use on  face/ open                           Wound or open sores. Avoid contact with eyes, ears mouth and genitals (private parts).                       Wash face,  Genitals (private parts) with your normal soap.             6.  Wash thoroughly, paying special attention to the area where your surgery  will be performed.  7.  Thoroughly rinse your body with warm water from the neck down.  8.  DO NOT shower/wash with your normal soap after using and rinsing off  the CHG Soap.                9.  Pat yourself dry with a clean towel.            10.  Wear clean pajamas.            11.  Place clean sheets on your bed the night of your first shower and do not  sleep with pets. Day of Surgery : Do not apply any lotions/deodorants the morning of surgery.  Please wear clean clothes to the hospital/surgery center.  FAILURE TO FOLLOW THESE INSTRUCTIONS MAY RESULT IN THE CANCELLATION OF YOUR SURGERY PATIENT SIGNATURE_________________________________  NURSE SIGNATURE__________________________________  ________________________________________________________________________   [name]  An incentive spirometer is a tool that can help keep your lungs clear and active. This tool measures how well you are filling your lungs with each breath. Taking long deep breaths may help reverse or decrease the chance of developing breathing (pulmonary) problems (especially  infection) following:  A long period of time when you are unable to move or be active. BEFORE THE PROCEDURE   If the spirometer includes an indicator to show your best effort, your nurse or respiratory therapist will set it to a desired goal.  If possible, sit up straight or lean slightly forward. Try not to slouch.  Hold the incentive spirometer in an upright position. INSTRUCTIONS FOR USE  1. Sit on the edge of your bed if possible, or sit up as far as you can in bed or on a chair. 2. Hold the incentive spirometer in an upright position. 3. Breathe out normally. 4. Place the mouthpiece in your mouth and seal your lips tightly around it. 5. Breathe in slowly and as deeply as possible, raising the piston or the ball toward the top of the column. 6. Hold your breath for 3-5 seconds or for as long as possible. Allow the piston or ball to fall to the bottom of the column. 7. Remove the mouthpiece from your mouth and breathe out normally. 8. Rest for a few seconds and repeat Steps 1 through 7 at least 10 times every 1-2 hours when you are awake. Take your time and take a few normal breaths between deep breaths. 9. The spirometer may include an indicator to show your best effort. Use the indicator as a goal to work toward during each repetition. 10. After each set of 10 deep breaths, practice coughing to be sure your lungs are clear. If you have an incision (the cut made at the time of surgery), support your incision when coughing by placing a pillow or rolled up towels firmly against it. Once you are able to get out of bed, walk around indoors  and cough well. You may stop using the incentive spirometer when instructed by your caregiver.  RISKS AND COMPLICATIONS  Take your time so you do not get dizzy or light-headed.  If you are in pain, you may need to take or ask for pain medication before doing incentive spirometry. It is harder to take a deep breath if you are having pain. AFTER  USE  Rest and breathe slowly and easily.  It can be helpful to keep track of a log of your progress. Your caregiver can provide you with a simple table to help with this. If you are using the spirometer at home, follow these instructions: [place] IF:   You are having difficultly using the spirometer.  You have trouble using the spirometer as often as instructed.  Your pain medication is not giving enough relief while using the spirometer.  You develop fever of 100.5 F (38.1 C) or higher. SEEK IMMEDIATE MEDICAL CARE IF:   You cough up bloody sputum that had not been present before.  You develop fever of 102 F (38.9 C) or greater.  You develop worsening pain at or near the incision site. MAKE SURE YOU:   Understand these instructions.  Will watch your condition.  Will get help right away if you are not doing well or get worse. Document Released: 03/22/2007 Document Revised: 02/01/2012 Document Reviewed: 05/23/2007 Exit[employer] Patient Information 2014 [employer], [place].   ________________________________________________________________________  WHAT IS A BLOOD TRANSFUSION? Blood Transfusion Information  A transfusion is the replacement of blood or some of its parts. Blood is made up of multiple cells which provide different functions.  Red blood cells carry oxygen and are used for blood loss replacement.  White blood cells fight against infection.  Platelets control bleeding.  Plasma helps clot blood.  Other blood products are available for specialized needs, such as hemophilia or other clotting disorders. BEFORE THE TRANSFUSION  Who gives blood for transfusions?   Healthy volunteers who are fully evaluated to make sure their blood is safe. This is blood bank blood. Transfusion therapy is the safest it has ever been in the practice of medicine. Before blood is taken from a donor, a complete history is taken to make sure that person has no history of diseases  nor engages in risky social behavior (examples are intravenous drug use or sexual activity with multiple partners). The donor's travel history is screened to minimize risk of transmitting infections, such as malaria. The donated blood is tested for signs of infectious diseases, such as HIV and hepatitis. The blood is then tested to be sure it is compatible with you in order to minimize the chance of a transfusion reaction. If you or a relative donates blood, this is often done in anticipation of surgery and is not appropriate for emergency situations. It takes many days to process the donated blood. RISKS AND COMPLICATIONS Although transfusion therapy is very safe and saves many lives, the main dangers of transfusion include:   Getting an infectious disease.  Developing a transfusion reaction. This is an allergic reaction to something in the blood you were given. Every precaution is taken to prevent this. The decision to have a blood transfusion has been considered carefully by your caregiver before blood is given. Blood is not given unless the benefits outweigh the risks. AFTER THE TRANSFUSION  Right after receiving a blood transfusion, you will usually feel much better and more energetic. This is especially true if your red blood cells have gotten low (  anemic). The transfusion raises the level of the red blood cells which carry oxygen, and this usually causes an energy increase.  The nurse administering the transfusion will monitor you carefully for complications. HOME CARE INSTRUCTIONS  No special instructions are needed after a transfusion. You may find your energy is better. Speak with your caregiver about any limitations on activity for underlying diseases you may have. SEEK MEDICAL CARE IF:   Your condition is not improving after your transfusion.  You develop redness or irritation at the intravenous (IV) site. SEEK IMMEDIATE MEDICAL CARE IF:  Any of the following symptoms occur over the  next 12 hours:  Shaking chills.  You have a temperature by mouth above 102 F (38.9 C), not controlled by medicine.  Chest, back, or muscle pain.  People around you feel you are not acting correctly or are confused.  Shortness of breath or difficulty breathing.  Dizziness and fainting.  You get a rash or develop hives.  You have a decrease in urine output.  Your urine turns a dark color or changes to pink, red, or brown. Any of the following symptoms occur over the next 10 days:  You have a temperature by mouth above 102 F (38.9 C), not controlled by medicine.  Shortness of breath.  Weakness after normal activity.  The white part of the eye turns yellow (jaundice).  You have a decrease in the amount of urine or are urinating less often.  Your urine turns a dark color or changes to pink, red, or brown. Document Released: 11/06/2000 Document Revised: 02/01/2012 Document Reviewed: 06/25/2008 [facility] Patient Information 2014 [place], [place].  _______________________________________________________________________

## 2017-09-22 ENCOUNTER — Ambulatory Visit (INDEPENDENT_AMBULATORY_CARE_PROVIDER_SITE_OTHER): Payer: BLUE CROSS/BLUE SHIELD | Admitting: Neurology

## 2017-09-22 ENCOUNTER — Encounter: Payer: Self-pay | Admitting: Neurology

## 2017-09-22 ENCOUNTER — Encounter (HOSPITAL_COMMUNITY)
Admission: RE | Admit: 2017-09-22 | Discharge: 2017-09-22 | Disposition: A | Discharge status: Home / Self Care | Payer: BLUE CROSS/BLUE SHIELD | Source: Ambulatory Visit | Attending: Orthopedic Surgery | Admitting: Orthopedic Surgery

## 2017-09-22 ENCOUNTER — Encounter (HOSPITAL_COMMUNITY): Payer: Self-pay

## 2017-09-22 VITALS — BP 110/62 | HR 66 | Ht 74.0 in | Wt 260.5 lb

## 2017-09-22 DIAGNOSIS — M25561 Pain in right knee: Secondary | ICD-10-CM

## 2017-09-22 DIAGNOSIS — M1711 Unilateral primary osteoarthritis, right knee: Secondary | ICD-10-CM | POA: Diagnosis not present

## 2017-09-22 DIAGNOSIS — G8929 Other chronic pain: Secondary | ICD-10-CM | POA: Diagnosis not present

## 2017-09-22 DIAGNOSIS — G20 Parkinson's disease: Secondary | ICD-10-CM | POA: Diagnosis not present

## 2017-09-22 DIAGNOSIS — Z01812 Encounter for preprocedural laboratory examination: Secondary | ICD-10-CM | POA: Insufficient documentation

## 2017-09-22 HISTORY — DX: Dystonia, unspecified: G24.9

## 2017-09-22 LAB — ABO/RH: ABO/RH(D): O POS

## 2017-09-22 LAB — SURGICAL PCR SCREEN
MRSA, PCR: NEGATIVE
Staphylococcus aureus: NEGATIVE

## 2017-09-22 NOTE — Patient Instructions (Addendum)
We will keep your meds the same, please stop the Azilect in preparation of your surgery. You can restart your Azilect once you are no longer on scheduled narcotic pain medicine.  Having Parkinson's disease is not a contraindication for surgery. However, we have to be aware, that patients with Parkinson's disease can have a longer recovery time and some setbacks with their Parkinson's symptoms, typically linked to irregularities in the medication timing, being off of meds during surgery and recovery, and also due to having to take pain medication and secondary to temporarily being more immobile.   We will do a follow-up in about 4 months, soon after you have seen Dr[facility] Rubin Pay[facility]Siddiqui [facility].  Good luck with your surgery!

## 2017-09-22 NOTE — Progress Notes (Signed)
Neurology clearance Dr Frances FurbishAthar on chart

## 2017-09-22 NOTE — Progress Notes (Signed)
Subjective:    Patient ID: Antonio [name] is a 56 y.o. male.  HPI     Interim history:   Antonio [name] is a very pleasant 56 year-old right-handed gentleman with an underlying benign medical history, who presents for follow-up consultation of his right-sided predominant Parkinson's disease and his restless leg syndrome. The patient is unaccompanied today. I last saw him on 06/22/2017, at which time he reported more off time. He felt he was more progressed in his symptoms. His wife reported that he had fallen several times. He had more recent stressors. His wife had noted more freezing. He was more irritable and frustrated easier. He had more issues with his right knee, most likely needing total knee replacement. I suggested we increase his Sinemet to 1 pill 5 times a day and keep his Mirapex 1 mg 3 times a day, Sinemet CR bedtime, and keep his Azilect the same. I suggested an opinion for DBS evaluation at [facility] I also suggested he start taking Lexapro 5 mg strength.   Today, 09/22/2017 (all dictated new, as well as above notes, some dictation done in note pad or Word, outside of chart, may appear as copied):   He reports doing okay, had a fall but was carrying something was walking downhill, was also towards the off time of his medication time at the time. His right knee bothers him quite significantly. He feels like when he is off the knee either locks or gives out. He had interim consultation for his right knee and is planning to undergo right total knee replacement surgery on September 28, 2017. He is scheduled for consultation with Dr. Linus Mako at [facility] on 01/04/2018. I filled out his surgical clearance form for him today per orthopedics request. He is advised to stop the Azilect a week before surgery, Azilect can interfere with narcotic pain medication particularly morphine. He remembers that for his arthroscopic surgery he had to stop it as well about 6 days prior to surgery. He can restart  his Azilect when he is no longer on scheduled narcotic pain medication I explained to him.   The patient's allergies, current medications, family history, past medical history, past social history, past surgical history and problem list were reviewed and updated as appropriate.    Previously (copied from previous notes for reference):    I saw him on 02/16/2017, at which time he was on Sinemet 4 times a day and Sinemet CR at bedtime. He had to have interim right knee surgery after an injury. He was still on hydrocodone as needed for pain. 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 Lower extremity swelling was about the same, memory stable, no recent falls. No side effects from his medication. He was off of Azilect for his surgery and was able to restarted without problems after about 2 weeks. I asked him to continue with Mirapex 3 times a day, Sinemet CR bedtime, Sinemet IR 4 times a day and Azilect once daily.    I saw him on 05/12/2016, at which time he was reporting right leg pain and thigh pain. This seemed to be worse in between Sinemet doses. He was on Sinemet 3 times a day but was not always keeping a set schedule for it. He was also taking the CR Sinemet at night more consistently. I suggested low-dose baclofen as needed 10 mg strength for his right leg muscle tension and pain. I suggested we continue with Azilect and Mirapex at  the current doses but increase his Sinemet to 1 pill 4 times a day at 7, 11, 3 PM and 7 PM. He was also encouraged to take the CR at night.     I saw him on 01/13/16, at which time he reported feeling fairly stable. He did not continue with the CR at night as he did not notice much in the way of difference. He denied any recent restless leg symptoms. He did feel Mirapex was helpful for that. He was taking Sinemet 3 times a day and Mirapex 3 times a day, Azilect 1 mg once daily, which was generic. He reported fall about a week prior to his last  appointment. He scraped his knee.he does not always drink enough water. He had some right-sided sciatica symptoms and radiating pain to the back of his thigh.  I ordered an MRI L spine wo contrast, which he had on 01/22/16:  IMPRESSION:  This MRI of the lumbar spine without contrast shows the following: 1.    At L3-L4, there is a midline disc protrusion and mild facet hypertrophy combining to cause borderline spinal stenosis and moderate left lateral recess stenosis there does not appear to be nerve root compression though there is some encroachment upon the traversing left L4 nerve root. 2.   There are milder degenerative changes at L2-L3, L4-L5 and L5-S1 with less potential for nerve root impingement. 3.   There are no acute findings.   We called him with the test results.    I saw him on 09/09/2015, at which time he reported that the addition of Azilect was helpful. I suggested he continue with Sinemet tid, Mirapex 1 mg tid, Azilect once daily and try C/L CR at night.   I saw him on 04/24/2015, at which time the patient reported worsening balance and increase in his tremors. Unfortunately, he had fallen recently and fell backwards as he stood up from a chair and fell onto his back after which she felt sore for a couple of days but thankfully did not injure himself seriously. He did not have any head injury or loss of consciousness. He was working 2 jobs. He was not drinking enough water. He was not sleeping as well at night. Memory was stable. He denied significant depressive symptoms. His restless legs symptoms were under control. I suggested she continue with Sinemet and Mirapex, but I asked him to restart Azilect which he had tried in the past. We talked about potentially pursuing DBS surgery in the future.   I first met him on 12/25/2014, at which time he reported improved parkinsonian symptoms with Mirapex and Sinemet. I asked him to continue his medications, Mirapex 1 mg strength one pill 3 times  a day and Sinemet 25-100 milligrams strength one pill 3 times a day. I suggested he could add a half pill of Sinemet as needed in the evenings.    He previously followed with Dr. Jim Like was last seen by him on 07/17/2014, at which time the patient reported having stopped Sinemet and he was using it as needed only. His Mirapex was increased to 1 mg 3 times a day as he also reported residual restless leg symptoms. The addition of Azilect was discussed.  He talked about DBS for future consideration.   Prior to that he was followed by Dr. Morene Antu and was last seen by Dr. Erling Cruz on 11/25/2011. He was originally seen by Dr. Erling Cruz in November 2009 at which time he presented with right-sided  stiffness and fine motor dyscontrol. He has a positive family history of Parkinson's disease in his father. He was initially started on Azilect and Mirapex was added in 2011. He started Sinemet 25-100 milligrams strength one pill 3 times a day and eventually stop the Azilect, due to side effects reported, including cognitive SEs.   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 He is a non-smoker, he drinks wine usually on weekends.    He noted a R hand tremor in the last 6 months. He has work related stress. He sleeps well generally speaking. Confined spaces are more difficulty to negotiate. He has mild memory issues. He has not fallen. He did not do well without the Sinemet and has since then restarted it. He takes it 3 times a day along with one pill of Mirapex. He has not taken his midday dose yet. He has no significant side effects except for mild sleepiness. He has never fallen asleep while driving. He has mild swelling in his feet at times especially on longer plane rides. He has to travel internationally maybe 3 times a year. His medication dose does last for 4 and sometimes 5 hours but at the end of the day especially if he's had a long work day he feels fatigued and more off.  He goes to bed late. It is usually between midnight and 1 AM. He has a rise time of 7:30 AM. He snores when he sleeps on his back but does not have any gasping sensations are witnessed apneas as he recalls. His RLS symptoms are under control.    The patient's allergies, current medications, family history, past medical history, past social history, past surgical history and problem list were reviewed and updated as appropriate.    His Past Medical History Is Significant For: Past Medical History:  Diagnosis Date  . Hx of colonic polyps 12/10/2010  . Leg pain left  . Parkinson's disease ([place])     His Past Surgical History Is Significant For: Past Surgical History:  Procedure Laterality Date  . COLONOSCOPY    . None listed      His Family History Is Significant For: Family History  Problem Relation Age of Onset  . Heart disease Mother   . Diabetes Father   . Parkinson's disease Father   . Colon cancer Neg Hx   . Esophageal cancer Neg Hx   . Rectal cancer Neg Hx   . Stomach cancer Neg Hx     His Social History Is Significant For: Social History   Social History  . Marital status: Married    Spouse name: Lanelle Bal  . Number of children: 3  . Years of education: Ph.D   Occupational History  .  [employer]   Social History Main Topics  . Smoking status: Never Smoker  . Smokeless tobacco: Never Used  . Alcohol use 3.0 oz/week    5 Glasses of wine per week     Comment: 2 bottles of wine on week-end.  . Drug use: No  . Sexual activity: Not Asked   Other Topics Concern  . None   Social History Narrative   Patient is right handed   Patient lives at home with his wife Lanelle Bal. 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   Caffeine- coffee on occasion    Patient has a Ph.D.  His Allergies Are:  No Known Allergies:   His Current Medications Are:  Outpatient Encounter Prescriptions as of 09/22/2017   Medication Sig  . baclofen (LIORESAL) 10 MG tablet Take 1 tablet (10 mg total) by mouth at bedtime as needed for muscle spasms.  . carbidopa-levodopa (SINEMET CR) 50-200 MG tablet Take 1 tablet by mouth at bedtime. (Patient taking differently: Take 1 tablet by mouth at bedtime as needed (mobility issues). )  . carbidopa-levodopa (SINEMET IR) 25-100 MG tablet Take 1 tablet by mouth 5 (five) times daily. Take at 7 AM, 10 AM, 1 PM, 4 PM and 7 PM (Patient taking differently: Take 1 tablet by mouth 4 (four) times daily. )  . escitalopram (LEXAPRO) 5 MG tablet Take 1 tablet (5 mg total) by mouth daily. Take at lunch time. Okay to be on Azilect, no contraindication (Note to pharmacist)  . ibuprofen (ADVIL,MOTRIN) 200 MG tablet Take 400 mg by mouth every 8 (eight) hours as needed for moderate pain.   . meloxicam (MOBIC) 7.5 MG tablet Take 7.5 mg by mouth daily as needed for pain.  . pramipexole (MIRAPEX) 1 MG tablet Take 1 tablet (1 mg total) by mouth 3 (three) times daily. Taking at 8am, 12pm, and 5 pm  . rasagiline (AZILECT) 1 MG TABS tablet Take 1 tablet (1 mg total) by mouth daily.  . valACYclovir (VALTREX) 1000 MG tablet Take 1,000 mg by mouth as needed (cold sores).   . [DISCONTINUED] cholecalciferol (VITAMIN D) 1000 UNITS tablet Take 1,000 Units by mouth daily.   Facility-Administered Encounter Medications as of 09/22/2017  Medication  . 0.9 %  sodium chloride infusion  :  Review of Systems:  Out of a complete 14 point review of systems, all are reviewed and negative with the exception of these symptoms as listed below: Review of Systems  Neurological:       Patient states that "things are going as good as they can be." Patient has knee replacement surgery coming up.     Objective:  Neurological Exam  Physical Exam Physical Examination:   Vitals:   09/22/17 0937  BP: 110/62  Pulse: 66   General Examination: The patient is a very pleasant 56 y.o. male in no acute distress. He  appears well-developed and well-nourished and well groomed.   HEENT:Normocephalic, atraumatic, pupils are equal, round and reactive to light and accommodation. Wears corrective eyeglasses. Extraocular tracking shows mild saccadic breakdown, no nystagmus, he has mild limitation to vertical gaze. He has a decreased eye blink rate and mild to moderate facial masking, moderate hypophonia, no dysarthria, oropharynx examination reveals moderate mouth dryness, tongue protrudes centrally and palate elevates symmetrically. There is no drooling. No facial dyskinesias are noted.   Chest:Clear to auscultation without wheezing, rhonchi or crackles noted.  Heart:S1+S2+0, regular and normal without murmurs, rubs or gallops noted.   Abdomen:Soft, non-tender and non-distended with normal bowel sounds appreciated on auscultation.  Extremities:There is 2+pitting edema in the distal lower extremities bilaterally, right more than left. Pedal pulses are intact.  Skin: Warm and dry without trophic changes noted.  Musculoskeletal: exam reveals significant right knee swelling, and tenderness, right foot points outwards when he stands.   Neurologically:  Mental status: The patient is awake, alert and oriented in all 4 spheres. Hisimmediate and remote memory, attention, language skills and fund of knowledge are appropriate. There is no evidence of aphasia, agnosia, apraxia or anomia. Speech is clear with normal prosody and enunciation. Thought process is linear. Mood is normaland affect  is normal.   Cranial nerves II - XII are as described above under HEENT exam.  Motor exam: Normal bulk, and strength are noted, he has limitation in right knee range of motion and tenderness. There is increase in tone in both upper extremities, right more than left. There are no dyskinesias. He has an intermittent mild to at times moderate resting tremor in the right upper extremity. Reflexes are 1+ throughout. However, right  knee is not tested today. Fine motor skills are moderately impaired on the right and mild to moderately impaired on the left. He stands with mild difficulty today and his posture is mild to moderately stooped. He walks with a limp on the right. He did not bring a cane. Balance is mildly impaired. Sensory exam is intact to light touch in the upper and lower extremities, cerebellar testing shows no cerebellar signs such as dysmetria or intention tremor or gait ataxia.   Assessment and plan:   In summary, LUMAN HOLWAY a very pleasant 56 year old malewith an underlying benign medical history who presents for follow-up consultation of his right-sided predominant Parkinson's disease, diagnosis dating back to 2009 and symptoms dating back to about 2008. He remained stable for several years, was doing well on a combination of Sinemet one pill 3 times a day as well as Mirapex 3 times a day. He was off of Azilect for a while and restarted it successfully. We started the CR at night. Unfortunately, he sustained an injury to his right knee and needed surgery for this in March 2018. He has had residual problems after his surgery including pain and swelling and essentially was told that he needed knee replacement surgery. He is scheduled for this for next week. He has already stopped his Azilect in preparation for his surgery, he can restart after he is no longer on scheduled narcotic pain medication. Thankfully, his larger home renovation project is coming to an and. He will be able to use a stationary bike and also a swim spa that he has installed on his large deck. He has been on Sinemet one pill 5 times a day, Mirapex 1 mg 3 times a day. We may have to monitor this in terms of his swelling down the Road. Memory is stable, mood is stable today. He is on low-dose Lexapro as of recently as well. I suggested he keep his appointment in February with Dr. Linus Mako at [facility]. I will see him back in about 4 months,  sooner if needed. I answered all his questions today and he was in agreement.  I spent 25 minutes in total face-to-face time with the patient, more than 50% of which was spent in counseling and coordination of care, reviewing test results, reviewing medication and discussing or reviewing the diagnosis of PD, its prognosis and treatment options. Pertinent laboratory and imaging test results that were available during this visit with the patient were reviewed by me and considered in my medical decision making (see chart for details).

## 2017-09-28 ENCOUNTER — Encounter (HOSPITAL_COMMUNITY)
Admission: RE | Disposition: A | Discharge status: Home / Self Care | Payer: Self-pay | Source: Ambulatory Visit | Attending: Orthopedic Surgery

## 2017-09-28 ENCOUNTER — Ambulatory Visit (HOSPITAL_COMMUNITY): Payer: BLUE CROSS/BLUE SHIELD | Admitting: Registered Nurse

## 2017-09-28 ENCOUNTER — Other Ambulatory Visit: Payer: Self-pay

## 2017-09-28 ENCOUNTER — Observation Stay (HOSPITAL_COMMUNITY)
Admission: RE | Admit: 2017-09-28 | Discharge: 2017-09-29 | Disposition: A | Discharge status: Home / Self Care | Payer: BLUE CROSS/BLUE SHIELD | Source: Ambulatory Visit | Attending: Orthopedic Surgery | Admitting: Orthopedic Surgery

## 2017-09-28 ENCOUNTER — Encounter (HOSPITAL_COMMUNITY): Payer: Self-pay

## 2017-09-28 DIAGNOSIS — G20 Parkinson's disease: Secondary | ICD-10-CM | POA: Diagnosis not present

## 2017-09-28 DIAGNOSIS — Z96651 Presence of right artificial knee joint: Secondary | ICD-10-CM

## 2017-09-28 DIAGNOSIS — F329 Major depressive disorder, single episode, unspecified: Secondary | ICD-10-CM | POA: Insufficient documentation

## 2017-09-28 DIAGNOSIS — M1711 Unilateral primary osteoarthritis, right knee: Secondary | ICD-10-CM | POA: Diagnosis present

## 2017-09-28 DIAGNOSIS — G2581 Restless legs syndrome: Secondary | ICD-10-CM | POA: Diagnosis not present

## 2017-09-28 DIAGNOSIS — M25761 Osteophyte, right knee: Secondary | ICD-10-CM | POA: Insufficient documentation

## 2017-09-28 DIAGNOSIS — Z79899 Other long term (current) drug therapy: Secondary | ICD-10-CM | POA: Diagnosis not present

## 2017-09-28 HISTORY — PX: TOTAL KNEE ARTHROPLASTY: SHX125

## 2017-09-28 LAB — TYPE AND SCREEN
ABO/RH(D): O POS
Antibody Screen: NEGATIVE

## 2017-09-28 SURGERY — ARTHROPLASTY, KNEE, TOTAL
Anesthesia: Spinal | Site: Knee | Laterality: Right

## 2017-09-28 MED ORDER — MAGNESIUM CITRATE PO SOLN
1.0000 | Freq: Once | ORAL | Status: DC | PRN
Start: 2017-09-28 — End: 2017-09-29

## 2017-09-28 MED ORDER — ACETAMINOPHEN 325 MG PO TABS: 650.0000 mg | ORAL_TABLET | ORAL | Status: DC | PRN

## 2017-09-28 MED ORDER — PROPOFOL 500 MG/50ML IV EMUL
INTRAVENOUS | Status: DC | PRN
  Administered 2017-09-28: 100 ug/kg/min via INTRAVENOUS

## 2017-09-28 MED ORDER — DEXAMETHASONE SODIUM PHOSPHATE 10 MG/ML IJ SOLN
10.0000 mg | Freq: Once | INTRAMUSCULAR | Status: AC
  Administered 2017-09-28: 10 mg via INTRAVENOUS

## 2017-09-28 MED ORDER — CEFAZOLIN SODIUM-DEXTROSE 2-4 GM/100ML-% IV SOLN
2.0000 g | Freq: Four times a day (QID) | INTRAVENOUS | Status: AC
  Administered 2017-09-28 (×2): 2 g via INTRAVENOUS
  Filled 2017-09-28 (×2): qty 100

## 2017-09-28 MED ORDER — MIDAZOLAM HCL 5 MG/5ML IJ SOLN
INTRAMUSCULAR | Status: DC | PRN
  Administered 2017-09-28: 2 mg via INTRAVENOUS

## 2017-09-28 MED ORDER — OXYCODONE HCL 5 MG/5ML PO SOLN
5.0000 mg | Freq: Once | ORAL | Status: DC | PRN
  Filled 2017-09-28: qty 5

## 2017-09-28 MED ORDER — OXYCODONE HCL 5 MG PO TABS: 5.0000 mg | ORAL_TABLET | Freq: Once | ORAL | Status: DC | PRN

## 2017-09-28 MED ORDER — PROPOFOL 10 MG/ML IV BOLUS
INTRAVENOUS | Status: AC
  Filled 2017-09-28: qty 20

## 2017-09-28 MED ORDER — HYDROCODONE-ACETAMINOPHEN 7.5-325 MG PO TABS
1.0000 | ORAL_TABLET | ORAL | Status: DC | PRN
  Administered 2017-09-28 – 2017-09-29 (×6): 1 via ORAL
  Filled 2017-09-28 (×5): qty 1

## 2017-09-28 MED ORDER — FENTANYL CITRATE (PF) 100 MCG/2ML IJ SOLN: 50.0000 ug | INTRAMUSCULAR | Status: DC | PRN

## 2017-09-28 MED ORDER — RASAGILINE MESYLATE 1 MG PO TABS: 1.0000 mg | ORAL_TABLET | Freq: Every day | ORAL | Status: DC

## 2017-09-28 MED ORDER — BUPIVACAINE-EPINEPHRINE (PF) 0.25% -1:200000 IJ SOLN
INTRAMUSCULAR | Status: DC | PRN
  Administered 2017-09-28: 30 mL

## 2017-09-28 MED ORDER — FERROUS SULFATE 325 (65 FE) MG PO TABS
325.0000 mg | ORAL_TABLET | Freq: Three times a day (TID) | ORAL | 3 refills | Status: DC
Start: 2017-09-28 — End: 2018-01-20

## 2017-09-28 MED ORDER — DEXAMETHASONE SODIUM PHOSPHATE 10 MG/ML IJ SOLN
10.0000 mg | Freq: Once | INTRAMUSCULAR | Status: AC
  Administered 2017-09-29: 10 mg via INTRAVENOUS
  Filled 2017-09-28: qty 1

## 2017-09-28 MED ORDER — CHLORHEXIDINE GLUCONATE 4 % EX LIQD: 60.0000 mL | Freq: Once | CUTANEOUS | Status: DC

## 2017-09-28 MED ORDER — POLYETHYLENE GLYCOL 3350 17 G PO PACK: 17.0000 g | PACK | Freq: Two times a day (BID) | ORAL | 0 refills | Status: DC

## 2017-09-28 MED ORDER — ESCITALOPRAM OXALATE 10 MG PO TABS
5.0000 mg | ORAL_TABLET | Freq: Every day | ORAL | Status: DC
  Administered 2017-09-29: 11:00:00 5 mg via ORAL
  Filled 2017-09-28: qty 1

## 2017-09-28 MED ORDER — BUPIVACAINE-EPINEPHRINE (PF) 0.25% -1:200000 IJ SOLN
INTRAMUSCULAR | Status: AC
  Filled 2017-09-28: qty 30

## 2017-09-28 MED ORDER — RASAGILINE MESYLATE 1 MG PO TABS
1.0000 mg | ORAL_TABLET | Freq: Every day | ORAL | Status: DC
  Administered 2017-09-28 – 2017-09-29 (×2): 1 mg via ORAL
  Filled 2017-09-28 (×2): qty 1

## 2017-09-28 MED ORDER — BISACODYL 10 MG RE SUPP
10.0000 mg | Freq: Every day | RECTAL | Status: DC | PRN
  Filled 2017-09-28: qty 1

## 2017-09-28 MED ORDER — SODIUM CHLORIDE 0.9 % IV SOLN
1000.0000 mg | INTRAVENOUS | Status: AC
  Administered 2017-09-28: 1000 mg via INTRAVENOUS
  Filled 2017-09-28: qty 1100

## 2017-09-28 MED ORDER — VALACYCLOVIR HCL 500 MG PO TABS
1000.0000 mg | ORAL_TABLET | ORAL | Status: DC | PRN
  Filled 2017-09-28: qty 2

## 2017-09-28 MED ORDER — CELECOXIB 200 MG PO CAPS
200.0000 mg | ORAL_CAPSULE | Freq: Two times a day (BID) | ORAL | Status: DC
  Administered 2017-09-28 – 2017-09-29 (×2): 200 mg via ORAL
  Filled 2017-09-28 (×2): qty 1

## 2017-09-28 MED ORDER — ROPIVACAINE HCL 7.5 MG/ML IJ SOLN
INTRAMUSCULAR | Status: DC | PRN
  Administered 2017-09-28: 20 mL via PERINEURAL

## 2017-09-28 MED ORDER — DOCUSATE SODIUM 100 MG PO CAPS
100.0000 mg | ORAL_CAPSULE | Freq: Two times a day (BID) | ORAL | Status: DC
  Administered 2017-09-28 – 2017-09-29 (×2): 100 mg via ORAL
  Filled 2017-09-28 (×2): qty 1

## 2017-09-28 MED ORDER — BUPIVACAINE IN DEXTROSE 0.75-8.25 % IT SOLN
INTRATHECAL | Status: DC | PRN
  Administered 2017-09-28: 2 mL via INTRATHECAL

## 2017-09-28 MED ORDER — ROCURONIUM BROMIDE 50 MG/5ML IV SOSY
PREFILLED_SYRINGE | INTRAVENOUS | Status: AC
  Filled 2017-09-28: qty 5

## 2017-09-28 MED ORDER — METHOCARBAMOL 1000 MG/10ML IJ SOLN
500.0000 mg | Freq: Four times a day (QID) | INTRAMUSCULAR | Status: DC | PRN
  Administered 2017-09-28: 500 mg via INTRAVENOUS
  Filled 2017-09-28: qty 550

## 2017-09-28 MED ORDER — SODIUM CHLORIDE 0.9 % IV SOLN
INTRAVENOUS | Status: DC
  Administered 2017-09-28 – 2017-09-29 (×2): via INTRAVENOUS

## 2017-09-28 MED ORDER — DOCUSATE SODIUM 100 MG PO CAPS: 100.0000 mg | ORAL_CAPSULE | Freq: Two times a day (BID) | ORAL | 0 refills | Status: DC

## 2017-09-28 MED ORDER — FENTANYL CITRATE (PF) 100 MCG/2ML IJ SOLN
INTRAMUSCULAR | Status: DC | PRN
  Administered 2017-09-28: 100 ug via INTRAVENOUS

## 2017-09-28 MED ORDER — SODIUM CHLORIDE 0.9 % IR SOLN
Status: DC | PRN
  Administered 2017-09-28: 1000 mL

## 2017-09-28 MED ORDER — PROPOFOL 10 MG/ML IV BOLUS
INTRAVENOUS | Status: AC
Start: 2017-09-28 — End: 2017-09-28
  Filled 2017-09-28: qty 20

## 2017-09-28 MED ORDER — ACETAMINOPHEN 650 MG RE SUPP: 650.0000 mg | RECTAL | Status: DC | PRN

## 2017-09-28 MED ORDER — CEFAZOLIN SODIUM-DEXTROSE 2-4 GM/100ML-% IV SOLN
INTRAVENOUS | Status: AC
Start: 2017-09-28 — End: 2017-09-28
  Filled 2017-09-28: qty 100

## 2017-09-28 MED ORDER — TRANEXAMIC ACID 1000 MG/10ML IV SOLN
1000.0000 mg | Freq: Once | INTRAVENOUS | Status: AC
  Administered 2017-09-28: 15:00:00 1000 mg via INTRAVENOUS
  Filled 2017-09-28: qty 1100

## 2017-09-28 MED ORDER — ASPIRIN 81 MG PO CHEW: 81.0000 mg | CHEWABLE_TABLET | Freq: Two times a day (BID) | ORAL | 0 refills | Status: AC

## 2017-09-28 MED ORDER — DIPHENHYDRAMINE HCL 12.5 MG/5ML PO ELIX: 12.5000 mg | ORAL_SOLUTION | ORAL | Status: DC | PRN

## 2017-09-28 MED ORDER — HYDROMORPHONE HCL 1 MG/ML IJ SOLN
INTRAMUSCULAR | Status: AC
  Administered 2017-09-28: 0.5 mg via INTRAVENOUS
  Filled 2017-09-28: qty 1

## 2017-09-28 MED ORDER — POLYETHYLENE GLYCOL 3350 17 G PO PACK
17.0000 g | PACK | Freq: Two times a day (BID) | ORAL | Status: DC
  Filled 2017-09-28: qty 1

## 2017-09-28 MED ORDER — KETOROLAC TROMETHAMINE 30 MG/ML IJ SOLN
INTRAMUSCULAR | Status: DC | PRN
  Administered 2017-09-28: 30 mg

## 2017-09-28 MED ORDER — PHENOL 1.4 % MT LIQD: 1.0000 | OROMUCOSAL | Status: DC | PRN

## 2017-09-28 MED ORDER — HYDROMORPHONE HCL 1 MG/ML IJ SOLN
0.2500 mg | INTRAMUSCULAR | Status: DC | PRN
  Administered 2017-09-28 (×2): 0.5 mg via INTRAVENOUS

## 2017-09-28 MED ORDER — LACTATED RINGERS IV SOLN
INTRAVENOUS | Status: DC
  Administered 2017-09-28: 08:00:00 via INTRAVENOUS
  Administered 2017-09-28: 1000 mL via INTRAVENOUS

## 2017-09-28 MED ORDER — STERILE WATER FOR IRRIGATION IR SOLN
Status: DC | PRN
  Administered 2017-09-28: 3000 mL

## 2017-09-28 MED ORDER — SODIUM CHLORIDE 0.9 % IJ SOLN
INTRAMUSCULAR | Status: AC
Start: 2017-09-28 — End: 2017-09-28
  Filled 2017-09-28: qty 50

## 2017-09-28 MED ORDER — PROPOFOL 10 MG/ML IV BOLUS
INTRAVENOUS | Status: DC | PRN
  Administered 2017-09-28 (×3): 30 mg via INTRAVENOUS

## 2017-09-28 MED ORDER — ALUM & MAG HYDROXIDE-SIMETH 200-200-20 MG/5ML PO SUSP: 15.0000 mL | ORAL | Status: DC | PRN

## 2017-09-28 MED ORDER — DEXAMETHASONE SODIUM PHOSPHATE 10 MG/ML IJ SOLN
INTRAMUSCULAR | Status: AC
  Filled 2017-09-28: qty 1

## 2017-09-28 MED ORDER — ASPIRIN 81 MG PO CHEW
81.0000 mg | CHEWABLE_TABLET | Freq: Two times a day (BID) | ORAL | Status: DC
  Administered 2017-09-28 – 2017-09-29 (×2): 81 mg via ORAL
  Filled 2017-09-28 (×2): qty 1

## 2017-09-28 MED ORDER — HYDROCODONE-ACETAMINOPHEN 7.5-325 MG PO TABS: 1.0000 | ORAL_TABLET | ORAL | 0 refills | Status: DC | PRN

## 2017-09-28 MED ORDER — METHOCARBAMOL 500 MG PO TABS
500.0000 mg | ORAL_TABLET | Freq: Four times a day (QID) | ORAL | Status: DC | PRN
  Administered 2017-09-28 – 2017-09-29 (×2): 500 mg via ORAL
  Filled 2017-09-28 (×2): qty 1

## 2017-09-28 MED ORDER — METOCLOPRAMIDE HCL 5 MG/ML IJ SOLN: 5.0000 mg | Freq: Three times a day (TID) | INTRAMUSCULAR | Status: DC | PRN

## 2017-09-28 MED ORDER — ONDANSETRON HCL 4 MG/2ML IJ SOLN: 4.0000 mg | Freq: Four times a day (QID) | INTRAMUSCULAR | Status: DC | PRN

## 2017-09-28 MED ORDER — MIDAZOLAM HCL 2 MG/2ML IJ SOLN
INTRAMUSCULAR | Status: AC
  Filled 2017-09-28: qty 2

## 2017-09-28 MED ORDER — CARBIDOPA-LEVODOPA 25-100 MG PO TABS
1.0000 | ORAL_TABLET | ORAL | Status: DC
  Administered 2017-09-28 – 2017-09-29 (×6): 1 via ORAL
  Filled 2017-09-28 (×6): qty 1

## 2017-09-28 MED ORDER — MENTHOL 3 MG MT LOZG: 1.0000 | LOZENGE | OROMUCOSAL | Status: DC | PRN

## 2017-09-28 MED ORDER — ONDANSETRON HCL 4 MG PO TABS: 4.0000 mg | ORAL_TABLET | Freq: Four times a day (QID) | ORAL | Status: DC | PRN

## 2017-09-28 MED ORDER — CARBIDOPA-LEVODOPA ER 50-200 MG PO TBCR
1.0000 | EXTENDED_RELEASE_TABLET | Freq: Every evening | ORAL | Status: DC | PRN
  Filled 2017-09-28: qty 1

## 2017-09-28 MED ORDER — HYDROCODONE-ACETAMINOPHEN 7.5-325 MG PO TABS
2.0000 | ORAL_TABLET | ORAL | Status: DC | PRN
  Filled 2017-09-28: qty 2

## 2017-09-28 MED ORDER — PRAMIPEXOLE DIHYDROCHLORIDE 0.25 MG PO TABS
1.0000 mg | ORAL_TABLET | Freq: Three times a day (TID) | ORAL | Status: DC
  Administered 2017-09-28 – 2017-09-29 (×3): 1 mg via ORAL
  Filled 2017-09-28 (×4): qty 4

## 2017-09-28 MED ORDER — MIDAZOLAM HCL 2 MG/2ML IJ SOLN: 1.0000 mg | INTRAMUSCULAR | Status: DC | PRN

## 2017-09-28 MED ORDER — LIDOCAINE 2% (20 MG/ML) 5 ML SYRINGE
INTRAMUSCULAR | Status: AC
  Filled 2017-09-28: qty 10

## 2017-09-28 MED ORDER — HYDROMORPHONE HCL 1 MG/ML IJ SOLN: 0.5000 mg | INTRAMUSCULAR | Status: DC | PRN

## 2017-09-28 MED ORDER — FENTANYL CITRATE (PF) 100 MCG/2ML IJ SOLN
INTRAMUSCULAR | Status: AC
  Filled 2017-09-28: qty 2

## 2017-09-28 MED ORDER — CEFAZOLIN SODIUM-DEXTROSE 2-4 GM/100ML-% IV SOLN
2.0000 g | INTRAVENOUS | Status: AC
  Administered 2017-09-28: 2 g via INTRAVENOUS

## 2017-09-28 MED ORDER — KETOROLAC TROMETHAMINE 30 MG/ML IJ SOLN
INTRAMUSCULAR | Status: AC
Start: 2017-09-28 — End: 2017-09-28
  Filled 2017-09-28: qty 1

## 2017-09-28 MED ORDER — SODIUM CHLORIDE 0.9 % IJ SOLN
INTRAMUSCULAR | Status: DC | PRN
  Administered 2017-09-28: 30 mL

## 2017-09-28 MED ORDER — FERROUS SULFATE 325 (65 FE) MG PO TABS
325.0000 mg | ORAL_TABLET | Freq: Three times a day (TID) | ORAL | Status: DC
  Administered 2017-09-29 (×2): 325 mg via ORAL
  Filled 2017-09-28 (×2): qty 1

## 2017-09-28 MED ORDER — METOCLOPRAMIDE HCL 5 MG PO TABS: 5.0000 mg | ORAL_TABLET | Freq: Three times a day (TID) | ORAL | Status: DC | PRN

## 2017-09-28 SURGICAL SUPPLY — 44 items
BAG DECANTER FOR FLEXI CONT (MISCELLANEOUS) IMPLANT
BAG ZIPLOCK 12X15 (MISCELLANEOUS) IMPLANT
BANDAGE ACE 6X5 VEL STRL LF (GAUZE/BANDAGES/DRESSINGS) ×3 IMPLANT
BLADE SAW SGTL 11.0X1.19X90.0M (BLADE) IMPLANT
BLADE SAW SGTL 13.0X1.19X90.0M (BLADE) ×3 IMPLANT
BOWL SMART MIX CTS (DISPOSABLE) ×3 IMPLANT
CAPT KNEE TOTAL 3 ATTUNE ×3 IMPLANT
CEMENT HV SMART SET (Cement) ×6 IMPLANT
COVER SURGICAL LIGHT HANDLE (MISCELLANEOUS) ×3 IMPLANT
CUFF TOURN SGL QUICK 34 (TOURNIQUET CUFF) ×2
CUFF TRNQT CYL 34X4X40X1 (TOURNIQUET CUFF) ×1 IMPLANT
DECANTER SPIKE VIAL GLASS SM (MISCELLANEOUS) ×3 IMPLANT
DERMABOND ADVANCED (GAUZE/BANDAGES/DRESSINGS) ×2
DERMABOND ADVANCED .7 DNX12 (GAUZE/BANDAGES/DRESSINGS) ×1 IMPLANT
DRAPE U-SHAPE 47X51 STRL (DRAPES) ×3 IMPLANT
DRESSING AQUACEL AG SP 3.5X10 (GAUZE/BANDAGES/DRESSINGS) ×1 IMPLANT
DRSG AQUACEL AG SP 3.5X10 (GAUZE/BANDAGES/DRESSINGS) ×3
DURAPREP 26ML APPLICATOR (WOUND CARE) ×6 IMPLANT
ELECT REM PT RETURN 15FT ADLT (MISCELLANEOUS) ×3 IMPLANT
GLOVE BIOGEL PI IND STRL 7.5 (GLOVE) ×7 IMPLANT
GLOVE BIOGEL PI IND STRL 8.5 (GLOVE) ×1 IMPLANT
GLOVE BIOGEL PI INDICATOR 7.5 (GLOVE) ×14
GLOVE BIOGEL PI INDICATOR 8.5 (GLOVE) ×2
GLOVE ECLIPSE 8.0 STRL XLNG CF (GLOVE) ×3 IMPLANT
GLOVE ORTHO TXT STRL SZ7.5 (GLOVE) ×6 IMPLANT
GOWN STRL REUS W/TWL LRG LVL3 (GOWN DISPOSABLE) ×3 IMPLANT
GOWN STRL REUS W/TWL XL LVL3 (GOWN DISPOSABLE) ×3 IMPLANT
HANDPIECE INTERPULSE COAX TIP (DISPOSABLE) ×2
MANIFOLD NEPTUNE II (INSTRUMENTS) ×3 IMPLANT
PACK TOTAL KNEE CUSTOM (KITS) ×3 IMPLANT
POSITIONER SURGICAL ARM (MISCELLANEOUS) ×3 IMPLANT
SET HNDPC FAN SPRY TIP SCT (DISPOSABLE) ×1 IMPLANT
SET PAD KNEE POSITIONER (MISCELLANEOUS) ×3 IMPLANT
SUT MNCRL AB 4-0 PS2 18 (SUTURE) ×3 IMPLANT
SUT STRATAFIX 0 PDS 27 VIOLET (SUTURE) ×3
SUT VIC AB 1 CT1 36 (SUTURE) ×3 IMPLANT
SUT VIC AB 2-0 CT1 27 (SUTURE) ×6
SUT VIC AB 2-0 CT1 TAPERPNT 27 (SUTURE) ×3 IMPLANT
SUTURE STRATFX 0 PDS 27 VIOLET (SUTURE) ×1 IMPLANT
SYR 50ML LL SCALE MARK (SYRINGE) ×3 IMPLANT
TRAY FOLEY W/METER SILVER 16FR (SET/KITS/TRAYS/PACK) ×3 IMPLANT
WATER STERILE IRR 1000ML POUR (IV SOLUTION) ×6 IMPLANT
WRAP KNEE MAXI GEL POST OP (GAUZE/BANDAGES/DRESSINGS) ×3 IMPLANT
YANKAUER SUCT BULB TIP 10FT TU (MISCELLANEOUS) ×3 IMPLANT

## 2017-09-28 NOTE — Op Note (Signed)
NAME:  [name]                      MEDICAL RECORD NO.:  [id number]                             [facility]      PHYSICIAN:  Madlyn FrankelMatthew D. Charlann Boxerlin, M.D.  DATE OF BIRTH:  17-Jun-1961      DATE OF PROCEDURE:  09/28/2017                                     OPERATIVE REPORT         PREOPERATIVE DIAGNOSIS:  Right knee osteoarthritis.      POSTOPERATIVE DIAGNOSIS:  Right knee osteoarthritis.      FINDINGS:  The patient was noted to have complete loss of cartilage and   bone-on-bone arthritis with associated osteophytes in the medial and patellofemoral compartments of   the knee with a significant synovitis and associated effusion.      PROCEDURE:  Right total knee replacement.      COMPONENTS USED:  DePuy Attune rotating platform posterior stabilized knee   system, a size 8 femur, 8 tibia, size 6 mm AOX PS insert, and 41 anatomic patellar   button.      SURGEON:  Madlyn FrankelMatthew D. Charlann Boxerlin, M.D.      ASSISTANT:  Lanney GinsMatthew Babish, PA-C.      ANESTHESIA:  Regional and Spinal.      SPECIMENS:  None.      COMPLICATION:  None.      DRAINS:  None.  EBL: <150cc      TOURNIQUET TIME:   Total Tourniquet Time Documented: Thigh (Right) - 32 minutes Total: Thigh (Right) - 32 minutes  .      The patient was stable to the recovery room.      INDICATION FOR PROCEDURE:  Antonio [name] is a 56 y.o. male patient of   mine.  The patient had been seen, evaluated, and treated conservatively in the   office with medication, activity modification, and injections.  The patient had   radiographic changes of bone-on-bone arthritis with endplate sclerosis and osteophytes noted.      The patient failed conservative measures including medication, injections, and activity modification, and at this point was ready for more definitive measures.   Based on the radiographic changes and failed conservative measures, the patient   decided to proceed with total knee replacement.  Risks of infection,   DVT,  component failure, need for revision surgery, postop course, and   expectations were all   discussed and reviewed.  Consent was obtained for benefit of pain   relief.      PROCEDURE IN DETAIL:  The patient was brought to the operative theater.   Once adequate anesthesia, preoperative antibiotics, 2 gm of Ancef, 1 gm of Tranexamic Acid, and 10 mg of Decadron administered, the patient was positioned supine with the right thigh tourniquet placed.  The  right lower extremity was prepped and draped in sterile fashion.  A time-   out was performed identifying the patient, planned procedure, and   extremity.      The right lower extremity was [facility] in the [facility] leg holder.  The leg was   exsanguinated, tourniquet elevated to 250 mmHg.  A midline incision was  made followed by median parapatellar arthrotomy.  Following initial   exposure, attention was first directed to the patella.  Precut   measurement was noted to be 26 mm.  I resected down to 14-15 mm and used a   41 anatomic patellar button to restore patellar height as well as cover the cut   surface.      The lug holes were drilled and a metal shim was placed to protect the   patella from retractors and saw blades.      At this point, attention was now directed to the femur.  The femoral   canal was opened with a drill, irrigated to try to prevent fat emboli.  An   intramedullary rod was passed at 5 degrees valgus, 9 mm of bone was   resected off the distal femur.  Following this resection, the tibia was   subluxated anteriorly.  Using the extramedullary guide, 2 mm of bone was resected off   the proximal medial tibia.  We confirmed the gap would be   stable medially and laterally with a size 5 spacer block as well as confirmed   the cut was perpendicular in the coronal plane, checking with an alignment rod.      Once this was done, I sized the femur to be a size 8 in the anterior-   posterior dimension, chose a standard component  based on medial and   lateral dimension.  The size 8 rotation block was then pinned in   position anterior referenced using the C-clamp to set rotation.  The   anterior, posterior, and  chamfer cuts were made without difficulty nor   notching making certain that I was along the anterior cortex to help   with flexion gap stability.      The final box cut was made off the lateral aspect of distal femur.      At this point, the tibia was sized to be a size 8, the size 8 tray was   then pinned in position through the medial third of the tubercle,   drilled, and keel punched.  Trial reduction was now carried with a 8 femur,  8 tibia, a size 5 then 6 mm PS insert, and the 41 anatomic patella botton.  The knee was brought to   extension, full extension with good flexion stability with the patella   tracking through the trochlea without application of pressure.  Given   all these findings the femoral lug holes were drilled and then the trial components removed.  Final components were   opened and cement was mixed.  The knee was irrigated with normal saline   solution and pulse lavage.  The synovial lining was   then injected with 30 cc of 0.25% Marcaine with epinephrine and 1 cc of Toradol plus 30 cc of NS for a total of 61 cc.      The knee was irrigated.  Final implants were then cemented onto clean and   dried cut surfaces of bone with the knee brought to extension with a size 6 mm PS trial insert.      Once the cement had fully cured, the excess cement was removed   throughout the knee.  I confirmed I was satisfied with the range of   motion and stability, and the final size 6 mm PS AOX insert was chosen.  It was   placed into the knee.      The tourniquet had been let  down at 32 minutes.  No significant   hemostasis required.  The   extensor mechanism was then reapproximated using #1 Vicryl and #0 Stratafix sutures with the knee   in flexion.  The   remaining wound was closed with 2-0  Vicryl and running 4-0 Monocryl.   The knee was cleaned, dried, dressed sterilely using Dermabond and   Aquacel dressing.  The patient was then   brought to recovery room in stable condition, tolerating the procedure   well.   Please note that Physician Assistant, Lanney Gins, PA-C, was present for the entirety of the case, and was utilized for pre-operative positioning, peri-operative retractor management, general facilitation of the procedure.  He was also utilized for primary wound closure at the end of the case.              Madlyn Frankel Charlann Boxer, M.D.    09/28/2017 8:42 AM

## 2017-09-28 NOTE — Transfer of Care (Signed)
Immediate Anesthesia Transfer of Care Note  Patient: Antonio [name]  Procedure(s) Performed: RIGHT TOTAL KNEE ARTHROPLASTY (Right Knee)  Patient Location: PACU  Anesthesia Type:Spinal  Level of Consciousness: awake, alert , oriented and patient cooperative  Airway & Oxygen Therapy: Patient Spontanous Breathing and Patient connected to face mask oxygen  Post-op Assessment: Report given to RN and Post -op Vital signs reviewed and stable  Post vital signs: stable  Last Vitals:  Vitals:   09/28/17 0530  BP: 129/75  Pulse: 67  Resp: 18  Temp: 36.8 C  SpO2: 98%    Last Pain:  Vitals:   09/28/17 0530  TempSrc: Oral      Patients Stated Pain Goal: 4 (09/28/17 0603)  Complications: No apparent anesthesia complications

## 2017-09-28 NOTE — Evaluation (Signed)
Physical Therapy Evaluation Patient Details Name: Antonio [name] MRN: [id number] DOB: [birth year] Today's Date: 09/28/2017   History of Present Illness  s/p R TKA; PMHx: Parkinson's  Clinical Impression  Pt is s/p TKA resulting in the deficits listed below (see PT Problem List).  Pt will benefit from skilled PT to increase their independence and safety with mobility to allow discharge to the venue listed below.      Follow Up Recommendations DC plan and follow up therapy as arranged by surgeon    Equipment Recommendations  Rolling walker with 5" wheels    Recommendations for Other Services       Precautions / Restrictions Precautions Precautions: Fall;Knee Restrictions Weight Bearing Restrictions: No Other Position/Activity Restrictions: WBAT      Mobility  Bed Mobility Overal bed mobility: Needs Assistance Bed Mobility: Supine to Sit     Supine to sit: Min assist     General bed mobility comments: assist with RLE  Transfers Overall transfer level: Needs assistance Equipment used: Rolling walker (2 wheeled) Transfers: Sit to/from Stand Sit to Stand: Min assist         General transfer comment: cues for hand placement and RLE position  Ambulation/Gait Ambulation/Gait assistance: Min assist;Min guard Ambulation Distance (Feet): 80 Feet Assistive device: Rolling walker (2 wheeled) Gait Pattern/deviations: Step-to pattern;Antalgic;Trunk flexed     General Gait Details: cues for RW position and trunnk extension  Information systems managertairs            Wheelchair Mobility    Modified Rankin (Stroke Patients Only)       Balance                                             Pertinent Vitals/Pain Pain Assessment: 0-10 Pain Score: 3  Pain Location: right knee Pain Descriptors / Indicators: Sore Pain Intervention(s): Limited activity within patient's tolerance;Monitored during session;Ice applied    Home Living Family/patient expects to be  discharged to:: Private residence Living Arrangements: Spouse/significant other Available Help at Discharge: Family Type of Home: House Home Access: Stairs to enter Entrance Stairs-Rails: Doctor, general practiceight;Left Entrance Stairs-Number of Steps: 3 Home Layout: Multi-level;Bed/bath upstairs;Full bath on main level Home Equipment: Crutches;Cane - single point Additional Comments: "walking sticks"    Prior Function Level of Independence: Independent               Hand Dominance        Extremity/Trunk Assessment   Upper Extremity Assessment Upper Extremity Assessment: Defer to OT evaluation    Lower Extremity Assessment Lower Extremity Assessment: RLE deficits/detail RLE Deficits / Details: grossly 8* to 60* knee flexion AAROM; knee extension and hip flexion 3/5; ankle WFL       Communication   Communication: No difficulties  Cognition Arousal/Alertness: Awake/alert Behavior During Therapy: WFL for tasks assessed/performed Overall Cognitive Status: Within Functional Limits for tasks assessed                                        General Comments      Exercises Total Joint Exercises Ankle Circles/Pumps: AROM;Both;10 reps Quad Sets: AROM;10 reps;Both Heel Slides: AAROM;Right;10 reps   Assessment/Plan    PT Assessment Patient needs continued PT services  PT Problem List Decreased strength;Decreased range of motion;Decreased activity tolerance;Decreased mobility;Decreased knowledge of use  of DME;Decreased knowledge of precautions       PT Treatment Interventions DME instruction;Gait training;Functional mobility training;Therapeutic activities;Therapeutic exercise;Patient/family education    PT Goals (Current goals can be found in the Care Plan section)  Acute Rehab PT Goals Patient Stated Goal: be able to move easier without knee locking  PT Goal Formulation: With patient Time For Goal Achievement: 10/01/17 Potential to Achieve Goals: Good    Frequency  7X/week   Barriers to discharge        Co-evaluation               AM-PAC PT "6 Clicks" Daily Activity  Outcome Measure Difficulty turning over in bed (including adjusting bedclothes, sheets and blankets)?: Unable Difficulty moving from lying on back to sitting on the side of the bed? : Unable Difficulty sitting down on and standing up from a chair with arms (e.g., wheelchair, bedside commode, etc,.)?: Unable Help needed moving to and from a bed to chair (including a wheelchair)?: A Little Help needed walking in hospital room?: A Little Help needed climbing 3-5 steps with a railing? : A Lot 6 Click Score: 11    End of Session Equipment Utilized During Treatment: Gait belt Activity Tolerance: Patient tolerated treatment well Patient left: in chair;with call bell/phone within reach;with family/visitor present Nurse Communication: Mobility status PT Visit Diagnosis: Difficulty in walking, not elsewhere classified (R26.2)    Time: [id number] PT Time Calculation (min) (ACUTE ONLY): 26 min   Charges:   PT Evaluation $PT Eval Low Complexity: 1 Low PT Treatments $Gait Training: 8-22 mins   PT G Codes:   PT G-Codes **NOT FOR INPATIENT CLASS** Functional Assessment Tool Used: AM-PAC 6 Clicks Basic Mobility;Clinical judgement Functional Limitation: Mobility: Walking and moving around Mobility: Walking and Moving Around Current Status ([id number](G8978): At least 20 percent but less than 40 percent impaired, limited or restricted Mobility: Walking and Moving Around Goal Status [phone]): At least 1 percent but less than 20 percent impaired, limited or restricted    Antonio [name], PT Pager: [phone] 09/28/2017   [facility] CityWILLIAMS,Jakori [name] 09/28/2017, 5:12 PM

## 2017-09-28 NOTE — Anesthesia Postprocedure Evaluation (Signed)
Anesthesia Post Note  Patient: Antonio AlphaRobert E [name]  Procedure(s) Performed: RIGHT TOTAL KNEE ARTHROPLASTY (Right Knee)     Patient location during evaluation: PACU Anesthesia Type: Spinal Level of consciousness: awake and alert Pain management: pain level controlled Vital Signs Assessment: post-procedure vital signs reviewed and stable Respiratory status: spontaneous breathing and respiratory function stable Cardiovascular status: blood pressure returned to baseline and stable Postop Assessment: spinal receding Anesthetic complications: no    Last Vitals:  Vitals:   09/28/17 1045 09/28/17 1100  BP: 121/78 128/75  Pulse: 61 61  Resp: 13 14  Temp:  36.4 C  SpO2: 100% 99%    Last Pain:  Vitals:   09/28/17 1045  TempSrc:   PainSc: 2                  Lewie LoronJohn Hobson Lax

## 2017-09-28 NOTE — Anesthesia Procedure Notes (Signed)
Procedure Name: Intubation Date/Time: 09/28/2017 7:13 AM Performed by: Illene SilverEvans, Broghan Pannone E, CRNA Pre-anesthesia Checklist: Patient identified, Emergency Drugs available, Suction available and Patient being monitored Patient Re-evaluated:Patient Re-evaluated prior to induction Oxygen Delivery Method: Simple face mask Airway Equipment and Method: Oral airway Placement Confirmation: positive ETCO2

## 2017-09-28 NOTE — Anesthesia Preprocedure Evaluation (Addendum)
Anesthesia Evaluation  Patient identified by MRN, date of birth, ID band Patient awake    Reviewed: Allergy & Precautions, NPO status , Patient's Chart, lab work & pertinent test results  Airway Mallampati: II  TM Distance: >3 FB Neck ROM: Full    Dental no notable dental hx.    Pulmonary neg pulmonary ROS,    Pulmonary exam normal breath sounds clear to auscultation       Cardiovascular negative cardio ROS Normal cardiovascular exam Rhythm:Regular Rate:Normal     Neuro/Psych PSYCHIATRIC DISORDERS Depression Parkinson's disease negative neurological ROS     GI/Hepatic negative GI ROS, Neg liver ROS,   Endo/Other  negative endocrine ROS  Renal/GU negative Renal ROS     Musculoskeletal negative musculoskeletal ROS (+)   Abdominal   Peds  Hematology negative hematology ROS (+)   Anesthesia Other Findings   Reproductive/Obstetrics negative OB ROS                             Anesthesia Physical Anesthesia Plan  ASA: II  Anesthesia Plan: Spinal   Post-op Pain Management:  Regional for Post-op pain   Induction: Intravenous  PONV Risk Score and Plan: 2 and Ondansetron, Dexamethasone, Midazolam and Propofol infusion  Airway Management Planned:   Additional Equipment:   Intra-op Plan:   Post-operative Plan:   Informed Consent: I have reviewed the patients History and Physical, chart, labs and discussed the procedure including the risks, benefits and alternatives for the proposed anesthesia with the patient or authorized representative who has indicated his/her understanding and acceptance.   Dental advisory given  Plan Discussed with: CRNA  Anesthesia Plan Comments:        Anesthesia Quick Evaluation

## 2017-09-28 NOTE — Plan of Care (Signed)
Postop education started with patient upon arrival to unit. Will cont to monitor.

## 2017-09-28 NOTE — Anesthesia Procedure Notes (Signed)
Spinal  Patient location during procedure: OR End time: 09/28/2017 7:20 AM Staffing Resident/CRNA: Lissa Morales, CRNA Performed: anesthesiologist and resident/CRNA  Preanesthetic Checklist Completed: patient identified, site marked, surgical consent, pre-op evaluation, timeout performed, IV checked, risks and benefits discussed and monitors and equipment checked Spinal Block Patient position: sitting Prep: ChloraPrep Patient monitoring: continuous pulse ox and blood pressure Approach: midline Location: L3-4 Injection technique: single-shot Needle Needle type: Pencan  Needle gauge: 24 G Needle length: 9 cm Assessment Sensory level: T6 Additional Notes Expiration date of kit checked and confirmed. Patient tolerated procedure well, without complications.

## 2017-09-28 NOTE — Discharge Instructions (Signed)

## 2017-09-28 NOTE — Interval H&P Note (Signed)
History and Physical Interval Note:  09/28/2017 6:45 AM  Antonio [name] E Antonio [name]  has presented today for surgery, with the diagnosis of Right knee osteoarthritis  The various methods of treatment have been discussed with the patient and family. After consideration of risks, benefits and other options for treatment, the patient has consented to  Procedure(s) with comments: RIGHT TOTAL KNEE ARTHROPLASTY (Right) - 70 mins as a surgical intervention .  The patient's history has been reviewed, patient examined, no change in status, stable for surgery.  I have reviewed the patient's chart and labs.  Questions were answered to the patient's satisfaction.     Shelda PalLIN,Nickolaos Brallier D

## 2017-09-28 NOTE — Anesthesia Procedure Notes (Signed)
Anesthesia Regional Block: Adductor canal block   Pre-Anesthetic Checklist: ,, timeout performed, Correct Patient, Correct Site, Correct Laterality, Correct Procedure, Correct Position, site marked, Risks and benefits discussed,  Surgical consent,  Pre-op evaluation,  At surgeon's request and post-op pain management  Laterality: Right  Prep: chloraprep       Needles:  Injection technique: Single-shot  Needle Type: Stimiplex     Needle Length: 9cm  Needle Gauge: 21     Additional Needles:   Procedures:,,,, ultrasound used (permanent image in chart),,,,  Narrative:  Start time: 09/28/2017 7:04 AM End time: 09/28/2017 7:07 AM Injection made incrementally with aspirations every 5 mL.  Performed by: Personally  Anesthesiologist: Lewie LoronGermeroth, Nayden Czajka, MD  Additional Notes: BP cuff, EKG monitors applied. Sedation begun. Artery and nerve location verified with U/S and anesthetic injected incrementally, slowly, and after negative aspirations under direct u/s guidance. Good fascial /perineural spread. Tolerated well.

## 2017-09-28 NOTE — Addendum Note (Signed)
Addendum  created 09/28/17 1204 by Lewie LoronGermeroth, Velton Roselle, MD   Child order released for a procedure order, Intraprocedure Blocks edited, Intraprocedure Meds edited, Sign clinical note

## 2017-09-29 ENCOUNTER — Encounter (HOSPITAL_COMMUNITY): Payer: Self-pay | Admitting: Orthopedic Surgery

## 2017-09-29 DIAGNOSIS — M1711 Unilateral primary osteoarthritis, right knee: Secondary | ICD-10-CM | POA: Diagnosis not present

## 2017-09-29 LAB — CBC
HCT: 39.1 % (ref 39.0–52.0)
Hemoglobin: 12.7 g/dL — ABNORMAL LOW (ref 13.0–17.0)
MCH: 29.6 pg (ref 26.0–34.0)
MCHC: 32.5 g/dL (ref 30.0–36.0)
MCV: 91.1 fL (ref 78.0–100.0)
PLATELETS: 224 10*3/uL (ref 150–400)
RBC: 4.29 MIL/uL (ref 4.22–5.81)
RDW: 13.6 % (ref 11.5–15.5)
WBC: 14.4 10*3/uL — ABNORMAL HIGH (ref 4.0–10.5)

## 2017-09-29 LAB — BASIC METABOLIC PANEL
Anion gap: 7 (ref 5–15)
BUN: 17 mg/dL (ref 6–20)
CALCIUM: 8.8 mg/dL — AB (ref 8.9–10.3)
CO2: 28 mmol/L (ref 22–32)
CREATININE: 0.92 mg/dL (ref 0.61–1.24)
Chloride: 103 mmol/L (ref 101–111)
GFR calc Af Amer: >60 mL/min (ref >60)
GLUCOSE: 146 mg/dL — AB (ref 65–99)
POTASSIUM: 3.9 mmol/L (ref 3.5–5.1)
SODIUM: 138 mmol/L (ref 135–145)

## 2017-09-29 NOTE — Plan of Care (Signed)
Discharge instructions given to patient.

## 2017-09-29 NOTE — Evaluation (Signed)
Occupational Therapy Evaluation Patient Details Name: Antonio AlphaRobert E [name] MRN: [id number] DOB: 1961/05/07 Today's Date: 09/29/2017    History of Present Illness s/p R TKA; PMHx: Parkinson's   Clinical Impression   This 56 year old man was admitted for the above.  All education was completed. No further OT is needed at this time    Follow Up Recommendations  Supervision/Assistance - 24 hour    Equipment Recommendations  None recommended by OT(has high toilet and toilet riser)    Recommendations for Other Services       Precautions / Restrictions Precautions Precautions: Fall;Knee Restrictions Other Position/Activity Restrictions: WBAT      Mobility Bed Mobility         Supine to sit: Min assist     General bed mobility comments: assist with RLE  Transfers   Equipment used: Rolling walker (2 wheeled)   Sit to Stand: Min guard;Min assist         General transfer comment: min guard from high bed and min A from high commode    Balance                                           ADL either performed or assessed with clinical judgement   ADL Overall ADL's : Needs assistance/impaired Eating/Feeding: Independent   Grooming: Min guard;Standing   Upper Body Bathing: Set up;Sitting   Lower Body Bathing: Minimal assistance;Sit to/from stand   Upper Body Dressing : Minimal assistance;Sitting   Lower Body Dressing: Maximal assistance;Sit to/from stand   Toilet Transfer: Minimal assistance;Ambulation;Comfort height toilet;RW   Toileting- Clothing Manipulation and Hygiene: Minimal assistance;Sit to/from stand         General ADL Comments: pt had just had Parkinsons medicine.  R tremor noted.  Pt was able to sign name.  He was able to get up from bed with min guard, but min A (light needed from comfort height commode).  His shower is totally accessible:  he can walk right in. Cues for sequence and walker distance when walking to bathroom      Vision         Perception     Praxis      Pertinent Vitals/Pain Pain Score: 3  Pain Location: right knee Pain Descriptors / Indicators: Sore Pain Intervention(s): Limited activity within patient's tolerance;Monitored during session;Premedicated before session;Repositioned;Ice applied     Hand Dominance     Extremity/Trunk Assessment Upper Extremity Assessment Upper Extremity Assessment: (strength functional.  RUE with pin rolling tremors:  just ha)           Communication Communication Communication: No difficulties   Cognition Arousal/Alertness: Awake/alert Behavior During Therapy: WFL for tasks assessed/performed Overall Cognitive Status: Within Functional Limits for tasks assessed                                     General Comments       Exercises     Shoulder Instructions      Home Living Family/patient expects to be discharged to:: Private residence Living Arrangements: Spouse/significant other Available Help at Discharge: Family               Bathroom Shower/Tub: Walk-in Soil scientistshower   Bathroom Toilet: Handicapped height     Home Equipment: Grab bars - toilet;Grab bars - tub/shower;Shower  seat - built in   Additional Comments: pt just had bathroom updated      Prior Functioning/Environment Level of Independence: Independent                 OT Problem List:        OT Treatment/Interventions:      OT Goals(Current goals can be found in the care plan section) Acute Rehab OT Goals Patient Stated Goal: be able to move easier without knee locking  OT Goal Formulation: All assessment and education complete, DC therapy  OT Frequency:     Barriers to D/C:            Co-evaluation              AM-PAC PT "6 Clicks" Daily Activity     Outcome Measure Help from another person eating meals?: None Help from another person taking care of personal grooming?: A Little Help from another person toileting, which  includes using toliet, bedpan, or urinal?: A Little Help from another person bathing (including washing, rinsing, drying)?: A Little Help from another person to put on and taking off regular upper body clothing?: A Little Help from another person to put on and taking off regular lower body clothing?: A Lot 6 Click Score: 18   End of Session    Activity Tolerance: Patient tolerated treatment well Patient left: in chair;with call bell/phone within reach  OT Visit Diagnosis: Pain Pain - Right/Left: Right Pain - part of body: Knee                Time: 1046-1110 OT Time Calculation (min): 24 min Charges:  OT General Charges $OT Visit: 1 Visit OT Evaluation $OT Eval Low Complexity: 1 Low G-Codes: OT G-codes **NOT FOR INPATIENT CLASS** Functional Assessment Tool Used: Clinical judgement Functional Limitation: Self care Self Care Current Status (N8295(G8987): At least 60 percent but less than 80 percent impaired, limited or restricted Self Care Goal Status (A2130(G8988): At least 60 percent but less than 80 percent impaired, limited or restricted Self Care Discharge Status [id number]([id number]): At least 60 percent but less than 80 percent impaired, limited or restricted   Marica OtterMaryellen Dann Galicia, OTR/L [phone] 09/29/2017  Antonio [name] 09/29/2017, 12:45 PM

## 2017-09-29 NOTE — Progress Notes (Signed)
Discharge planning, no HH needs identified. Does need RW, has raised toilets. Contacted [facility] to deliver RW to room. [phone]

## 2017-09-29 NOTE — Progress Notes (Signed)
Patient ID: Antonio AlphaRobert E [name], male   DOB: [birth year], 56 y.o.   MRN: [id number] Subjective: 1 Day Post-Op Procedure(s) (LRB): RIGHT TOTAL KNEE ARTHROPLASTY (Right)    Patient reports pain as mild.  Up on conference call this am.  Doing well, pleased.  Walked halls last night  Objective:   VITALS:   Vitals:   09/29/17 0248 09/29/17 0657  BP: (!) 126/56 (!) 114/52  Pulse: 61 68  Resp: 16 14  Temp: 98.2 F (36.8 C) 98.2 F (36.8 C)  SpO2: 96% 100%    Neurovascular intact Incision: dressing C/D/I  LABS Recent Labs    09/29/17 0533  HGB 12.7*  HCT 39.1  WBC 14.4*  PLT 224    Recent Labs    09/29/17 0533  NA 138  K 3.9  BUN 17  CREATININE 0.92  GLUCOSE 146*    No results for input(s): LABPT, INR in the last 72 hours.   Assessment/Plan: 1 Day Post-Op Procedure(s) (LRB): RIGHT TOTAL KNEE ARTHROPLASTY (Right)   Advance diet Up with therapy  Home today after am therapy RTC in 2 weeks outpt PT already set up

## 2017-09-29 NOTE — Progress Notes (Signed)
Physical Therapy Treatment Patient Details Name: Nuala AlphaRobert E Villaflor MRN: [id number] DOB: 1961-10-15 Today's Date: 09/29/2017    History of Present Illness s/p R TKA; PMHx: Parkinson's    PT Comments    Instructed in tairs, ready fordc today.    Follow Up Recommendations  DC plan and follow up therapy as arranged by surgeon     Equipment Recommendations  Rolling walker with 5" wheels    Recommendations for Other Services       Precautions / Restrictions Precautions Precautions: Fall;Knee Restrictions Other Position/Activity Restrictions: WBAT    Mobility  Bed Mobility   Bed Mobility: Sit to Supine     Supine to sit: Min assist Sit to supine: Supervision   General bed mobility comments: cues for technique  Transfers   Equipment used: Rolling walker (2 wheeled) Transfers: Sit to/from Stand Sit to Stand: Min guard;Min assist         General transfer comment: extra time to stand, cues for hand position  Ambulation/Gait Ambulation/Gait assistance: Min guard Ambulation Distance (Feet): 100 Feet Assistive device: Rolling walker (2 wheeled) Gait Pattern/deviations: Step-to pattern;Antalgic;Trunk flexed     General Gait Details: cues for RW position and trunnk extension   Stairs Stairs: Yes   Stair Management: One rail Left;Forwards;With cane Number of Stairs: 3 General stair comments: practiced x 2 up and down shallow and  higher steps  Wheelchair Mobility    Modified Rankin (Stroke Patients Only)       Balance                                            Cognition Arousal/Alertness: Awake/alert Behavior During Therapy: WFL for tasks assessed/performed Overall Cognitive Status: Within Functional Limits for tasks assessed                                        Exercises Total Joint Exercises Ankle Circles/Pumps: AROM;Both;10 reps Quad Sets: AROM;10 reps;Both Short Arc Quad: AROM;Right;10 reps Heel Slides:  AROM;Right;10 reps Hip ABduction/ADduction: AROM;Right;10 reps Straight Leg Raises: AROM;Right;10 reps Goniometric ROM: 15-50 right knee    General Comments        Pertinent Vitals/Pain Pain Score: 1  Pain Location: right knee Pain Descriptors / Indicators: Sore Pain Intervention(s): Premedicated before session;Ice applied    Home Living Family/patient expects to be discharged to:: Private residence Living Arrangements: Spouse/significant other Available Help at Discharge: Family         Home Equipment: Grab bars - toilet;Grab bars - tub/shower;Shower seat - built in Additional Comments: pt just had bathroom updated    Prior Function Level of Independence: Independent          PT Goals (current goals can now be found in the care plan section) Acute Rehab PT Goals Patient Stated Goal: be able to move easier without knee locking  Progress towards PT goals: Progressing toward goals    Frequency    7X/week      PT Plan Current plan remains appropriate    Co-evaluation              AM-PAC PT "6 Clicks" Daily Activity  Outcome Measure  Difficulty turning over in bed (including adjusting bedclothes, sheets and blankets)?: None Difficulty moving from lying on back to sitting on the side of the  bed? : None Difficulty sitting down on and standing up from a chair with arms (e.g., wheelchair, bedside commode, etc,.)?: A Little Help needed moving to and from a bed to chair (including a wheelchair)?: A Little Help needed walking in hospital room?: A Little Help needed climbing 3-5 steps with a railing? : A Little 6 Click Score: 20    End of Session   Activity Tolerance: Patient tolerated treatment well Patient left: in bed;with call bell/phone within reach Nurse Communication: Mobility status PT Visit Diagnosis: Difficulty in walking, not elsewhere classified (R26.2)     Time: 6213-08651123-1159 PT Time Calculation (min) (ACUTE ONLY): 36 min  Charges:  $Gait  Training: 8-22 mins $Therapeutic Exercise: 8-22 mins                    G CodesBlanchard Kelch:       Lorin Hauck PT [phone]    Rada HayHill, Maryana Pittmon Elizabeth 09/29/2017, 1:43 PM

## 2017-10-05 NOTE — Discharge Summary (Signed)
Physician Discharge Summary  Patient ID: Antonio [name] MRN: [id number] DOB/AGE: Apr 13, 1961 56 y.o.  Admit date: 09/28/2017 Discharge date: 09/29/2017   Procedures:  Procedure(s) (LRB): RIGHT TOTAL KNEE ARTHROPLASTY (Right)  Attending Physician:  Dr. Durene RomansMatthew Olin   Admission Diagnoses:   Right knee primary OA / pain  Discharge Diagnoses:  Principal Problem:   S/P right TKA  Past Medical History:  Diagnosis Date  . Dyskinesia    occasionally   . Hx of colonic polyps 12/10/2010  . Leg pain left  . Parkinson's disease (HCC)     HPI:    Antonio [name], 56 y.o. male, has a history of pain and functional disability in the right knee due to arthritis and has failed non-surgical conservative treatments for greater than 12 weeks to include NSAID's and/or analgesics, corticosteriod injections, use of assistive devices and activity modification.  Onset of symptoms was gradual, starting >10 years ago with gradually worsening course since that time. The patient noted prior procedures on the knee to include  arthroscopy on the right knee(s).  Patient currently rates pain in the right knee(s) at 10 out of 10 with activity. Patient has night pain, worsening of pain with activity and weight bearing, pain that interferes with activities of daily living, pain with passive range of motion, crepitus and joint swelling.  Patient has evidence of periarticular osteophytes and joint space narrowing by imaging studies.  There is no active infection.   Risks, benefits and expectations were discussed with the patient.  Risks including but not limited to the risk of anesthesia, blood clots, nerve damage, blood vessel damage, failure of the prosthesis, infection and up to and including death.  Patient understand the risks, benefits and expectations and wishes to proceed with surgery.  PCP: Delfin GantKendall, Adam S, MD   Discharged Condition: good  Hospital Course:  Patient underwent the above stated procedure on  09/28/2017. Patient tolerated the procedure well and brought to the recovery room in good condition and subsequently to the floor.  POD #1 BP: 114/52 ; Pulse: 68 ; Temp: 98.2 F (36.8 C) ; Resp: 14 Patient reports pain as mild.  Up on conference call this am.  Doing well, pleased.  Walked halls last night. Neurovascular intact and incision: dressing C/D/I.  LABS  Basename    HGB     12.7  HCT     39.1    Discharge Exam: General appearance: alert, cooperative and no distress Extremities: Homans sign is negative, no sign of DVT, no edema, redness or tenderness in the calves or thighs and no ulcers, gangrene or trophic changes  Disposition: Home with follow up in 2 weeks   Follow-up Information    Durene Romanslin, Thora Scherman, MD. Schedule an appointment as soon as possible for a visit in 2 week(s).   Specialty:  Orthopedic Surgery Contact information: [address] [place] [address] [phone]           Discharge Instructions    Call MD / Call 911   Complete by:  As directed    If you experience chest pain or shortness of breath, CALL 911 and be transported to the hospital emergency room.  If you develope a fever above 101 F, pus (white drainage) or increased drainage or redness at the wound, or calf pain, call your surgeon's office.   Change dressing   Complete by:  As directed    Maintain surgical dressing until follow up in the clinic. If the edges start  to pull up, may reinforce with tape. If the dressing is no longer working, may remove and cover with gauze and tape, but must keep the area dry and clean.  Call with any questions or concerns.   Constipation Prevention   Complete by:  As directed    Drink plenty of fluids.  Prune juice may be helpful.  You may use a stool softener, such as Colace (over the counter) 100 mg twice a day.  Use MiraLax (over the counter) for constipation as needed.   Diet - low sodium heart healthy   Complete by:  As directed     Discharge instructions   Complete by:  As directed    Maintain surgical dressing until follow up in the clinic. If the edges start to pull up, may reinforce with tape. If the dressing is no longer working, may remove and cover with gauze and tape, but must keep the area dry and clean.  [facility]. Call with any questions or concerns.   Increase activity slowly as tolerated   Complete by:  As directed    Weight bearing as tolerated with assist device (walker, cane, etc) as directed, use it as long as suggested by your surgeon or therapist, typically at least 4-6 weeks.   TED hose   Complete by:  As directed    Use stockings (TED hose) for 2 weeks on both leg(s).  You may remove them at night for sleeping.      Allergies as of 09/29/2017   No Known Allergies     Medication List    STOP taking these medications   ibuprofen 200 MG tablet Commonly known as:  ADVIL,MOTRIN   meloxicam 7.5 MG tablet Commonly known as:  MOBIC     TAKE these medications   aspirin 81 MG chewable tablet Commonly known as:  ASPIRIN CHILDRENS Chew 1 tablet (81 mg total) 2 (two) times daily by mouth.   baclofen 10 MG tablet Commonly known as:  LIORESAL Take 1 tablet (10 mg total) by mouth at bedtime as needed for muscle spasms.   carbidopa-levodopa 50-200 MG tablet Commonly known as:  SINEMET CR Take 1 tablet by mouth at bedtime. What changed:    when to take this  reasons to take this   carbidopa-levodopa 25-100 MG tablet Commonly known as:  SINEMET IR Take 1 tablet by mouth 5 (five) times daily. Take at 7 AM, 10 AM, 1 PM, 4 PM and 7 PM What changed:    when to take this  additional instructions   docusate sodium 100 MG capsule Commonly known as:  COLACE Take 1 capsule (100 mg total) 2 (two) times daily by mouth.   escitalopram 5 MG tablet Commonly known as:  LEXAPRO Take 1 tablet (5 mg total) by mouth daily. Take at lunch time. Okay to be on Azilect,  no contraindication (Note to pharmacist)   ferrous sulfate 325 (65 FE) MG tablet Commonly known as:  FERROUSUL Take 1 tablet (325 mg total) 3 (three) times daily with meals by mouth.   HYDROcodone-acetaminophen 7.5-325 MG tablet Commonly known as:  NORCO Take 1-2 tablets every 4 (four) hours as needed by mouth for moderate pain or severe pain.   polyethylene glycol packet Commonly known as:  MIRALAX / GLYCOLAX Take 17 g 2 (two) times daily by mouth.   pramipexole 1 MG tablet Commonly known as:  MIRAPEX Take 1 tablet (1 mg total) by mouth 3 (three) times  daily. Taking at 8am, 12pm, and 5 pm   rasagiline 1 MG Tabs tablet Commonly known as:  AZILECT Take 1 tablet (1 mg total) by mouth daily.   VALTREX 1000 MG tablet Generic drug:  valACYclovir Take 1,000 mg by mouth as needed (cold sores).            Discharge Care Instructions  (From admission, onward)        Start     Ordered   09/29/17 0000  Change dressing    Comments:  Maintain surgical dressing until follow up in the clinic. If the edges start to pull up, may reinforce with tape. If the dressing is no longer working, may remove and cover with gauze and tape, but must keep the area dry and clean.  Call with any questions or concerns.   09/29/17 1610       Signed: Anastasio Auerbach. Holbert Caples   PA-C  10/05/2017, 8:53 PM

## 2018-01-20 ENCOUNTER — Encounter: Payer: Self-pay | Admitting: Neurology

## 2018-01-20 ENCOUNTER — Ambulatory Visit: Payer: BLUE CROSS/BLUE SHIELD | Admitting: Neurology

## 2018-01-20 VITALS — BP 118/64 | HR 72 | Ht 73.5 in | Wt 259.0 lb

## 2018-01-20 DIAGNOSIS — G20 Parkinson's disease: Secondary | ICD-10-CM | POA: Diagnosis not present

## 2018-01-20 DIAGNOSIS — M25562 Pain in left knee: Secondary | ICD-10-CM

## 2018-01-20 DIAGNOSIS — G8929 Other chronic pain: Secondary | ICD-10-CM | POA: Diagnosis not present

## 2018-01-20 DIAGNOSIS — M79651 Pain in right thigh: Secondary | ICD-10-CM | POA: Diagnosis not present

## 2018-01-20 MED ORDER — CARBIDOPA-LEVODOPA 25-100 MG PO TABS: 1.0000 | ORAL_TABLET | Freq: Every day | ORAL | 3 refills | Status: DC

## 2018-01-20 MED ORDER — PRAMIPEXOLE DIHYDROCHLORIDE 1 MG PO TABS: 1.0000 mg | ORAL_TABLET | Freq: Three times a day (TID) | ORAL | 3 refills | Status: DC

## 2018-01-20 MED ORDER — CARBIDOPA-LEVODOPA ER 50-200 MG PO TBCR: 1.0000 | EXTENDED_RELEASE_TABLET | Freq: Every day | ORAL | 3 refills | Status: DC

## 2018-01-20 MED ORDER — RASAGILINE MESYLATE 1 MG PO TABS: 1.0000 mg | ORAL_TABLET | Freq: Every day | ORAL | 3 refills | Status: DC

## 2018-01-20 MED ORDER — BACLOFEN 10 MG PO TABS: 10.0000 mg | ORAL_TABLET | Freq: Every evening | ORAL | 3 refills | Status: DC | PRN

## 2018-01-20 NOTE — Patient Instructions (Addendum)
You are doing okay, thankfully.  [facility].  You should make an appointment with Dr. Charlann Boxerlin about your left knee.  You are at fall risk. Do not climb ladders or carry heavy loads.  We will keep your meds the same, and leave you off the lexapro. Stay well hydrated and well rested.  We will do a 6 month check up.

## 2018-01-20 NOTE — Progress Notes (Signed)
Subjective:    Patient ID: Antonio [name] is a 57 y.o. male.  HPI     Interim history:   Antonio [name] is a very pleasant 57 year-old right-handed gentleman with an underlying benign medical history, who presents for follow-up consultation of his right-sided predominant Parkinson's disease and his restless leg syndrome. The patient is unaccompanied today. I last saw him on 09/22/2017, at which time he reported a fall. His right knee was bothering him. He was scheduled for right total knee replacement on 09/28/2017. He was also scheduled for a DBS consultation at [facility].  Today, 01/20/2018 (all dictated new, as well as above notes, some dictation done in note pad or Word, outside of chart, may appear as copied):    He reports doing okay, had DBS consultation with Dr. Linus Mako at [facility] [place] on 01/04/2018. He reports that he stopped taking the Lexapro. He had right total knee replacement surgery on 09/28/2018 under Dr. Alvan Dame. His left knee is bothering him. He likely needs knee replacement surgery in the left, in fact, he reports that his left knee has had worse arthritis than the right but his right knee was more painful, possibly secondary to having parkinsonian symptoms affecting his right side more than left. His restless legs is under control. The baclofen has helped. He needs refills on his medications. He has not had any repercussions after stopping Lexapro. He was on it for about 3 months or so. He is overall feeling better, less pain and has had a better outlook. He has an appointment with a psychologist at [facility] scheduled for 01/27/2018. He does not have an appointment with the neurosurgeon yet. He fell yesterday but admits that he was on uneven ground and was working around [place]. He skin the area below the right knee but thankfully the right knee is doing fine. He is not taking any pain medication and after that knee replacement surgery he did not take any narcotic pain medication. He  is back on Azilect once daily, Mirapex 3 times a day, Sinemet CR at bedtime and Sinemet immediate release 5 times a day. He takes baclofen as needed once a day. He's had lower back pain off and on with radiation to the right thigh, we did a lumbar spine MRI in March 2017 with degenerative changes noted but no serious spinal stenosis or herniated disc.   The patient's allergies, current medications, family history, past medical history, past social history, past surgical history and problem list were reviewed and updated as appropriate.    Previously (copied from previous notes for reference):      I saw him on 06/22/2017, at which time he reported more off time. He felt he was more progressed in his symptoms. His wife reported that he had fallen several times. He had more recent stressors. His wife had noted more freezing. He was more irritable and frustrated easier. He had more issues with his right knee, most likely needing total knee replacement. I suggested we increase his Sinemet to 1 pill 5 times a day and keep his Mirapex 1 mg 3 times a day, Sinemet CR bedtime, and keep his Azilect the same. I suggested an opinion for DBS evaluation at [facility]. I also suggested he start taking Lexapro 5 mg strength.     I saw him on 02/16/2017, at which time he was on Sinemet 4 times a day and Sinemet CR at bedtime. He had to have interim right knee surgery after an injury.  He was still on hydrocodone as needed for pain. 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 Lower extremity swelling was about the same, memory stable, no recent falls. No side effects from his medication. He was off of Azilect for his surgery and was able to restarted without problems after about 2 weeks. I asked him to continue with Mirapex 3 times a day, Sinemet CR bedtime, Sinemet IR 4 times a day and Azilect once daily.   I saw him on 05/12/2016, at which time he was reporting right leg pain and thigh pain. This  seemed to be worse in between Sinemet doses. He was on Sinemet 3 times a day but was not always keeping a set schedule for it. He was also taking the CR Sinemet at night more consistently. I suggested low-dose baclofen as needed 10 mg strength for his right leg muscle tension and pain. I suggested we continue with Azilect and Mirapex at the current doses but increase his Sinemet to 1 pill 4 times a day at 7, 11, 3 PM and 7 PM. He was also encouraged to take the CR at night.     I saw him on 01/13/16, at which time he reported feeling fairly stable. He did not continue with the CR at night as he did not notice much in the way of difference. He denied any recent restless leg symptoms. He did feel Mirapex was helpful for that. He was taking Sinemet 3 times a day and Mirapex 3 times a day, Azilect 1 mg once daily, which was generic. He reported fall about a week prior to his last appointment. He scraped his knee.he does not always drink enough water. He had some right-sided sciatica symptoms and radiating pain to the back of his thigh.  I ordered an MRI L spine wo contrast, which he had on 01/22/16:  IMPRESSION:  This MRI of the lumbar spine without contrast shows the following: 1.    At L3-L4, there is a midline disc protrusion and mild facet hypertrophy combining to cause borderline spinal stenosis and moderate left lateral recess stenosis there does not appear to be nerve root compression though there is some encroachment upon the traversing left L4 nerve root. 2.   There are milder degenerative changes at L2-L3, L4-L5 and L5-S1 with less potential for nerve root impingement. 3.   There are no acute findings.   We called him with the test results.    I saw him on 09/09/2015, at which time he reported that the addition of Azilect was helpful. I suggested he continue with Sinemet tid, Mirapex 1 mg tid, Azilect once daily and try C/L CR at night.   I saw him on 04/24/2015, at which time the patient reported  worsening balance and increase in his tremors. Unfortunately, he had fallen recently and fell backwards as he stood up from a chair and fell onto his back after which she felt sore for a couple of days but thankfully did not injure himself seriously. He did not have any head injury or loss of consciousness. He was working 2 jobs. He was not drinking enough water. He was not sleeping as well at night. Memory was stable. He denied significant depressive symptoms. His restless legs symptoms were under control. I suggested she continue with Sinemet and Mirapex, but I asked him to restart Azilect which he had tried in the past. We talked about potentially pursuing DBS surgery in the future.   I  first met him on 12/25/2014, at which time he reported improved parkinsonian symptoms with Mirapex and Sinemet. I asked him to continue his medications, Mirapex 1 mg strength one pill 3 times a day and Sinemet 25-100 milligrams strength one pill 3 times a day. I suggested he could add a half pill of Sinemet as needed in the evenings.    He previously followed with Dr. Jim Like was last seen by him on 07/17/2014, at which time the patient reported having stopped Sinemet and he was using it as needed only. His Mirapex was increased to 1 mg 3 times a day as he also reported residual restless leg symptoms. The addition of Azilect was discussed.  He talked about DBS for future consideration.   Prior to that he was followed by Dr. Morene Antu and was last seen by Dr. Erling Cruz on 11/25/2011. He was originally seen by Dr. Erling Cruz in November 2009 at which time he presented with right-sided stiffness and fine motor dyscontrol. He has a positive family history of Parkinson's disease in his father. He was initially started on Azilect and Mirapex was added in 2011. He started Sinemet 25-100 milligrams strength one pill 3 times a day and eventually stop the Azilect, due to side effects reported, including cognitive SEs.   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 He is a non-smoker, he drinks wine usually on weekends.    He noted a R hand tremor in the last 6 months. He has work related stress. He sleeps well generally speaking. Confined spaces are more difficulty to negotiate. He has mild memory issues. He has not fallen. He did not do well without the Sinemet and has since then restarted it. He takes it 3 times a day along with one pill of Mirapex. He has not taken his midday dose yet. He has no significant side effects except for mild sleepiness. He has never fallen asleep while driving. He has mild swelling in his feet at times especially on longer plane rides. He has to travel internationally maybe 3 times a year. His medication dose does last for 4 and sometimes 5 hours but at the end of the day especially if he's had a long work day he feels fatigued and more off. He goes to bed late. It is usually between midnight and 1 AM. He has a rise time of 7:30 AM. He snores when he sleeps on his back but does not have any gasping sensations are witnessed apneas as he recalls. His RLS symptoms are under control.      His Past Medical History Is Significant For: Past Medical History:  Diagnosis Date  . Dyskinesia    occasionally   . Hx of colonic polyps 12/10/2010  . Leg pain left  . Parkinson's disease ([place])     His Past Surgical History Is Significant For: Past Surgical History:  Procedure Laterality Date  . COLONOSCOPY    . KNEE ARTHROSCOPY Right 01/2017   with Dr Alvan Dame ;at surgical center   . None listed    . TOTAL KNEE ARTHROPLASTY Right 09/28/2017   Procedure: RIGHT TOTAL KNEE ARTHROPLASTY;  Surgeon: Paralee Cancel, MD;  Location: [facility];  Service: Orthopedics;  Laterality: Right;  70 mins    His Family History Is Significant For: Family History  Problem Relation Age of Onset  . Heart disease Mother   . Diabetes Father   . Parkinson's disease Father   .  Colon cancer Neg Hx   .  Esophageal cancer Neg Hx   . Rectal cancer Neg Hx   . Stomach cancer Neg Hx     His Social History Is Significant For: Social History   Socioeconomic History  . Marital status: Married    Spouse name: Lanelle Bal  . Number of children: 3  . Years of education: Ph.D  . Highest education level: None  Social Needs  . Financial resource strain: None  . Food insecurity - worry: None  . Food insecurity - inability: None  . Transportation needs - medical: None  . Transportation needs - non-medical: None  Occupational History    Employer: [employer]  Tobacco Use  . Smoking status: Never Smoker  . Smokeless tobacco: Never Used  Substance and Sexual Activity  . Alcohol use: Yes    Alcohol/week: 3.0 oz    Types: 5 Glasses of wine per week    Comment: 2 bottles of wine on week-end.  . Drug use: No  . Sexual activity: None  Other Topics Concern  . None  Social History Narrative   Patient is right handed   Patient lives at home with his wife Lanelle Bal. 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   Caffeine- coffee on occasion    Patient has a Ph.D.          His Allergies Are:  No Known Allergies:   His Current Medications Are:  Outpatient Encounter Medications as of 01/20/2018  Medication Sig  . baclofen (LIORESAL) 10 MG tablet Take 1 tablet (10 mg total) by mouth at bedtime as needed for muscle spasms.  . carbidopa-levodopa (SINEMET CR) 50-200 MG tablet Take 1 tablet by mouth at bedtime. (Patient taking differently: Take 1 tablet by mouth at bedtime as needed (mobility issues). )  . carbidopa-levodopa (SINEMET IR) 25-100 MG tablet Take 1 tablet by mouth 5 (five) times daily. Take at 7 AM, 10 AM, 1 PM, 4 PM and 7 PM (Patient taking differently: Take 1 tablet by mouth 4 (four) times daily. )  . HYDROcodone-acetaminophen (NORCO) 7.5-325 MG tablet Take 1-2 tablets every 4 (four) hours as needed by mouth for moderate pain or severe  pain.  . pramipexole (MIRAPEX) 1 MG tablet Take 1 tablet (1 mg total) by mouth 3 (three) times daily. Taking at 8am, 12pm, and 5 pm  . rasagiline (AZILECT) 1 MG TABS tablet Take 1 tablet (1 mg total) by mouth daily.  . valACYclovir (VALTREX) 1000 MG tablet Take 1,000 mg by mouth as needed (cold sores).   . [DISCONTINUED] docusate sodium (COLACE) 100 MG capsule Take 1 capsule (100 mg total) 2 (two) times daily by mouth.  . [DISCONTINUED] escitalopram (LEXAPRO) 5 MG tablet Take 1 tablet (5 mg total) by mouth daily. Take at lunch time. Okay to be on Azilect, no contraindication (Note to pharmacist)  . [DISCONTINUED] ferrous sulfate (FERROUSUL) 325 (65 FE) MG tablet Take 1 tablet (325 mg total) 3 (three) times daily with meals by mouth.  . [DISCONTINUED] polyethylene glycol (MIRALAX / GLYCOLAX) packet Take 17 g 2 (two) times daily by mouth.   No facility-administered encounter medications on file as of 01/20/2018.   :  Review of Systems:  Out of a complete 14 point review of systems, all are reviewed and negative with the exception of these symptoms as listed below: Review of Systems  Neurological:       Pt presents today to  discuss his PD. Pt reports having "dose failures" with his sinemet. Pt d/c the escitalopram because he "changed his attitude" which helped. Pt saw Dr. Linus Mako for a DBS consult and was told that he is a great candidate.    Objective:  Neurological Exam  Physical Exam Physical Examination:   Vitals:   01/20/18 1023  BP: 118/64  Pulse: 72    General Examination: The patient is a very pleasant 57 y.o. male in no acute distress. He appears well-developed and well-nourished and well groomed.   HEENT:Normocephalic, atraumatic, pupils are equal, round and reactive to light and accommodation. Wears corrective eyeglasses. Extraocular tracking shows mild to moderate saccadic breakdown, no nystagmus, he has mild limitation to vertical gaze. He has a decreased eye blink rate  and mild to moderate facial masking, moderatehypophonia, no dysarthria, oropharynx examination reveals moderate mouth dryness, tongue protrudes centrally and palate elevates symmetrically. There is no drooling, no nasal drainage. No facial dyskinesias are noted.   Chest:Clear to auscultation without wheezing, rhonchi or crackles noted.  Heart:S1+S2+0, regular and normal without murmurs, rubs or gallops noted.   Abdomen:Soft, non-tender and non-distended with normal bowel sounds appreciated on auscultation.  Extremities:There is some edema in the distal lower extremities bilaterally.  Skin: Warm and dry without trophic changes noted.  Musculoskeletal: exam revealsright knee is wider than left, has left knee discomfort.   Neurologically:  Mental status: The patient is awake, alert and oriented in all 4 spheres. Hisimmediate and remote memory, attention, language skills and fund of knowledge are appropriate. There is no evidence of aphasia, agnosia, apraxia or anomia. Speech is clear with normal prosody and enunciation. Thought process is linear. Mood is normaland affect is normal.  Cranial nerves II - XII are as described above under HEENT exam.  Motor exam: Normal bulk, and strength are noted, he has fairly good range of motion in the right knee, he has no back pain. He has increase in tone and right more than left upper and lower extremities, no dyskinesias, only intermittent resting tremor noted in the right upper extremity. Fine motor skills are moderately impaired on the right and better on the left. He stands up with mild difficulty, posture is mildly stooped, he walks with a limp on the right and does not a cup his right foot as well. He did not bring a cane. Balance is mildly impaired, sensory exam stable to light touch, no cerebellar signs, no dysmetria. No gait ataxia. Reflexes are 2+.  Assessment and plan:   In summary, Antonio [name] a very pleasant 57 year old  malewith an underlying benign medical history who presents for follow-up consultation of his right-sided predominant Parkinson's disease, diagnosis dating back to 2009 and symptoms dating back to about 2008. He remained stable for several years, was doing well on a combination of Sinemet one pill 3 times a day as well as Mirapex 3 times a day. He was off of Azilect for a while and restarted it successfully. We started the CR at night. Unfortunately, he sustained an injury to his right knee and needed surgery for this in March 2018. He has had significant arthritis in both knees, no successfully status post right knee replacement surgery, will likely need left knee replacement surgery. It would make sense to pursue the left knee replacement surgery before DBS surgery. He has had a consultation with Dr. Linus Mako at [facility] and has an appointment pending to see the psychologist next month. We mutually agreed to keep his medications  the same, he can stay off the Lexapro at this time. He is strongly reminded about his fall risk and the risk for injuries. He is advised not to climb any ladders or do any heavy lifting or use heavy equipment. He is encouraged to make a follow-up appointment with his orthopedic surgeon. I will see him back routinely in 6 months, sooner if needed. I answered all his questions today and he was in agreement. I spent 30 minutes in total face-to-face time with the patient, more than 50% of which was spent in counseling and coordination of care, reviewing test results, reviewing medication and discussing or reviewing the diagnosis of PD, its prognosis and treatment options. Pertinent laboratory and imaging test results that were available during this visit with the patient were reviewed by me and considered in my medical decision making (see chart for details).

## 2018-03-09 NOTE — Progress Notes (Signed)
Need orders in epic for 5-21- surgery 

## 2018-03-30 NOTE — Patient Instructions (Addendum)
Antonio [name] Antonio [name]  03/30/2018   Your procedure is scheduled on: 04-12-18   Report to [facility] Main  Entrance    Report to admitting at 7:35 AM    Call this number if you have problems the morning of surgery [phone]   Remember: Do not eat food or drink liquids :After Midnight.     Take these medicines the morning of surgery with A SIP OF WATER: Carbidopa-Levodopa (Sinemet CR), Pramipexole (Mirapex), and Rasagiline (Azilect)                                You may not have any metal on your body including hair pins and              piercings  Do not wear jewelry, lotions, powders or deodorant             Men may shave face and neck.   Do not bring valuables to the hospital.  IS NOT             RESPONSIBLE   FOR VALUABLES.  Contacts, dentures or bridgework may not be worn into surgery.  Leave suitcase in the car. After surgery it may be brought to your room.    Special Instructions: N/A              Please read over the following fact sheets you were given: _____________________________________________________________________          [place] - Preparing for Surgery Before surgery, you can play an important role.  Because skin is not sterile, your skin needs to be as free of germs as possible.  You can reduce the number of germs on your skin by washing with CHG (chlorahexidine gluconate) soap before surgery.  CHG is an antiseptic cleaner which kills germs and bonds with the skin to continue killing germs even after washing. Please DO NOT use if you have an allergy to CHG or antibacterial soaps.  If your skin becomes reddened/irritated stop using the CHG and inform your nurse when you arrive at Short Stay. Do not shave (including legs and underarms) for at least 48 hours prior to the first CHG shower.  You may shave your face/neck. Please follow these instructions carefully:  1.  Shower with CHG Soap the night before surgery and the  morning of  Surgery.  2.  If you choose to wash your hair, wash your hair first as usual with your  normal  shampoo.  3.  After you shampoo, rinse your hair and body thoroughly to remove the  shampoo.                           4.  Use CHG as you would any other liquid soap.  You can apply chg directly  to the skin and wash                       Gently with a scrungie or clean washcloth.  5.  Apply the CHG Soap to your body ONLY FROM THE NECK DOWN.   Do not use on face/ open                           Wound or open sores.  Avoid contact with eyes, ears mouth and genitals (private parts).                       Wash face,  Genitals (private parts) with your normal soap.             6.  Wash thoroughly, paying special attention to the area where your surgery  will be performed.  7.  Thoroughly rinse your body with warm water from the neck down.  8.  DO NOT shower/wash with your normal soap after using and rinsing off  the CHG Soap.                9.  Pat yourself dry with a clean towel.            10.  Wear clean pajamas.            11.  Place clean sheets on your bed the night of your first shower and do not  sleep with pets. Day of Surgery : Do not apply any lotions/deodorants the morning of surgery.  Please wear clean clothes to the hospital/surgery center.  FAILURE TO FOLLOW THESE INSTRUCTIONS MAY RESULT IN THE CANCELLATION OF YOUR SURGERY PATIENT SIGNATURE_________________________________  NURSE SIGNATURE__________________________________  ________________________________________________________________________   Antonio [name]  An incentive spirometer is a tool that can help keep your lungs clear and active. This tool measures how well you are filling your lungs with each breath. Taking long deep breaths may help reverse or decrease the chance of developing breathing (pulmonary) problems (especially infection) following:  A long period of time when you are unable to move or be active. BEFORE  THE PROCEDURE   If the spirometer includes an indicator to show your best effort, your nurse or respiratory therapist will set it to a desired goal.  If possible, sit up straight or lean slightly forward. Try not to slouch.  Hold the incentive spirometer in an upright position. INSTRUCTIONS FOR USE  1. Sit on the edge of your bed if possible, or sit up as far as you can in bed or on a chair. 2. Hold the incentive spirometer in an upright position. 3. Breathe out normally. 4. Place the mouthpiece in your mouth and seal your lips tightly around it. 5. Breathe in slowly and as deeply as possible, raising the piston or the ball toward the top of the column. 6. Hold your breath for 3-5 seconds or for as long as possible. Allow the piston or ball to fall to the bottom of the column. 7. Remove the mouthpiece from your mouth and breathe out normally. 8. Rest for a few seconds and repeat Steps 1 through 7 at least 10 times every 1-2 hours when you are awake. Take your time and take a few normal breaths between deep breaths. 9. The spirometer may include an indicator to show your best effort. Use the indicator as a goal to work toward during each repetition. 10. After each set of 10 deep breaths, practice coughing to be sure your lungs are clear. If you have an incision (the cut made at the time of surgery), support your incision when coughing by placing a pillow or rolled up towels firmly against it. Once you are able to get out of bed, walk around indoors and cough well. You may stop using the incentive spirometer when instructed by your caregiver.  RISKS AND COMPLICATIONS  Take your time so you do not get dizzy or light-headed.  If you are in pain, you may need to take or ask for pain medication before doing incentive spirometry. It is harder to take a deep breath if you are having pain. AFTER USE  Rest and breathe slowly and easily.  It can be helpful to keep track of a log of your progress.  Your caregiver can provide you with a simple table to help with this. If you are using the spirometer at home, follow these instructions: Antonio [name] IF:   You are having difficultly using the spirometer.  You have trouble using the spirometer as often as instructed.  Your pain medication is not giving enough relief while using the spirometer.  You develop fever of 100.5 F (38.1 C) or higher. SEEK IMMEDIATE MEDICAL CARE IF:   You cough up bloody sputum that had not been present before.  You develop fever of 102 F (38.9 C) or greater.  You develop worsening pain at or near the incision site. MAKE SURE YOU:   Understand these instructions.  Will watch your condition.  Will get help right away if you are not doing well or get worse. Document Released: 03/22/2007 Document Revised: 02/01/2012 Document Reviewed: 05/23/2007 Exit[employer] Patient Information 2014 [employer], [place].   ________________________________________________________________________  WHAT IS A BLOOD TRANSFUSION? Blood Transfusion Information  A transfusion is the replacement of blood or some of its parts. Blood is made up of multiple cells which provide different functions.  Red blood cells carry oxygen and are used for blood loss replacement.  White blood cells fight against infection.  Platelets control bleeding.  Plasma helps clot blood.  Other blood products are available for specialized needs, such as hemophilia or other clotting disorders. BEFORE THE TRANSFUSION  Who gives blood for transfusions?   Healthy volunteers who are fully evaluated to make sure their blood is safe. This is blood bank blood. Transfusion therapy is the safest it has ever been in the practice of medicine. Before blood is taken from a donor, a complete history is taken to make sure that person has no history of diseases nor engages in risky social behavior (examples are intravenous drug use or sexual activity with multiple  partners). The donor's travel history is screened to minimize risk of transmitting infections, such as malaria. The donated blood is tested for signs of infectious diseases, such as HIV and hepatitis. The blood is then tested to be sure it is compatible with you in order to minimize the chance of a transfusion reaction. If you or a relative donates blood, this is often done in anticipation of surgery and is not appropriate for emergency situations. It takes many days to process the donated blood. RISKS AND COMPLICATIONS Although transfusion therapy is very safe and saves many lives, the main dangers of transfusion include:   Getting an infectious disease.  Developing a transfusion reaction. This is an allergic reaction to something in the blood you were given. Every precaution is taken to prevent this. The decision to have a blood transfusion has been considered carefully by your caregiver before blood is given. Blood is not given unless the benefits outweigh the risks. AFTER THE TRANSFUSION  Right after receiving a blood transfusion, you will usually feel much better and more energetic. This is especially true if your red blood cells have gotten low (anemic). The transfusion raises the level of the red blood cells which carry oxygen, and this usually causes an energy increase.  The nurse administering the transfusion will monitor you carefully for  complications. HOME CARE INSTRUCTIONS  No special instructions are needed after a transfusion. You may find your energy is better. Speak with your caregiver about any limitations on activity for underlying diseases you may have. SEEK MEDICAL CARE IF:   Your condition is not improving after your transfusion.  You develop redness or irritation at the intravenous (IV) site. SEEK IMMEDIATE MEDICAL CARE IF:  Any of the following symptoms occur over the next 12 hours:  Shaking chills.  You have a temperature by mouth above 102 F (38.9 C), not  controlled by medicine.  Chest, back, or muscle pain.  People around you feel you are not acting correctly or are confused.  Shortness of breath or difficulty breathing.  Dizziness and fainting.  You get a rash or develop hives.  You have a decrease in urine output.  Your urine turns a dark color or changes to pink, red, or brown. Any of the following symptoms occur over the next 10 days:  You have a temperature by mouth above 102 F (38.9 C), not controlled by medicine.  Shortness of breath.  Weakness after normal activity.  The white part of the eye turns yellow (jaundice).  You have a decrease in the amount of urine or are urinating less often.  Your urine turns a dark color or changes to pink, red, or brown. Document Released: 11/06/2000 Document Revised: 02/01/2012 Document Reviewed: 06/25/2008 [facility] Patient Information 2014 [place], [place].  _______________________________________________________________________

## 2018-03-30 NOTE — H&P (Signed)
TOTAL KNEE ADMISSION H&P  Patient is being admitted for left total knee arthroplasty.  Subjective:  Chief Complaint:   Left knee primary OA / pain  HPI: Antonio [name], 57 y.o. male, has a history of pain and functional disability in the left knee due to arthritis and has failed non-surgical conservative treatments for greater than 12 weeks to include NSAID's and/or analgesics, corticosteriod injections, activity modification and aspirations.  Onset of symptoms was gradual, starting years ago with gradually worsening course since that time. The patient noted prior procedures on the knee to include  arthroplasty on the left knee per Dr. Charlann Boxer on September 28, 2017.  Patient currently rates pain in the left knee(s) at 9 out of 10 with activity. Patient has worsening of pain with activity and weight bearing, pain that interferes with activities of daily living, pain with passive range of motion, crepitus and joint swelling.  Patient has evidence of periarticular osteophytes and joint space narrowing by imaging studies.  There is no active infection.  Risks, benefits and expectations were discussed with the patient.  Risks including but not limited to the risk of anesthesia, blood clots, nerve damage, blood vessel damage, failure of the prosthesis, infection and up to and including death.  Patient understand the risks, benefits and expectations and wishes to proceed with surgery.   PCP: Delfin Gant, MD  D/C Plans:       Home   Post-op Meds:       No Rx given   Tranexamic Acid:      To be given - IV   Decadron:      Is to be given  FYI:      ASA  Norco  DME:   Pt already has equipment  PT:   OPPT Rx given   Patient Active Problem List   Diagnosis Date Noted  . S/P right TKA 09/28/2017  . Parkinson disease (HCC) 06/23/2013  . RLS (restless legs syndrome) 06/23/2013  . Depression 06/23/2013  . Hx of colonic polyps 12/10/2010   Past Medical History:  Diagnosis Date  . Dyskinesia    occasionally   . Hx of colonic polyps 12/10/2010  . Leg pain left  . Parkinson's disease [facility])     Past Surgical History:  Procedure Laterality Date  . COLONOSCOPY    . KNEE ARTHROSCOPY Right 01/2017   with Dr Charlann Boxer ;at surgical center   . None listed    . TOTAL KNEE ARTHROPLASTY Right 09/28/2017   Procedure: RIGHT TOTAL KNEE ARTHROPLASTY;  Surgeon: Durene Romans, MD;  Location: [facility] ORS;  Service: Orthopedics;  Laterality: Right;  70 mins    No current facility-administered medications for this encounter.    Current Outpatient Medications  Medication Sig Dispense Refill Last Dose  . baclofen (LIORESAL) 10 MG tablet Take 1 tablet (10 mg total) by mouth at bedtime as needed for muscle spasms. 90 each 3   . carbidopa-levodopa (SINEMET CR) 50-200 MG tablet Take 1 tablet by mouth at bedtime. 90 tablet 3   . carbidopa-levodopa (SINEMET IR) 25-100 MG tablet Take 1 tablet by mouth 5 (five) times daily. Take at 7 AM, 10 AM, 1 PM, 4 PM and 7 PM 450 tablet 3   . ibuprofen (ADVIL,MOTRIN) 200 MG tablet Take 400 mg by mouth daily as needed for moderate pain.     . pramipexole (MIRAPEX) 1 MG tablet Take 1 tablet (1 mg total) by mouth 3 (three) times daily. Taking at 8am, 12pm, and 5  pm 270 tablet 3   . rasagiline (AZILECT) 1 MG TABS tablet Take 1 tablet (1 mg total) by mouth daily. 90 tablet 3   . valACYclovir (VALTREX) 1000 MG tablet Take 1,000 mg by mouth as needed (cold sores).    Taking  . HYDROcodone-acetaminophen (NORCO) 7.5-325 MG tablet Take 1-2 tablets every 4 (four) hours as needed by mouth for moderate pain or severe pain. (Patient not taking: Reported on 03/25/2018) 60 tablet 0 Completed Course at Unknown time   No Known Allergies   Social History   Tobacco Use  . Smoking status: Never Smoker  . Smokeless tobacco: Never Used  Substance Use Topics  . Alcohol use: Yes    Alcohol/week: 3.0 oz    Types: 5 Glasses of wine per week    Comment: 2 bottles of wine on week-end.    Family History   Problem Relation Age of Onset  . Heart disease Mother   . Diabetes Father   . Parkinson's disease Father   . Colon cancer Neg Hx   . Esophageal cancer Neg Hx   . Rectal cancer Neg Hx   . Stomach cancer Neg Hx      Review of Systems  Constitutional: Negative.   HENT: Negative.   Eyes: Negative.   Respiratory: Negative.   Cardiovascular: Negative.   Gastrointestinal: Negative.   Genitourinary: Negative.   Musculoskeletal: Positive for joint pain.  Skin: Negative.   Neurological: Negative.   Endo/Heme/Allergies: Negative.   Psychiatric/Behavioral: Negative.     Objective:  Physical Exam  Constitutional: He is oriented to person, place, and time. He appears well-developed.  HENT:  Head: Normocephalic.  Eyes: Pupils are equal, round, and reactive to light.  Neck: Neck supple. No JVD present. No tracheal deviation present. No thyromegaly present.  Cardiovascular: Normal rate, regular rhythm and intact distal pulses.  Respiratory: Effort normal and breath sounds normal. No respiratory distress. He has no wheezes.  GI: Soft. There is no tenderness. There is no guarding.  Musculoskeletal:       Left knee: He exhibits decreased range of motion, swelling and bony tenderness. He exhibits no ecchymosis, no deformity, no laceration and no erythema. Tenderness found.  Lymphadenopathy:    He has no cervical adenopathy.  Neurological: He is alert and oriented to person, place, and time.  Skin: Skin is warm and dry.  Psychiatric: He has a normal mood and affect.      Labs:  Estimated body mass index is 33.71 kg/m as calculated from the following:   Height as of 01/20/18: 6' 1.5" (1.867 m).   Weight as of 01/20/18: 117.5 kg (259 lb).   Imaging Review Plain radiographs demonstrate severe degenerative joint disease of the left knee. The bone quality appears to be good for age and reported activity level.   Preoperative templating of the joint replacement has been completed,  documented, and submitted to the Operating Room personnel in order to optimize intra-operative equipment management.    Patient's anticipated LOS is less than 2 midnights, meeting these requirements: - Younger than 25 - Lives within 1 hour of care - Has a competent adult at home to recover with post-op recover - NO history of  - Chronic pain requiring opiods  - Diabetes  - Coronary Artery Disease  - Heart failure  - Heart attack  - Stroke  - DVT/VTE  - Cardiac arrhythmia  - Respiratory Failure/COPD  - Renal failure  - Anemia  - Advanced Liver disease  Assessment/Plan:  End stage arthritis, left knee   The patient history, physical examination, clinical judgment of the provider and imaging studies are consistent with end stage degenerative joint disease of the left knee(s) and total knee arthroplasty is deemed medically necessary. The treatment options including medical management, injection therapy arthroscopy and arthroplasty were discussed at length. The risks and benefits of total knee arthroplasty were presented and reviewed. The risks due to aseptic loosening, infection, stiffness, patella tracking problems, thromboembolic complications and other imponderables were discussed. The patient acknowledged the explanation, agreed to proceed with the plan and consent was signed. Patient is being admitted for inpatient treatment for surgery, pain control, PT, OT, prophylactic antibiotics, VTE prophylaxis, progressive ambulation and ADL's and discharge planning. The patient is planning to be discharged home.      Anastasio Auerbach Primo Innis   PA-C  03/30/2018, 11:04 AM

## 2018-03-31 ENCOUNTER — Encounter (HOSPITAL_COMMUNITY)
Admission: RE | Admit: 2018-03-31 | Discharge: 2018-03-31 | Disposition: A | Discharge status: Home / Self Care | Payer: BLUE CROSS/BLUE SHIELD | Source: Ambulatory Visit | Attending: Orthopedic Surgery | Admitting: Orthopedic Surgery

## 2018-03-31 ENCOUNTER — Other Ambulatory Visit: Payer: Self-pay

## 2018-03-31 ENCOUNTER — Encounter (HOSPITAL_COMMUNITY): Payer: Self-pay

## 2018-03-31 DIAGNOSIS — M1712 Unilateral primary osteoarthritis, left knee: Secondary | ICD-10-CM | POA: Insufficient documentation

## 2018-03-31 DIAGNOSIS — M25562 Pain in left knee: Secondary | ICD-10-CM | POA: Diagnosis not present

## 2018-03-31 DIAGNOSIS — Z01812 Encounter for preprocedural laboratory examination: Secondary | ICD-10-CM | POA: Insufficient documentation

## 2018-03-31 HISTORY — DX: Unspecified osteoarthritis, unspecified site: M19.90

## 2018-03-31 LAB — BASIC METABOLIC PANEL
ANION GAP: 8 (ref 5–15)
BUN: 17 mg/dL (ref 6–20)
CALCIUM: 9.2 mg/dL (ref 8.9–10.3)
CO2: 25 mmol/L (ref 22–32)
CREATININE: 1.03 mg/dL (ref 0.61–1.24)
Chloride: 107 mmol/L (ref 101–111)
GFR calc Af Amer: >60 mL/min (ref >60)
GLUCOSE: 112 mg/dL — AB (ref 65–99)
Potassium: 3.7 mmol/L (ref 3.5–5.1)
Sodium: 140 mmol/L (ref 135–145)

## 2018-03-31 LAB — CBC
HCT: 42.2 % (ref 39.0–52.0)
HEMOGLOBIN: 14 g/dL (ref 13.0–17.0)
MCH: 29.4 pg (ref 26.0–34.0)
MCHC: 33.2 g/dL (ref 30.0–36.0)
MCV: 88.7 fL (ref 78.0–100.0)
PLATELETS: 280 10*3/uL (ref 150–400)
RBC: 4.76 MIL/uL (ref 4.22–5.81)
RDW: 14 % (ref 11.5–15.5)
WBC: 5.7 10*3/uL (ref 4.0–10.5)

## 2018-03-31 LAB — SURGICAL PCR SCREEN
MRSA, PCR: NEGATIVE
STAPHYLOCOCCUS AUREUS: NEGATIVE

## 2018-04-11 MED ORDER — TRANEXAMIC ACID 1000 MG/10ML IV SOLN
1000.0000 mg | INTRAVENOUS | Status: AC
  Administered 2018-04-12: 1000 mg via INTRAVENOUS
  Filled 2018-04-11: qty 1100

## 2018-04-12 ENCOUNTER — Encounter (HOSPITAL_COMMUNITY)
Admission: RE | Disposition: A | Discharge status: Home / Self Care | Payer: Self-pay | Source: Ambulatory Visit | Attending: Orthopedic Surgery

## 2018-04-12 ENCOUNTER — Ambulatory Visit (HOSPITAL_COMMUNITY): Payer: BLUE CROSS/BLUE SHIELD | Admitting: Anesthesiology

## 2018-04-12 ENCOUNTER — Encounter (HOSPITAL_COMMUNITY): Payer: Self-pay | Admitting: Anesthesiology

## 2018-04-12 ENCOUNTER — Observation Stay (HOSPITAL_COMMUNITY)
Admission: RE | Admit: 2018-04-12 | Discharge: 2018-04-13 | Disposition: A | Discharge status: Home / Self Care | Payer: BLUE CROSS/BLUE SHIELD | Source: Ambulatory Visit | Attending: Orthopedic Surgery | Admitting: Orthopedic Surgery

## 2018-04-12 ENCOUNTER — Other Ambulatory Visit: Payer: Self-pay

## 2018-04-12 DIAGNOSIS — M1712 Unilateral primary osteoarthritis, left knee: Secondary | ICD-10-CM | POA: Diagnosis present

## 2018-04-12 DIAGNOSIS — E669 Obesity, unspecified: Secondary | ICD-10-CM | POA: Diagnosis present

## 2018-04-12 DIAGNOSIS — Z7982 Long term (current) use of aspirin: Secondary | ICD-10-CM | POA: Diagnosis not present

## 2018-04-12 DIAGNOSIS — Z96652 Presence of left artificial knee joint: Secondary | ICD-10-CM

## 2018-04-12 DIAGNOSIS — Z79899 Other long term (current) drug therapy: Secondary | ICD-10-CM | POA: Insufficient documentation

## 2018-04-12 DIAGNOSIS — Z6832 Body mass index (BMI) 32.0-32.9, adult: Secondary | ICD-10-CM | POA: Diagnosis not present

## 2018-04-12 DIAGNOSIS — G20 Parkinson's disease: Secondary | ICD-10-CM | POA: Diagnosis not present

## 2018-04-12 DIAGNOSIS — F329 Major depressive disorder, single episode, unspecified: Secondary | ICD-10-CM | POA: Diagnosis not present

## 2018-04-12 HISTORY — PX: TOTAL KNEE ARTHROPLASTY: SHX125

## 2018-04-12 LAB — TYPE AND SCREEN
ABO/RH(D): O POS
Antibody Screen: NEGATIVE

## 2018-04-12 SURGERY — ARTHROPLASTY, KNEE, TOTAL
Anesthesia: Spinal | Site: Knee | Laterality: Left

## 2018-04-12 MED ORDER — HYDROCODONE-ACETAMINOPHEN 7.5-325 MG PO TABS: 1.0000 | ORAL_TABLET | ORAL | Status: DC | PRN

## 2018-04-12 MED ORDER — CARBIDOPA-LEVODOPA 25-100 MG PO TABS
1.0000 | ORAL_TABLET | ORAL | Status: DC
  Administered 2018-04-12 – 2018-04-13 (×6): 1 via ORAL
  Filled 2018-04-12 (×7): qty 1

## 2018-04-12 MED ORDER — HYDROMORPHONE HCL 1 MG/ML IJ SOLN: 0.2500 mg | INTRAMUSCULAR | Status: DC | PRN

## 2018-04-12 MED ORDER — ONDANSETRON HCL 4 MG PO TABS: 4.0000 mg | ORAL_TABLET | Freq: Four times a day (QID) | ORAL | Status: DC | PRN

## 2018-04-12 MED ORDER — SODIUM CHLORIDE 0.9 % IV SOLN
INTRAVENOUS | Status: DC
  Administered 2018-04-12 – 2018-04-13 (×2): via INTRAVENOUS

## 2018-04-12 MED ORDER — HYDROCODONE-ACETAMINOPHEN 5-325 MG PO TABS
1.0000 | ORAL_TABLET | ORAL | Status: DC | PRN
  Administered 2018-04-12: 2 via ORAL
  Filled 2018-04-12: qty 2

## 2018-04-12 MED ORDER — KETOROLAC TROMETHAMINE 30 MG/ML IJ SOLN
INTRAMUSCULAR | Status: DC | PRN
  Administered 2018-04-12: 30 mg

## 2018-04-12 MED ORDER — MORPHINE SULFATE (PF) 2 MG/ML IV SOLN
0.5000 mg | INTRAVENOUS | Status: DC | PRN
  Administered 2018-04-12: 1 mg via INTRAVENOUS
  Filled 2018-04-12: qty 1

## 2018-04-12 MED ORDER — CEFAZOLIN SODIUM-DEXTROSE 2-4 GM/100ML-% IV SOLN
2.0000 g | INTRAVENOUS | Status: AC
  Administered 2018-04-12: 2 g via INTRAVENOUS
  Filled 2018-04-12: qty 100

## 2018-04-12 MED ORDER — 0.9 % SODIUM CHLORIDE (POUR BTL) OPTIME
TOPICAL | Status: DC | PRN
  Administered 2018-04-12: 1000 mL

## 2018-04-12 MED ORDER — FERROUS SULFATE 325 (65 FE) MG PO TABS: 325.0000 mg | ORAL_TABLET | Freq: Three times a day (TID) | ORAL | 3 refills | Status: DC

## 2018-04-12 MED ORDER — KETOROLAC TROMETHAMINE 30 MG/ML IJ SOLN
INTRAMUSCULAR | Status: AC
  Filled 2018-04-12: qty 1

## 2018-04-12 MED ORDER — MENTHOL 3 MG MT LOZG: 1.0000 | LOZENGE | OROMUCOSAL | Status: DC | PRN

## 2018-04-12 MED ORDER — ONDANSETRON HCL 4 MG/2ML IJ SOLN: 4.0000 mg | Freq: Four times a day (QID) | INTRAMUSCULAR | Status: DC | PRN

## 2018-04-12 MED ORDER — POLYETHYLENE GLYCOL 3350 17 G PO PACK
17.0000 g | PACK | Freq: Two times a day (BID) | ORAL | Status: DC
  Administered 2018-04-12 – 2018-04-13 (×2): 17 g via ORAL
  Filled 2018-04-12 (×2): qty 1

## 2018-04-12 MED ORDER — DOCUSATE SODIUM 100 MG PO CAPS: 100.0000 mg | ORAL_CAPSULE | Freq: Two times a day (BID) | ORAL | 0 refills | Status: DC

## 2018-04-12 MED ORDER — FERROUS SULFATE 325 (65 FE) MG PO TABS
325.0000 mg | ORAL_TABLET | Freq: Three times a day (TID) | ORAL | Status: DC
  Administered 2018-04-13 (×2): 325 mg via ORAL
  Filled 2018-04-12 (×2): qty 1

## 2018-04-12 MED ORDER — BUPIVACAINE-EPINEPHRINE (PF) 0.25% -1:200000 IJ SOLN
INTRAMUSCULAR | Status: AC
  Filled 2018-04-12: qty 30

## 2018-04-12 MED ORDER — METHOCARBAMOL 500 MG PO TABS
500.0000 mg | ORAL_TABLET | Freq: Four times a day (QID) | ORAL | Status: DC | PRN
  Administered 2018-04-12: 500 mg via ORAL
  Filled 2018-04-12: qty 1

## 2018-04-12 MED ORDER — POLYETHYLENE GLYCOL 3350 17 G PO PACK: 17.0000 g | PACK | Freq: Two times a day (BID) | ORAL | 0 refills | Status: DC

## 2018-04-12 MED ORDER — ASPIRIN 81 MG PO CHEW: 81.0000 mg | CHEWABLE_TABLET | Freq: Two times a day (BID) | ORAL | 0 refills | Status: AC

## 2018-04-12 MED ORDER — DIPHENHYDRAMINE HCL 12.5 MG/5ML PO ELIX
12.5000 mg | ORAL_SOLUTION | ORAL | Status: DC | PRN
  Administered 2018-04-13: 12.5 mg via ORAL
  Filled 2018-04-12: qty 5

## 2018-04-12 MED ORDER — BUPIVACAINE IN DEXTROSE 0.75-8.25 % IT SOLN
INTRATHECAL | Status: DC | PRN
  Administered 2018-04-12: 2 mL via INTRATHECAL

## 2018-04-12 MED ORDER — CELECOXIB 200 MG PO CAPS
200.0000 mg | ORAL_CAPSULE | Freq: Two times a day (BID) | ORAL | Status: DC
  Administered 2018-04-12 – 2018-04-13 (×2): 200 mg via ORAL
  Filled 2018-04-12 (×2): qty 1

## 2018-04-12 MED ORDER — BUPIVACAINE-EPINEPHRINE (PF) 0.25% -1:200000 IJ SOLN
INTRAMUSCULAR | Status: DC | PRN
  Administered 2018-04-12: 30 mL

## 2018-04-12 MED ORDER — BISACODYL 10 MG RE SUPP
10.0000 mg | Freq: Every day | RECTAL | Status: DC | PRN
Start: 2018-04-12 — End: 2018-04-13

## 2018-04-12 MED ORDER — SODIUM CHLORIDE 0.9 % IV SOLN
1000.0000 mg | Freq: Once | INTRAVENOUS | Status: AC
  Administered 2018-04-12: 1000 mg via INTRAVENOUS
  Filled 2018-04-12: qty 1100

## 2018-04-12 MED ORDER — STERILE WATER FOR IRRIGATION IR SOLN
Status: DC | PRN
  Administered 2018-04-12: 2000 mL

## 2018-04-12 MED ORDER — METHOCARBAMOL 500 MG PO TABS: 500.0000 mg | ORAL_TABLET | Freq: Four times a day (QID) | ORAL | 0 refills | Status: DC | PRN

## 2018-04-12 MED ORDER — ALUM & MAG HYDROXIDE-SIMETH 200-200-20 MG/5ML PO SUSP: 15.0000 mL | ORAL | Status: DC | PRN

## 2018-04-12 MED ORDER — DEXAMETHASONE SODIUM PHOSPHATE 10 MG/ML IJ SOLN
10.0000 mg | Freq: Once | INTRAMUSCULAR | Status: AC
  Administered 2018-04-12: 10 mg via INTRAVENOUS

## 2018-04-12 MED ORDER — SODIUM CHLORIDE 0.9 % IR SOLN
Status: DC | PRN
  Administered 2018-04-12: 1000 mL

## 2018-04-12 MED ORDER — ONDANSETRON HCL 4 MG/2ML IJ SOLN
INTRAMUSCULAR | Status: DC | PRN
  Administered 2018-04-12: 4 mg via INTRAVENOUS

## 2018-04-12 MED ORDER — RASAGILINE MESYLATE 1 MG PO TABS
1.0000 mg | ORAL_TABLET | Freq: Every day | ORAL | Status: DC
  Administered 2018-04-13: 1 mg via ORAL
  Filled 2018-04-12: qty 1

## 2018-04-12 MED ORDER — MEPERIDINE HCL 50 MG/ML IJ SOLN: 6.2500 mg | INTRAMUSCULAR | Status: DC | PRN

## 2018-04-12 MED ORDER — CEFAZOLIN SODIUM-DEXTROSE 2-4 GM/100ML-% IV SOLN
2.0000 g | Freq: Four times a day (QID) | INTRAVENOUS | Status: AC
  Administered 2018-04-12 (×2): 2 g via INTRAVENOUS
  Filled 2018-04-12 (×2): qty 100

## 2018-04-12 MED ORDER — CARBIDOPA-LEVODOPA ER 50-200 MG PO TBCR
1.0000 | EXTENDED_RELEASE_TABLET | Freq: Every day | ORAL | Status: DC
  Administered 2018-04-12: 1 via ORAL
  Filled 2018-04-12: qty 1

## 2018-04-12 MED ORDER — MIDAZOLAM HCL 2 MG/2ML IJ SOLN
1.0000 mg | INTRAMUSCULAR | Status: DC
Start: 2018-04-12 — End: 2018-04-12
  Administered 2018-04-12: 2 mg via INTRAVENOUS
  Filled 2018-04-12: qty 2

## 2018-04-12 MED ORDER — DEXAMETHASONE SODIUM PHOSPHATE 10 MG/ML IJ SOLN
10.0000 mg | Freq: Once | INTRAMUSCULAR | Status: AC
  Administered 2018-04-13: 10 mg via INTRAVENOUS
  Filled 2018-04-12: qty 1

## 2018-04-12 MED ORDER — DOCUSATE SODIUM 100 MG PO CAPS
100.0000 mg | ORAL_CAPSULE | Freq: Two times a day (BID) | ORAL | Status: DC
  Administered 2018-04-12 – 2018-04-13 (×2): 100 mg via ORAL
  Filled 2018-04-12 (×2): qty 1

## 2018-04-12 MED ORDER — HYDROCODONE-ACETAMINOPHEN 7.5-325 MG PO TABS: 1.0000 | ORAL_TABLET | ORAL | 0 refills | Status: DC | PRN

## 2018-04-12 MED ORDER — PROPOFOL 500 MG/50ML IV EMUL
INTRAVENOUS | Status: DC | PRN
  Administered 2018-04-12: 80 ug/kg/min via INTRAVENOUS

## 2018-04-12 MED ORDER — MAGNESIUM CITRATE PO SOLN: 1.0000 | Freq: Once | ORAL | Status: DC | PRN

## 2018-04-12 MED ORDER — PRAMIPEXOLE DIHYDROCHLORIDE 0.25 MG PO TABS
1.0000 mg | ORAL_TABLET | ORAL | Status: DC
  Administered 2018-04-12 – 2018-04-13 (×3): 1 mg via ORAL
  Filled 2018-04-12 (×4): qty 4

## 2018-04-12 MED ORDER — PROPOFOL 10 MG/ML IV BOLUS
INTRAVENOUS | Status: DC | PRN
  Administered 2018-04-12: 10 mg via INTRAVENOUS
  Administered 2018-04-12 (×2): 20 mg via INTRAVENOUS

## 2018-04-12 MED ORDER — CEPHALEXIN 500 MG PO CAPS: 500.0000 mg | ORAL_CAPSULE | Freq: Three times a day (TID) | ORAL | 0 refills | Status: AC

## 2018-04-12 MED ORDER — ASPIRIN 81 MG PO CHEW
81.0000 mg | CHEWABLE_TABLET | Freq: Two times a day (BID) | ORAL | Status: DC
  Administered 2018-04-12 – 2018-04-13 (×2): 81 mg via ORAL
  Filled 2018-04-12 (×2): qty 1

## 2018-04-12 MED ORDER — METHOCARBAMOL 1000 MG/10ML IJ SOLN
500.0000 mg | Freq: Four times a day (QID) | INTRAVENOUS | Status: DC | PRN
  Filled 2018-04-12: qty 5

## 2018-04-12 MED ORDER — FENTANYL CITRATE (PF) 100 MCG/2ML IJ SOLN
50.0000 ug | INTRAMUSCULAR | Status: DC
  Administered 2018-04-12 (×2): 50 ug via INTRAVENOUS
  Filled 2018-04-12: qty 2

## 2018-04-12 MED ORDER — METOCLOPRAMIDE HCL 5 MG PO TABS: 5.0000 mg | ORAL_TABLET | Freq: Three times a day (TID) | ORAL | Status: DC | PRN

## 2018-04-12 MED ORDER — SODIUM CHLORIDE 0.9 % IJ SOLN
INTRAMUSCULAR | Status: AC
  Filled 2018-04-12: qty 50

## 2018-04-12 MED ORDER — PHENOL 1.4 % MT LIQD: 1.0000 | OROMUCOSAL | Status: DC | PRN

## 2018-04-12 MED ORDER — VALACYCLOVIR HCL 500 MG PO TABS: 1000.0000 mg | ORAL_TABLET | ORAL | Status: DC | PRN

## 2018-04-12 MED ORDER — METOCLOPRAMIDE HCL 5 MG/ML IJ SOLN: 5.0000 mg | Freq: Three times a day (TID) | INTRAMUSCULAR | Status: DC | PRN

## 2018-04-12 MED ORDER — PROPOFOL 10 MG/ML IV BOLUS
INTRAVENOUS | Status: AC
  Filled 2018-04-12: qty 60

## 2018-04-12 MED ORDER — CHLORHEXIDINE GLUCONATE 4 % EX LIQD: 60.0000 mL | Freq: Once | CUTANEOUS | Status: DC

## 2018-04-12 MED ORDER — SODIUM CHLORIDE 0.9 % IJ SOLN
INTRAMUSCULAR | Status: DC | PRN
  Administered 2018-04-12: 29 mL

## 2018-04-12 MED ORDER — ONDANSETRON HCL 4 MG/2ML IJ SOLN: 4.0000 mg | Freq: Once | INTRAMUSCULAR | Status: DC | PRN

## 2018-04-12 MED ORDER — ACETAMINOPHEN 325 MG PO TABS: 325.0000 mg | ORAL_TABLET | Freq: Four times a day (QID) | ORAL | Status: DC | PRN

## 2018-04-12 MED ORDER — LACTATED RINGERS IV SOLN
INTRAVENOUS | Status: DC
  Administered 2018-04-12 (×2): via INTRAVENOUS

## 2018-04-12 SURGICAL SUPPLY — 52 items
BAG ZIPLOCK 12X15 (MISCELLANEOUS) ×3 IMPLANT
BANDAGE ACE 6X5 VEL STRL LF (GAUZE/BANDAGES/DRESSINGS) ×3 IMPLANT
BLADE SAW SGTL 13.0X1.19X90.0M (BLADE) ×3 IMPLANT
BOWL SMART MIX CTS (DISPOSABLE) ×3 IMPLANT
CAPT KNEE TOTAL 3 ATTUNE ×3 IMPLANT
CEMENT HV SMART SET (Cement) ×6 IMPLANT
COVER SURGICAL LIGHT HANDLE (MISCELLANEOUS) ×3 IMPLANT
CUFF TOURN SGL QUICK 34 (TOURNIQUET CUFF) ×2
CUFF TRNQT CYL 34X4X40X1 (TOURNIQUET CUFF) ×1 IMPLANT
DECANTER SPIKE VIAL GLASS SM (MISCELLANEOUS) ×6 IMPLANT
DERMABOND ADVANCED (GAUZE/BANDAGES/DRESSINGS) ×2
DERMABOND ADVANCED .7 DNX12 (GAUZE/BANDAGES/DRESSINGS) ×1 IMPLANT
DRAPE U-SHAPE 47X51 STRL (DRAPES) ×3 IMPLANT
DRESSING AQUACEL AG SP 3.5X10 (GAUZE/BANDAGES/DRESSINGS) ×1 IMPLANT
DRSG AQUACEL AG SP 3.5X10 (GAUZE/BANDAGES/DRESSINGS) ×3
DURAPREP 26ML APPLICATOR (WOUND CARE) ×6 IMPLANT
ELECT REM PT RETURN 15FT ADLT (MISCELLANEOUS) ×3 IMPLANT
GLOVE BIOGEL PI IND STRL 7.0 (GLOVE) ×2 IMPLANT
GLOVE BIOGEL PI IND STRL 7.5 (GLOVE) ×5 IMPLANT
GLOVE BIOGEL PI IND STRL 8.5 (GLOVE) ×1 IMPLANT
GLOVE BIOGEL PI IND STRL 9 (GLOVE) ×1 IMPLANT
GLOVE BIOGEL PI INDICATOR 7.0 (GLOVE) ×4
GLOVE BIOGEL PI INDICATOR 7.5 (GLOVE) ×10
GLOVE BIOGEL PI INDICATOR 8.5 (GLOVE) ×2
GLOVE BIOGEL PI INDICATOR 9 (GLOVE) ×2
GLOVE ECLIPSE 8.0 STRL XLNG CF (GLOVE) ×3 IMPLANT
GLOVE ECLIPSE 8.5 STRL (GLOVE) ×3 IMPLANT
GLOVE ORTHO TXT STRL SZ7.5 (GLOVE) ×6 IMPLANT
GOWN STRL REUS W/ TWL XL LVL3 (GOWN DISPOSABLE) ×1 IMPLANT
GOWN STRL REUS W/TWL 2XL LVL3 (GOWN DISPOSABLE) ×6 IMPLANT
GOWN STRL REUS W/TWL LRG LVL3 (GOWN DISPOSABLE) ×6 IMPLANT
GOWN STRL REUS W/TWL XL LVL3 (GOWN DISPOSABLE) ×2
HANDPIECE INTERPULSE COAX TIP (DISPOSABLE) ×2
HOLDER FOLEY CATH W/STRAP (MISCELLANEOUS) ×3 IMPLANT
MANIFOLD NEPTUNE II (INSTRUMENTS) ×3 IMPLANT
NDL SAFETY ECLIPSE 18X1.5 (NEEDLE) IMPLANT
NEEDLE HYPO 18GX1.5 SHARP (NEEDLE)
PACK TOTAL KNEE CUSTOM (KITS) ×3 IMPLANT
POSITIONER SURGICAL ARM (MISCELLANEOUS) ×3 IMPLANT
SET HNDPC FAN SPRY TIP SCT (DISPOSABLE) ×1 IMPLANT
SET PAD KNEE POSITIONER (MISCELLANEOUS) ×3 IMPLANT
SUT MNCRL AB 4-0 PS2 18 (SUTURE) ×3 IMPLANT
SUT STRATAFIX PDS+ 0 24IN (SUTURE) ×3 IMPLANT
SUT VIC AB 1 CT1 36 (SUTURE) ×6 IMPLANT
SUT VIC AB 2-0 CT1 27 (SUTURE) ×6
SUT VIC AB 2-0 CT1 TAPERPNT 27 (SUTURE) ×3 IMPLANT
SYR 3ML LL SCALE MARK (SYRINGE) ×3 IMPLANT
SYR 50ML LL SCALE MARK (SYRINGE) ×3 IMPLANT
TRAY FOLEY CATH 14FR (SET/KITS/TRAYS/PACK) ×3 IMPLANT
TRAY FOLEY MTR SLVR 16FR STAT (SET/KITS/TRAYS/PACK) IMPLANT
WRAP KNEE MAXI GEL POST OP (GAUZE/BANDAGES/DRESSINGS) ×3 IMPLANT
YANKAUER SUCT BULB TIP 10FT TU (MISCELLANEOUS) ×3 IMPLANT

## 2018-04-12 NOTE — Discharge Instructions (Signed)

## 2018-04-12 NOTE — Anesthesia Preprocedure Evaluation (Signed)
Anesthesia Evaluation  Patient identified by MRN, date of birth, ID band Patient awake    Reviewed: Allergy & Precautions, NPO status , Patient's Chart, lab work & pertinent test results  Airway Mallampati: I  TM Distance: >3 FB Neck ROM: Full    Dental   Pulmonary    Pulmonary exam normal        Cardiovascular Normal cardiovascular exam     Neuro/Psych Depression    GI/Hepatic   Endo/Other    Renal/GU      Musculoskeletal   Abdominal   Peds  Hematology   Anesthesia Other Findings   Reproductive/Obstetrics                             Anesthesia Physical Anesthesia Plan  ASA: II  Anesthesia Plan: Spinal   Post-op Pain Management:  Regional for Post-op pain   Induction: Intravenous  PONV Risk Score and Plan: 1 and Ondansetron and Midazolam  Airway Management Planned:   Additional Equipment:   Intra-op Plan:   Post-operative Plan:   Informed Consent: I have reviewed the patients History and Physical, chart, labs and discussed the procedure including the risks, benefits and alternatives for the proposed anesthesia with the patient or authorized representative who has indicated his/her understanding and acceptance.     Plan Discussed with: CRNA and Surgeon  Anesthesia Plan Comments:         Anesthesia Quick Evaluation

## 2018-04-12 NOTE — Anesthesia Procedure Notes (Signed)
Spinal  Patient location during procedure: OR Start time: 04/12/2018 10:07 AM End time: 04/12/2018 10:10 AM Staffing Anesthesiologist: Arta Bruce, MD Performed: anesthesiologist  Preanesthetic Checklist Completed: patient identified, surgical consent, pre-op evaluation, timeout performed, IV checked, risks and benefits discussed and monitors and equipment checked Spinal Block Patient position: sitting Prep: DuraPrep Patient monitoring: blood pressure, continuous pulse ox, cardiac monitor and heart rate Approach: right paramedian Location: L3-4 Injection technique: single-shot Needle Needle type: Pencan  Needle gauge: 24 G Needle length: 9 cm Needle insertion depth: 7 cm

## 2018-04-12 NOTE — Plan of Care (Signed)
Plan of care 

## 2018-04-12 NOTE — Op Note (Signed)
NAME:  Antonio [name]                      MEDICAL RECORD NO.:  [id number]                             FACILITY:  [facility]      PHYSICIAN:  Madlyn Frankel. Charlann Boxer, M.D.  DATE OF BIRTH:  01/18/1961      DATE OF PROCEDURE:  04/12/2018                                     OPERATIVE REPORT         PREOPERATIVE DIAGNOSIS:  Left knee osteoarthritis.      POSTOPERATIVE DIAGNOSIS:  Left knee osteoarthritis.      FINDINGS:  The patient was noted to have complete loss of cartilage and   bone-on-bone arthritis with associated osteophytes in the medial and patellofemoral compartments of   the knee with a significant synovitis and associated effusion.      PROCEDURE:  Left total knee replacement.      COMPONENTS USED:  DePuy Attune rotating platform posterior stabilized knee   system, a size 8 femur, 8 tibia, size 8 mm PS AOX insert, and 41 anatomic patellar   button.      SURGEON:  Madlyn Frankel. Charlann Boxer, M.D.      ASSISTANT:  Lanney Gins, PA-C.      ANESTHESIA:  Regional and Spinal.      SPECIMENS:  None.      COMPLICATION:  None.      DRAINS:  None.  EBL: <200cc      TOURNIQUET TIME:  30 min at     The patient was stable to the recovery room.      INDICATION FOR PROCEDURE:  Antonio [name] is a 57 y.o. male patient of   mine.  The patient had been seen, evaluated, and treated conservatively in the   office with medication, activity modification, and injections.  The patient had   radiographic changes of bone-on-bone arthritis with endplate sclerosis and osteophytes noted.      The patient failed conservative measures including medication, injections, and activity modification, and at this point was ready for more definitive measures.   Based on the radiographic changes and failed conservative measures, the patient   decided to proceed with total knee replacement.  Risks of infection,   DVT, component failure, need for revision surgery, postop course, and   expectations were all   discussed and reviewed.  Consent was obtained for benefit of pain   relief.      PROCEDURE IN DETAIL:  The patient was brought to the operative theater.   Once adequate anesthesia, preoperative antibiotics, 2 gm of Ancef, 1 gm of Tranexamic Acid, and 10 mg of Decadron administered, the patient was positioned supine with the left thigh tourniquet placed.  The  left lower extremity was prepped and draped in sterile fashion.  A time-   out was performed identifying the patient, planned procedure, and   extremity.      The left lower extremity was placed in the [facility] leg holder.  The leg was   exsanguinated, tourniquet elevated to 250 mmHg.  A midline incision was   made followed by median parapatellar arthrotomy.  Following initial   exposure, attention was first  directed to the patella.  Precut   measurement was noted to be 26 mm.  I resected down to 15 mm and used a   41 anatomic patellar button to restore patellar height as well as cover the cut   surface.      The lug holes were drilled and a metal shim was placed to protect the   patella from retractors and saw blades.      At this point, attention was now directed to the femur.  The femoral   canal was opened with a drill, irrigated to try to prevent fat emboli.  An   intramedullary rod was passed at 3 degrees valgus, 9 mm of bone was   resected off the distal femur.  Following this resection, the tibia was   subluxated anteriorly.  Using the extramedullary guide, 2 mm of bone was resected off   the proximal medial tibia.  We confirmed the gap would be   stable medially and laterally with a size 5 spacer block as well as confirmed   the cut was perpendicular in the coronal plane, checking with an alignment rod.      Once this was done, I sized the femur to be a size 8 in the anterior-   posterior dimension, chose a standard component based on medial and   lateral dimension.  The size 8 rotation block was then pinned in   position  anterior referenced using the C-clamp to set rotation.  The   anterior, posterior, and  chamfer cuts were made without difficulty nor   notching making certain that I was along the anterior cortex to help   with flexion gap stability.      The final box cut was made off the lateral aspect of distal femur.      At this point, the tibia was sized to be a size 8, the size 8 tray was   then pinned in position through the medial third of the tubercle,   drilled, and keel punched.  Trial reduction was now carried with a 8 femur,  8 tibia, a size 6 then 8 mm PS insert, and the 41 anatomic patella botton.  The knee was brought to   extension, full extension with good flexion stability with the patella   tracking through the trochlea without application of pressure.  Given   all these findings, the trial components removed.  Final components were   opened and cement was mixed.  The knee was irrigated with normal saline   solution and pulse lavage.  The synovial lining was   then injected with 30 cc of 0.25% Marcaine with epinephrine and 1 cc of Toradol plus 30 cc of NS for a total of 61 cc.      The knee was irrigated.  Final implants were then cemented onto clean and   dried cut surfaces of bone with the knee brought to extension with a size 8   mm PS trial insert.      Once the cement had fully cured, the excess cement was removed   throughout the knee.  I confirmed I was satisfied with the range of   motion and stability, and the final size 8 mm PS AOX insert was chosen.  It was   placed into the knee.      The tourniquet had been let down at 30 minutes.  No significant   hemostasis required.  The   extensor mechanism was then reapproximated using #  1 Vicryl and #1 Sratafix sutures with the knee   in flexion.  The   remaining wound was closed with 2-0 Vicryl and running 4-0 Monocryl.   The knee was cleaned, dried, dressed sterilely using Dermabond and   Aquacel dressing.  The patient was  then   brought to recovery room in stable condition, tolerating the procedure   well.   Please note that Physician Assistant, Lanney Gins, PA-C, was present for the entirety of the case, and was utilized for pre-operative positioning, peri-operative retractor management, general facilitation of the procedure.  He was also utilized for primary wound closure at the end of the case.              Madlyn Frankel Charlann Boxer, M.D.    04/12/2018 9:57 AM

## 2018-04-12 NOTE — Transfer of Care (Signed)
Immediate Anesthesia Transfer of Care Note  Patient: Antonio [name]  Procedure(s) Performed: Procedure(s) with comments: LEFT TOTAL KNEE ARTHROPLASTY (Left) - Adductor Block  Patient Location: PACU  Anesthesia Type:Spinal  Level of Consciousness:  sedated, patient cooperative and responds to stimulation  Airway & Oxygen Therapy:Patient Spontanous Breathing and Patient connected to face mask oxgen  Post-op Assessment:  Report given to PACU RN and Post -op Vital signs reviewed and stable  Post vital signs:  Reviewed and stable  Last Vitals:  Vitals:   04/12/18 0805  BP: 117/72  Pulse: 63  Resp: 18  Temp: 36.7 C  SpO2: 96%    Complications: No apparent anesthesia complications

## 2018-04-12 NOTE — Anesthesia Postprocedure Evaluation (Signed)
Anesthesia Post Note  Patient: Amani Nodarse [name]  Procedure(s) Performed: LEFT TOTAL KNEE ARTHROPLASTY (Left Knee)     Patient location during evaluation: PACU Anesthesia Type: Spinal Level of consciousness: oriented and awake and alert Pain management: pain level controlled Vital Signs Assessment: post-procedure vital signs reviewed and stable Respiratory status: spontaneous breathing, respiratory function stable and patient connected to nasal cannula oxygen Cardiovascular status: blood pressure returned to baseline and stable Postop Assessment: no headache, no backache and no apparent nausea or vomiting Anesthetic complications: no    Last Vitals:  Vitals:   04/12/18 1215 04/12/18 1230  BP: 127/65 118/81  Pulse: (!) 57 66  Resp: 14 16  Temp:    SpO2: 98% 99%    Last Pain:  Vitals:   04/12/18 1215  TempSrc:   PainSc: 0-No pain                 Ramyah Pankowski DAVID

## 2018-04-12 NOTE — Evaluation (Signed)
Physical Therapy Evaluation Patient Details Name: Antonio [name] MRN: [id number] DOB: Mar 16, 1961 Today's Date: 04/12/2018   History of Present Illness  57 y.o. 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 Pt ambulated 49' with RW and min assist for balance.  Pt will benefit from skilled PT to increase their independence and safety with mobility to allow discharge to the venue listed below.      Follow Up Recommendations Follow surgeon's recommendation for DC plan and follow-up therapies    Equipment Recommendations  None recommended by PT    Recommendations for Other Services       Precautions / Restrictions Precautions Precautions: Knee Precaution Booklet Issued: Yes (comment) Precaution Comments: reviewed no pillow under knee Restrictions Weight Bearing Restrictions: No Other Position/Activity Restrictions: WBAT      Mobility  Bed Mobility Overal bed mobility: Needs Assistance Bed Mobility: Supine to Sit;Sit to Supine     Supine to sit: Mod assist     General bed mobility comments: mod A to raise trunk  Transfers Overall transfer level: Needs assistance Equipment used: Rolling walker (2 wheeled) Transfers: Sit to/from Stand Sit to Stand: From elevated surface;Mod assist         General transfer comment: assist to rise, VCs hand placement  Ambulation/Gait Ambulation/Gait assistance: Min assist;Min guard Ambulation Distance (Feet): 35 Feet Assistive device: Rolling walker (2 wheeled) Gait Pattern/deviations: Step-to pattern;Decreased step length - right;Decreased step length - left, flexed trunk     General Gait Details: B decr step length, pt reports this is baseline depending on how well his Parkinsons meds work  Information systems manager Rankin (Stroke Patients Only)       Balance Overall  balance assessment: Needs assistance   Sitting balance-Leahy Scale: Good     Standing balance support: Bilateral upper extremity supported Standing balance-Leahy Scale: Poor Standing balance comment: relies on BUE support                             Pertinent Vitals/Pain Pain Assessment: 0-10 Pain Score: 3  Pain Location: L knee Pain Descriptors / Indicators: Sore Pain Intervention(s): Limited activity within patient's tolerance;Monitored during session;Premedicated before session;Repositioned;Ice applied    Home Living Family/patient expects to be discharged to:: Private residence Living Arrangements: Spouse/significant other;Other relatives Available Help at Discharge: Family Type of Home: House Home Access: Stairs to enter Entrance Stairs-Rails: Doctor, general practice of Steps: 3 Home Layout: Multi-level;Bed/bath upstairs;Full bath on main level Home Equipment: Grab bars - toilet;Grab bars - tub/shower;Shower seat - built in;Walker - 2 wheels;Bedside commode Additional Comments: pt just had bathroom updated    Prior Function Level of Independence: Independent               Hand Dominance        Extremity/Trunk Assessment   Upper Extremity Assessment Upper Extremity Assessment: Overall WFL for tasks assessed(resting tremor RUE)    Lower Extremity Assessment Lower Extremity Assessment: LLE deficits/detail LLE Deficits / Details: SLR 3/5, knee 10-55* AAROM, ankle WNL    Cervical / Trunk Assessment Cervical / Trunk Assessment: Normal  Communication   Communication: No difficulties  Cognition Arousal/Alertness: Awake/alert Behavior During Therapy: WFL for tasks assessed/performed Overall Cognitive Status: Within Functional Limits for tasks assessed  General Comments      Exercises Total Joint Exercises Ankle Circles/Pumps: AROM;Both;10 reps;Supine Quad Sets: AROM;Left;5  reps;Supine Heel Slides: AAROM;Left;10 reps;Supine Long Arc Quad: Left;AAROM;5 reps;Seated Goniometric ROM: 10-55* AAROM L knee   Assessment/Plan    PT Assessment Patient needs continued PT services  PT Problem List Decreased strength;Decreased range of motion;Decreased activity tolerance;Decreased mobility;Pain;Decreased balance       PT Treatment Interventions DME instruction;Gait training;Functional mobility training;Therapeutic activities;Therapeutic exercise;Stair training;Patient/family education    PT Goals (Current goals can be found in the Care Plan section)  Acute Rehab PT Goals Patient Stated Goal: return to [facility] making at [employer] [facility] PT Goal Formulation: With patient Time For Goal Achievement: 04/19/18 Potential to Achieve Goals: Good    Frequency 7X/week   Barriers to discharge        Co-evaluation               AM-PAC PT "6 Clicks" Daily Activity  Outcome Measure Difficulty turning over in bed (including adjusting bedclothes, sheets and blankets)?: Unable Difficulty moving from lying on back to sitting on the side of the bed? : Unable Difficulty sitting down on and standing up from a chair with arms (e.g., wheelchair, bedside commode, etc,.)?: Unable Help needed moving to and from a bed to chair (including a wheelchair)?: A Little Help needed walking in hospital room?: A Little Help needed climbing 3-5 steps with a railing? : A Lot 6 Click Score: 11    End of Session Equipment Utilized During Treatment: Gait belt Activity Tolerance: Patient tolerated treatment well Patient left: in chair;with call bell/phone within reach Nurse Communication: Mobility status PT Visit Diagnosis: Muscle weakness (generalized) (M62.81);Pain;Difficulty in walking, not elsewhere classified (R26.2) Pain - Right/Left: Left Pain - part of body: Knee    Time: 1610-9604 PT Time Calculation (min) (ACUTE ONLY): 26 min   Charges:   PT Evaluation $PT Eval Low  Complexity: 1 Low PT Treatments $Gait Training: 8-22 mins   PT G Codes:          Tamala Ser 04/12/2018, 4:49 PM [phone]

## 2018-04-12 NOTE — Interval H&P Note (Signed)
History and Physical Interval Note:  04/12/2018 8:28 AM  Antonio [name]  has presented today for surgery, with the diagnosis of Left knee osteoarthritis  The various methods of treatment have been discussed with the patient and family. After consideration of risks, benefits and other options for treatment, the patient has consented to  Procedure(s) with comments: LEFT TOTAL KNEE ARTHROPLASTY (Left) - 70 mins as a surgical intervention .  The patient's history has been reviewed, patient examined, no change in status, stable for surgery.  I have reviewed the patient's chart and labs.  Questions were answered to the patient's satisfaction.     [name]

## 2018-04-13 DIAGNOSIS — E669 Obesity, unspecified: Secondary | ICD-10-CM | POA: Diagnosis present

## 2018-04-13 DIAGNOSIS — M1712 Unilateral primary osteoarthritis, left knee: Secondary | ICD-10-CM | POA: Diagnosis not present

## 2018-04-13 LAB — BASIC METABOLIC PANEL
ANION GAP: 7 (ref 5–15)
BUN: 14 mg/dL (ref 6–20)
CALCIUM: 8.7 mg/dL — AB (ref 8.9–10.3)
CO2: 24 mmol/L (ref 22–32)
Chloride: 107 mmol/L (ref 101–111)
Creatinine, Ser: 0.94 mg/dL (ref 0.61–1.24)
Glucose, Bld: 133 mg/dL — ABNORMAL HIGH (ref 65–99)
POTASSIUM: 4.3 mmol/L (ref 3.5–5.1)
SODIUM: 138 mmol/L (ref 135–145)

## 2018-04-13 LAB — CBC
HCT: 38.7 % — ABNORMAL LOW (ref 39.0–52.0)
Hemoglobin: 12.5 g/dL — ABNORMAL LOW (ref 13.0–17.0)
MCH: 29.1 pg (ref 26.0–34.0)
MCHC: 32.3 g/dL (ref 30.0–36.0)
MCV: 90 fL (ref 78.0–100.0)
PLATELETS: 250 10*3/uL (ref 150–400)
RBC: 4.3 MIL/uL (ref 4.22–5.81)
RDW: 13.8 % (ref 11.5–15.5)
WBC: 16.8 10*3/uL — AB (ref 4.0–10.5)

## 2018-04-13 NOTE — Progress Notes (Signed)
Physical Therapy Treatment Patient Details Name: Antonio [name] MRN: [id number] DOB: October 16, 1961 Today's Date: 04/13/2018    History of Present Illness 57 y.o. 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 He ambulated 300' with RW, completed stair training, and demonstrates good understanding of HEP.   Follow Up Recommendations  Follow surgeon's recommendation for DC plan and follow-up therapies     Equipment Recommendations  None recommended by PT    Recommendations for Other Services       Precautions / Restrictions Precautions Precautions: Knee Precaution Booklet Issued: Yes (comment) Precaution Comments: reviewed no pillow under knee Restrictions Weight Bearing Restrictions: No Other Position/Activity Restrictions: WBAT    Mobility  Bed Mobility Overal bed mobility: Modified Independent Bed Mobility: Supine to Sit     Supine to sit: Modified independent (Device/Increase time);HOB elevated        Transfers Overall transfer level: Needs assistance Equipment used: Rolling walker (2 wheeled) Transfers: Sit to/from Stand Sit to Stand: From elevated surface;Supervision         General transfer comment: , VCs hand placement  Ambulation/Gait Ambulation/Gait assistance: Supervision Ambulation Distance (Feet): 300 Feet Assistive device: Rolling walker (2 wheeled) Gait Pattern/deviations: Decreased stride length;Step-through pattern     General Gait Details: steady with RW, no LOB   Stairs Stairs: Yes Stairs assistance: Supervision Stair Management: One rail Right;Forwards;With cane;Step to pattern Number of Stairs: 5 General stair comments: supervision for safety   Wheelchair Mobility    Modified Rankin (Stroke Patients Only)       Balance Overall balance assessment: Needs assistance   Sitting balance-Leahy Scale: Good      Standing balance support: Bilateral upper extremity supported Standing balance-Leahy Scale: Poor Standing balance comment: relies on BUE support                            Cognition Arousal/Alertness: Awake/alert Behavior During Therapy: WFL for tasks assessed/performed Overall Cognitive Status: Within Functional Limits for tasks assessed                                        Exercises Total Joint Exercises Ankle Circles/Pumps: AROM;Both;10 reps;Supine Quad Sets: AROM;Left;5 reps;Supine Short Arc Quad: AROM;Left;10 reps;Supine Heel Slides: AAROM;Left;10 reps;Supine Straight Leg Raises: AROM;Left;10 reps;Supine Long Arc Quad: Left;5 reps;Seated;AROM Knee Flexion: AAROM;Left;Seated;5 reps Goniometric ROM: 10-110* AAROM L knee    General Comments        Pertinent Vitals/Pain Pain Score: 1  Pain Descriptors / Indicators: Sore Pain Intervention(s): Limited activity within patient's tolerance;Monitored during session;Ice applied    Home Living                      Prior Function            PT Goals (current goals can now be found in the care plan section) Acute Rehab PT Goals Patient Stated Goal: return to [facility] making [place] [employer] [facility] PT Goal Formulation: With patient Time For Goal Achievement: 04/19/18 Potential to Achieve Goals: Good Progress towards PT goals: Progressing toward goals    Frequency    7X/week      PT Plan Current plan remains appropriate    Co-evaluation  AM-PAC PT "6 Clicks" Daily Activity  Outcome Measure  Difficulty turning over in bed (including adjusting bedclothes, sheets and blankets)?: None Difficulty moving from lying on back to sitting on the side of the bed? : A Little Difficulty sitting down on and standing up from a chair with arms (e.g., wheelchair, bedside commode, etc,.)?: A Little Help needed moving to and from a bed to chair (including a wheelchair)?: None Help  needed walking in hospital room?: None Help needed climbing 3-5 steps with a railing? : A Little 6 Click Score: 21    End of Session Equipment Utilized During Treatment: Gait belt Activity Tolerance: Patient tolerated treatment well Patient left: in chair;with call bell/phone within reach Nurse Communication: Mobility status PT Visit Diagnosis: Muscle weakness (generalized) (M62.81);Pain;Difficulty in walking, not elsewhere classified (R26.2) Pain - Right/Left: Left Pain - part of body: Knee     Time: 1027-1110 PT Time Calculation (min) (ACUTE ONLY): 43 min  Charges:  $Gait Training: 8-22 mins $Therapeutic Exercise: 8-22 mins $Therapeutic Activity: 8-22 mins                    G Codes:          Philomena Doheny 04/13/2018, 12:05 PM [phone]

## 2018-04-13 NOTE — Progress Notes (Signed)
     Subjective: 1 Day Post-Op Procedure(s) (LRB): LEFT TOTAL KNEE ARTHROPLASTY (Left)   Seen by Dr. Charlann Boxer Patient reports pain as mild, pain controlled. No events throughout the night.  Ready to be discharged home.    Patient's anticipated LOS is less than 2 midnights, meeting these requirements: - Younger than 3 - Lives within 1 hour of care - Has a competent adult at home to recover with post-op recover - NO history of  - Chronic pain requiring opiods  - Diabetes  - Coronary Artery Disease  - Heart failure  - Heart attack  - Stroke  - DVT/VTE  - Cardiac arrhythmia  - Respiratory Failure/COPD  - Renal failure  - Anemia  - Advanced Liver disease       Objective:   VITALS:   Vitals:   04/13/18 0228 04/13/18 0629  BP: 119/66 105/66  Pulse: 72 62  Resp: 16 16  Temp: 98.2 F (36.8 C) 97.7 F (36.5 C)  SpO2: 96% 98%    Dorsiflexion/Plantar flexion intact Incision: dressing C/D/I No cellulitis present Compartment soft  LABS Recent Labs    04/13/18 0545  HGB 12.5*  HCT 38.7*  WBC 16.8*  PLT 250    Recent Labs    04/13/18 0545  NA 138  K 4.3  BUN 14  CREATININE 0.94  GLUCOSE 133*     Assessment/Plan: 1 Day Post-Op Procedure(s) (LRB): LEFT TOTAL KNEE ARTHROPLASTY (Left) Foley cath d/c'ed Advance diet Up with therapy D/C IV fluids Discharge home Follow up in 2 weeks at [facility]). Follow up with OLIN,Erendira Crabtree D in 2 weeks.  Contact information:  [facility]) [address] [place] [address] [phone]    Obese (BMI 30-39.9) Estimated body mass index is 32.34 kg/m as calculated from the following:   Height as of this encounter: 6' 1.5" (1.867 m).   Weight as of this encounter: 112.7 kg (248 lb 8 oz). Patient also counseled that weight may inhibit the healing process Patient counseled that losing weight will help with future health issues           Anastasio Auerbach. Dawsyn Ramsaran   PAC  04/13/2018, 8:09 AM

## 2018-04-13 NOTE — Discharge Summary (Signed)
Physician Discharge Summary  Patient ID: Antonio [name] MRN: [id number] DOB/AGE: 1961/04/24 57 y.o.  Admit date: 04/12/2018 Discharge date:  04/13/2018  Procedures:  Procedure(s) (LRB): LEFT TOTAL KNEE ARTHROPLASTY (Left)  Attending Physician:  Dr. Durene Romans   Admission Diagnoses:   Left knee primary OA / pain  Discharge Diagnoses:  Principal Problem:   S/P left TKA Active Problems:   Obese  Past Medical History:  Diagnosis Date  . Arthritis   . Dyskinesia    occasionally   . Hx of colonic polyps 12/10/2010  . Leg pain left  . Parkinson's disease (HCC)     HPI:    Antonio [name], 57 y.o. male, has a history of pain and functional disability in the left knee due to arthritis and has failed non-surgical conservative treatments for greater than 12 weeks to include NSAID's and/or analgesics, corticosteriod injections, activity modification and aspirations.  Onset of symptoms was gradual, starting years ago with gradually worsening course since that time. The patient noted prior procedures on the knee to include  arthroplasty on the left knee per Dr. Charlann Boxer on September 28, 2017.  Patient currently rates pain in the left knee(s) at 9 out of 10 with activity. Patient has worsening of pain with activity and weight bearing, pain that interferes with activities of daily living, pain with passive range of motion, crepitus and joint swelling.  Patient has evidence of periarticular osteophytes and joint space narrowing by imaging studies.  There is no active infection.  Risks, benefits and expectations were discussed with the patient.  Risks including but not limited to the risk of anesthesia, blood clots, nerve damage, blood vessel damage, failure of the prosthesis, infection and up to and including death.  Patient understand the risks, benefits and expectations and wishes to proceed with surgery.   PCP: System, Provider Not In   Discharged Condition: good  Hospital Course:  Patient  underwent the above stated procedure on 04/12/2018. Patient tolerated the procedure well and brought to the recovery room in good condition and subsequently to the floor.  POD #1 BP: 105/66 ; Pulse: 62 ; Temp: 97.7 F (36.5 C) ; Resp: 16 Patient reports pain as mild, pain controlled. No events throughout the night.  Ready to be discharged home.  Dorsiflexion/plantar flexion intact, incision: dressing C/D/I, no cellulitis present and compartment soft.   LABS  Basename    HGB     12.5  HCT     38.7    Discharge Exam: General appearance: alert, cooperative and no distress Extremities: Homans sign is negative, no sign of DVT, no edema, redness or tenderness in the calves or thighs and no ulcers, gangrene or trophic changes  Disposition: Home with follow up in 2 weeks   Follow-up Information    Durene Romans, MD. Schedule an appointment as soon as possible for a visit in 2 weeks.   Specialty:  Orthopedic Surgery Contact information: [address] [place] [address] [phone]           Discharge Instructions    Call MD / Call 911   Complete by:  As directed    If you experience chest pain or shortness of breath, CALL 911 and be transported to the hospital emergency room.  If you develope a fever above 101 F, pus (white drainage) or increased drainage or redness at the wound, or calf pain, call your surgeon's office.   Change dressing   Complete by:  As directed  Maintain surgical dressing until follow up in the clinic. If the edges start to pull up, may reinforce with tape. If the dressing is no longer working, may remove and cover with gauze and tape, but must keep the area dry and clean.  Call with any questions or concerns.   Constipation Prevention   Complete by:  As directed    Drink plenty of fluids.  Prune juice may be helpful.  You may use a stool softener, such as Colace (over the counter) 100 mg twice a day.  Use MiraLax (over the counter) for  constipation as needed.   Diet - low sodium heart healthy   Complete by:  As directed    Discharge instructions   Complete by:  As directed    Maintain surgical dressing until follow up in the clinic. If the edges start to pull up, may reinforce with tape. If the dressing is no longer working, may remove and cover with gauze and tape, but must keep the area dry and clean.  Follow up in 2 weeks at [facility]. Call with any questions or concerns.   Increase activity slowly as tolerated   Complete by:  As directed    Weight bearing as tolerated with assist device (walker, cane, etc) as directed, use it as long as suggested by your surgeon or therapist, typically at least 4-6 weeks.   TED hose   Complete by:  As directed    Use stockings (TED hose) for 2 weeks on both leg(s).  You may remove them at night for sleeping.      Allergies as of 04/13/2018   No Known Allergies     Medication List    STOP taking these medications   baclofen 10 MG tablet Commonly known as:  LIORESAL   ibuprofen 200 MG tablet Commonly known as:  ADVIL,MOTRIN     TAKE these medications   aspirin 81 MG chewable tablet Commonly known as:  ASPIRIN CHILDRENS Chew 1 tablet (81 mg total) by mouth 2 (two) times daily. Take for 4 weeks, then resume regular dose.   carbidopa-levodopa 50-200 MG tablet Commonly known as:  SINEMET CR Take 1 tablet by mouth at bedtime.   carbidopa-levodopa 25-100 MG tablet Commonly known as:  SINEMET IR Take 1 tablet by mouth 5 (five) times daily. Take at 7 AM, 10 AM, 1 PM, 4 PM and 7 PM   cephALEXin 500 MG capsule Commonly known as:  KEFLEX Take 1 capsule (500 mg total) by mouth 3 (three) times daily for 15 days.   docusate sodium 100 MG capsule Commonly known as:  COLACE Take 1 capsule (100 mg total) by mouth 2 (two) times daily.   ferrous sulfate 325 (65 FE) MG tablet Commonly known as:  FERROUSUL Take 1 tablet (325 mg total) by mouth 3 (three) times daily  with meals.   HYDROcodone-acetaminophen 7.5-325 MG tablet Commonly known as:  NORCO Take 1-2 tablets by mouth every 4 (four) hours as needed for moderate pain. What changed:  reasons to take this   methocarbamol 500 MG tablet Commonly known as:  ROBAXIN Take 1 tablet (500 mg total) by mouth every 6 (six) hours as needed for muscle spasms.   polyethylene glycol packet Commonly known as:  MIRALAX / GLYCOLAX Take 17 g by mouth 2 (two) times daily.   pramipexole 1 MG tablet Commonly known as:  MIRAPEX Take 1 tablet (1 mg total) by mouth 3 (three) times daily. Taking at 8am, 12pm, and 5  pm   rasagiline 1 MG Tabs tablet Commonly known as:  AZILECT Take 1 tablet (1 mg total) by mouth daily.   VALTREX 1000 MG tablet Generic drug:  valACYclovir Take 1,000 mg by mouth as needed (cold sores).            Discharge Care Instructions  (From admission, onward)        Start     Ordered   04/13/18 0000  Change dressing    Comments:  Maintain surgical dressing until follow up in the clinic. If the edges start to pull up, may reinforce with tape. If the dressing is no longer working, may remove and cover with gauze and tape, but must keep the area dry and clean.  Call with any questions or concerns.   04/13/18 0813       Signed: Anastasio Auerbach. Jari Dipasquale   PA-C  04/13/2018, 8:13 AM

## 2018-04-14 MED ORDER — ROPIVACAINE HCL 7.5 MG/ML IJ SOLN
INTRAMUSCULAR | Status: DC | PRN
  Administered 2018-04-12: 20 mL via PERINEURAL

## 2018-04-14 NOTE — Addendum Note (Signed)
Addendum  created 04/14/18 1319 by Arta Bruce, MD   Child order released for a procedure order, Intraprocedure Blocks edited, Intraprocedure Meds edited, Order Canceled from Note, Sign clinical note

## 2018-04-14 NOTE — Anesthesia Procedure Notes (Signed)
Anesthesia Regional Block: Adductor canal block   Pre-Anesthetic Checklist: ,, timeout performed, Correct Patient, Correct Site, Correct Laterality, Correct Procedure, Correct Position, site marked, Risks and benefits discussed,  Surgical consent,  Pre-op evaluation,  At surgeon's request and post-op pain management  Laterality: Left  Prep: chloraprep       Needles:  Injection technique: Single-shot  Needle Type: Echogenic Stimulator Needle      Needle Gauge: 21     Additional Needles:   Narrative:  Start time: 04/12/2018 9:14 AM End time: 04/12/2018 9:24 AM Injection made incrementally with aspirations every 5 mL.  Performed by: Personally  Anesthesiologist: Arta Bruce, MD  Additional Notes: Monitors applied. Patient sedated. Sterile prep and drape,hand hygiene and sterile gloves were used. Relevant anatomy identified.Needle position confirmed.Local anesthetic injected incrementally after negative aspiration. Local anesthetic spread visualized around nerve(s). Vascular puncture avoided. No complications. Image printed for medical record.The patient tolerated the procedure well.    Arta Bruce MD

## 2018-07-20 ENCOUNTER — Ambulatory Visit: Payer: BLUE CROSS/BLUE SHIELD | Admitting: Neurology

## 2018-07-20 ENCOUNTER — Encounter: Payer: Self-pay | Admitting: Neurology

## 2018-07-20 VITALS — BP 124/80 | HR 69 | Ht 73.5 in | Wt 257.0 lb

## 2018-07-20 DIAGNOSIS — G20 Parkinson's disease: Secondary | ICD-10-CM

## 2018-07-20 MED ORDER — CARBIDOPA-LEVODOPA ER 50-200 MG PO TBCR: 1.0000 | EXTENDED_RELEASE_TABLET | Freq: Every day | ORAL | 3 refills | Status: DC

## 2018-07-20 NOTE — Progress Notes (Signed)
Subjective:    Patient ID: Antonio [name] is a 57 y.o. male.  HPI     Interim history:   Antonio [name] is a very pleasant 57 year-old right-handed gentleman with an underlying benign medical history, who presents for follow-up consultation of his right-sided predominant Parkinson's disease and his restless leg syndrome. The patient is unaccompanied today. I last saw him on 01/20/2018, at which time he was fairly stable. He had stopped taking Lexapro. He had undergone right total knee replacement surgery recently and was looking at left total knee replacement. He had DBS consultation at [facility] and was supposed to see a neurosurgeon next. He had a recent fall. Thankfully he did not injure himself. I suggested we continue his medication regimen.   Today, 07/20/2018 (all dictated new, as well as above notes, some dictation done in note pad or Word, outside of chart, may appear as copied):    He reports feeling well, stable. Doing exercises with his left knee similar to what he did through PT for his right knee. He had interim left total knee replacement surgery on 04/12/2018 under Dr. Alvan Dame. He was discharged on 04/13/18. He had left subthalamic DBS electrode placement on 07/14/2018, his generator placement is next month. FU after that with Dr. Linus Mako about 2 weeks later. Overall, he has recuperated well from his surgeries recently. Wound is healing well, had some discomfort around the left ear from where the electrode was tunneled. This subsided as well. He continues his current medication regimen.  The patient's allergies, current medications, family history, past medical history, past social history, past surgical history and problem list were reviewed and updated as appropriate.    Previously (copied from previous notes for reference):    I saw him on 09/22/2017, at which time he reported a fall. His right knee was bothering him. He was scheduled for right total knee replacement on 09/28/2017. He  was also scheduled for a DBS consultation at [facility].    I saw him on 06/22/2017, at which time he reported more off time. He felt he was more progressed in his symptoms. His wife reported that he had fallen several times. He had more recent stressors. His wife had noted more freezing. He was more irritable and frustrated easier. He had more issues with his right knee, most likely needing total knee replacement. I suggested we increase his Sinemet to 1 pill 5 times a day and keep his Mirapex 1 mg 3 times a day, Sinemet CR bedtime, and keep his Azilect the same. I suggested an opinion for DBS evaluation at [facility]. I also suggested he start taking Lexapro 5 mg strength.    I saw him on 02/16/2017, at which time he was on Sinemet 4 times a day and Sinemet CR at bedtime. He had to have interim right knee surgery after an injury. He was still on hydrocodone as needed for pain. 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 Lower extremity swelling was about the same, memory stable, no recent falls. No side effects from his medication. He was off of Azilect for his surgery and was able to restarted without problems after about 2 weeks. I asked him to continue with Mirapex 3 times a day, Sinemet CR bedtime, Sinemet IR 4 times a day and Azilect once daily.   I saw him on 05/12/2016, at which time he was reporting right leg pain and thigh pain. This seemed to be worse in between  Sinemet doses. He was on Sinemet 3 times a day but was not always keeping a set schedule for it. He was also taking the CR Sinemet at night more consistently. I suggested low-dose baclofen as needed 10 mg strength for his right leg muscle tension and pain. I suggested we continue with Azilect and Mirapex at the current doses but increase his Sinemet to 1 pill 4 times a day at 7, 11, 3 PM and 7 PM. He was also encouraged to take the CR at night.     I saw him on 01/13/16, at which time he reported feeling fairly  stable. He did not continue with the CR at night as he did not notice much in the way of difference. He denied any recent restless leg symptoms. He did feel Mirapex was helpful for that. He was taking Sinemet 3 times a day and Mirapex 3 times a day, Azilect 1 mg once daily, which was generic. He reported fall about a week prior to his last appointment. He scraped his knee.he does not always drink enough water. He had some right-sided sciatica symptoms and radiating pain to the back of his thigh.  I ordered an MRI L spine wo contrast, which he had on 01/22/16:  IMPRESSION:  This MRI of the lumbar spine without contrast shows the following: 1.    At L3-L4, there is a midline disc protrusion and mild facet hypertrophy combining to cause borderline spinal stenosis and moderate left lateral recess stenosis there does not appear to be nerve root compression though there is some encroachment upon the traversing left L4 nerve root. 2.   There are milder degenerative changes at L2-L3, L4-L5 and L5-S1 with less potential for nerve root impingement. 3.   There are no acute findings.   We called him with the test results.    I saw him on 09/09/2015, at which time he reported that the addition of Azilect was helpful. I suggested he continue with Sinemet tid, Mirapex 1 mg tid, Azilect once daily and try C/L CR at night.   I saw him on 04/24/2015, at which time the patient reported worsening balance and increase in his tremors. Unfortunately, he had fallen recently and fell backwards as he stood up from a chair and fell onto his back after which she felt sore for a couple of days but thankfully did not injure himself seriously. He did not have any head injury or loss of consciousness. He was working 2 jobs. He was not drinking enough water. He was not sleeping as well at night. Memory was stable. He denied significant depressive symptoms. His restless legs symptoms were under control. I suggested she continue with Sinemet  and Mirapex, but I asked him to restart Azilect which he had tried in the past. We talked about potentially pursuing DBS surgery in the future.   I first met him on 12/25/2014, at which time he reported improved parkinsonian symptoms with Mirapex and Sinemet. I asked him to continue his medications, Mirapex 1 mg strength one pill 3 times a day and Sinemet 25-100 milligrams strength one pill 3 times a day. I suggested he could add a half pill of Sinemet as needed in the evenings.    He previously followed with Dr. Jim Like was last seen by him on 07/17/2014, at which time the patient reported having stopped Sinemet and he was using it as needed only. His Mirapex was increased to 1 mg 3 times a day as he also  reported residual restless leg symptoms. The addition of Azilect was discussed.  He talked about DBS for future consideration.   Prior to that he was followed by Dr. Morene Antu and was last seen by Dr. Erling Cruz on 11/25/2011. He was originally seen by Dr. Erling Cruz in November 2009 at which time he presented with right-sided stiffness and fine motor dyscontrol. He has a positive family history of Parkinson's disease in his father. He was initially started on Azilect and Mirapex was added in 2011. He started Sinemet 25-100 milligrams strength one pill 3 times a day and eventually stop the Azilect, due to side effects reported, including cognitive SEs.   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 He is a non-smoker, he drinks wine usually on weekends.    He noted a R hand tremor in the last 6 months. He has work related stress. He sleeps well generally speaking. Confined spaces are more difficulty to negotiate. He has mild memory issues. He has not fallen. He did not do well without the Sinemet and has since then restarted it. He takes it 3 times a day along with one pill of Mirapex. He has not taken his midday dose yet. He has no significant side effects except for mild  sleepiness. He has never fallen asleep while driving. He has mild swelling in his feet at times especially on longer plane rides. He has to travel internationally maybe 3 times a year. His medication dose does last for 4 and sometimes 5 hours but at the end of the day especially if he's had a long work day he feels fatigued and more off. He goes to bed late. It is usually between midnight and 1 AM. He has a rise time of 7:30 AM. He snores when he sleeps on his back but does not have any gasping sensations are witnessed apneas as he recalls. His RLS symptoms are under control.     His Past Medical History Is Significant For: Past Medical History:  Diagnosis Date  . Arthritis   . Dyskinesia    occasionally   . Hx of colonic polyps 12/10/2010  . Leg pain left  . Parkinson's disease ([place])     His Past Surgical History Is Significant For: Past Surgical History:  Procedure Laterality Date  . COLONOSCOPY    . KNEE ARTHROSCOPY Right 01/2017   with Dr Alvan Dame ;at surgical center   . None listed    . TOTAL KNEE ARTHROPLASTY Right 09/28/2017   Procedure: RIGHT TOTAL KNEE ARTHROPLASTY;  Surgeon: Paralee Cancel, MD;  Location: [facility];  Service: Orthopedics;  Laterality: Right;  70 mins  . TOTAL KNEE ARTHROPLASTY Left 04/12/2018   Procedure: LEFT TOTAL KNEE ARTHROPLASTY;  Surgeon: Paralee Cancel, MD;  Location: [facility];  Service: Orthopedics;  Laterality: Left;  Adductor Block    His Family History Is Significant For: Family History  Problem Relation Age of Onset  . Heart disease Mother   . Diabetes Father   . Parkinson's disease Father   . Colon cancer Neg Hx   . Esophageal cancer Neg Hx   . Rectal cancer Neg Hx   . Stomach cancer Neg Hx     His Social History Is Significant For: Social History   Socioeconomic History  . Marital status: Married    Spouse name: Lanelle Bal  . Number of children: 3  . Years of education: Ph.D  . Highest education level: Not on file  Occupational History     Employer: [employer]  Social Needs  . Financial resource strain: Not on file  . Food insecurity:    Worry: Not on file    Inability: Not on file  . Transportation needs:    Medical: Not on file    Non-medical: Not on file  Tobacco Use  . Smoking status: Never Smoker  . Smokeless tobacco: Never Used  Substance and Sexual Activity  . Alcohol use: Yes    Alcohol/week: 5.0 standard drinks    Types: 5 Glasses of wine per week    Comment: 2 bottles of wine on week-end.  . Drug use: No  . Sexual activity: Not on file  Lifestyle  . Physical activity:    Days per week: Not on file    Minutes per session: Not on file  . Stress: Not on file  Relationships  . Social connections:    Talks on phone: Not on file    Gets together: Not on file    Attends religious service: Not on file    Active member of club or organization: Not on file    Attends meetings of clubs or organizations: Not on file    Relationship status: Not on file  Other Topics Concern  . Not on file  Social History Narrative   Patient is right handed   Patient lives at home with his wife Lanelle Bal. 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   Caffeine- coffee on occasion    Patient has a Ph.D.          His Allergies Are:  No Known Allergies:   His Current Medications Are:  Outpatient Encounter Medications as of 07/20/2018  Medication Sig  . carbidopa-levodopa (SINEMET CR) 50-200 MG tablet Take 1 tablet by mouth at bedtime.  . carbidopa-levodopa (SINEMET IR) 25-100 MG tablet Take 1 tablet by mouth 5 (five) times daily. Take at 7 AM, 10 AM, 1 PM, 4 PM and 7 PM  . Cephalexin (KEFLEX PO) Take by mouth.  Marland Kitchen HYDROcodone-acetaminophen (NORCO) 7.5-325 MG tablet Take 1-2 tablets by mouth every 4 (four) hours as needed for moderate pain.  . pramipexole (MIRAPEX) 1 MG tablet Take 1 tablet (1 mg total) by mouth 3 (three) times daily. Taking at 8am, 12pm, and 5 pm  .  rasagiline (AZILECT) 1 MG TABS tablet Take 1 tablet (1 mg total) by mouth daily.  . valACYclovir (VALTREX) 1000 MG tablet Take 1,000 mg by mouth as needed (cold sores).   . [DISCONTINUED] docusate sodium (COLACE) 100 MG capsule Take 1 capsule (100 mg total) by mouth 2 (two) times daily.  . [DISCONTINUED] ferrous sulfate (FERROUSUL) 325 (65 FE) MG tablet Take 1 tablet (325 mg total) by mouth 3 (three) times daily with meals.  . [DISCONTINUED] methocarbamol (ROBAXIN) 500 MG tablet Take 1 tablet (500 mg total) by mouth every 6 (six) hours as needed for muscle spasms.  . [DISCONTINUED] polyethylene glycol (MIRALAX / GLYCOLAX) packet Take 17 g by mouth 2 (two) times daily.   No facility-administered encounter medications on file as of 07/20/2018.   :  Review of Systems:  Out of a complete 14 point review of systems, all are reviewed and negative with the exception of these symptoms as listed below: Review of Systems  Neurological:       Pt presents today to discuss his PD. Pt had DBS part 1 surgery on 07/14/18. His next  surgery is 08/22/18. Pt is doing well post operatively.    Objective:  Neurological Exam  Physical Exam Physical Examination:   Vitals:   07/20/18 1133  BP: 124/80  Pulse: 69   General Examination: The patient is a very pleasant 57 y.o. male in no acute distress. He appears well-developed and well-nourished and well groomed.   HEENT:Normocephalic, atraumatic, pupils are equal, round and reactive to light and accommodation. Wears corrective eyeglasses. Extraocular tracking shows mild to moderate saccadic breakdown, no nystagmus, he has mild limitation to vertical gaze. He has a decreased eye blink rate and mild to moderate facial masking, moderatehypophonia, no dysarthria, oropharynx examination reveals moderate mouth dryness, tongue protrudes centrally and palate elevates symmetrically. There is no drooling, no nasal drainage. No facial dyskinesias are noted. Well-healing scar  from left DBS electrode placement, staples in place.  Chest:Clear to auscultation without wheezing, rhonchi or crackles noted.  Heart:S1+S2+0, regular and normal without murmurs, rubs or gallops noted.   Abdomen:Soft, non-tender and non-distended with normal bowel sounds appreciated on auscultation.  Extremities:There is some edema in the distal lower extremities bilaterally.  Skin: Warm and dry without trophic changes noted.  Musculoskeletal: exam revealsright knee is wider than left, has some left knee discomfort.   Neurologically:  Mental status: The patient is awake, alert and oriented in all 4 spheres. Hisimmediate and remote memory, attention, language skills and fund of knowledge are appropriate. There is no evidence of aphasia, agnosia, apraxia or anomia. Speech is clear with normal prosody and enunciation. Thought process is linear. Mood is normaland affect is normal.  Cranial nerves II - XII are as described above under HEENT exam.  Motor exam: Normal bulk, and strength are noted, he has fairly good range of motion in the right knee, he has no back pain. He has increase in tone in right more than left upper and lower extremities, mild and intermittent generalized dyskinesias, only intermittent resting tremor noted in the right upper extremity. Fine motor skills are moderately impaired on the right and better on the left. He stands up with mild difficulty, posture is mildly stooped, he walks with a slight limp on the left. Balance is mildly impaired. Sensory exam stable to light touch, no cerebellar signs, no dysmetria. No gait ataxia. Reflexes are 2+.  Assessment and plan:   In summary, Antonio [name] a very pleasant 57 year old malewithan underlying benign medical history who presents for follow-up consultation of his right-sided predominant Parkinson's disease, diagnosis dating back to 2009 and symptoms dating back to about 2008. He remained stable for several  years, was doing well on a combination of Sinemet one pill 3 times a day as well as Mirapex 3 times a day. He was off of Azilect for a while and restarted it successfully.We then started the CR at night.he has had right total knee replacement in 2018 and left total knee replacement this year. He is now status post left subthalamic nucleus DBS electrode placement with generator placement planned for late September and follow-up with Dr. Linus Mako in October. We mutually agreed to keep his medication regimen stable at this time, I suggest that I see him back routinely in 6 months, sooner if needed. I answered all his questions today and he was in agreement. I spent 20 minutes in total face-to-face time with the patient, more than 50% of which was spent in counseling and coordination of care, reviewing test results, reviewing medication and discussing or reviewing the diagnosis of PD, its prognosis and  treatment options. Pertinent laboratory and imaging test results that were available during this visit with the patient were reviewed by me and considered in my medical decision making (see chart for details).

## 2018-07-20 NOTE — Patient Instructions (Signed)
We will keep your meds the same. You look well. I am glad you did well after your knee replacement surgery and the DBS electrode placement. Good luck with your surgery next month! I will see you back in 6 months.

## 2019-01-23 ENCOUNTER — Ambulatory Visit: Payer: BLUE CROSS/BLUE SHIELD | Admitting: Neurology

## 2019-01-23 ENCOUNTER — Encounter: Payer: Self-pay | Admitting: Neurology

## 2019-01-23 ENCOUNTER — Other Ambulatory Visit: Payer: Self-pay

## 2019-01-23 VITALS — BP 118/72 | HR 66 | Resp 98 | Ht 74.0 in | Wt 268.0 lb

## 2019-01-23 DIAGNOSIS — G20 Parkinson's disease: Secondary | ICD-10-CM | POA: Diagnosis not present

## 2019-01-23 NOTE — Patient Instructions (Addendum)
We could try to reduce your pramipexole to see if your swelling in the legs goes down: you could take 1 mg: 1/2 pill in AM and midday and 1 whole pill at night (since it still helps with your restless legs). Please also talk to Dr. Rubin Payor about it since you have an appt coming up in about 2 weeks.  I am very glad to see you have done so well after the DBS.  Keep up the good work with your exercise. Drink plenty of water, be proactive about constipation. Follow up in 6 months.  Let me know, if you need prescription refills.

## 2019-01-23 NOTE — Progress Notes (Signed)
Subjective:    Patient ID: Antonio [name] is a 58 y.o. male.  HPI     Interim history:   Antonio [name] is a very pleasant 58 year-old right-handed gentleman with an underlying benign medical history, who presents for follow-up consultation of his right-sided predominant Parkinson's disease and his restless leg syndrome. The patient is unaccompanied today. I last saw him on 07/20/2018, at which time he was recently status post left subthalamic DBS electrode placement on 07/14/2018, with generator placement pending for September 2019, on 08/22/2018. 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 He had prior right total knee replacement. I suggested we keep his PD meds stable.  Today, 01/23/2019 (all dictated new, as well as above notes, some dictation done in note pad or Word, outside of chart, may appear as copied):    He reports doing rather well. His last programming appointment in December 2019 went very well, the first programming appointment did not result in any telltale changes. He is exercising, uses his stationary bike now. He has had some residual knee discomfort. He takes Advil at night as needed. He has reduced his Mirapex from time to time by skipping a dose. He does usually take his evening dose because it helps his RLS. Today, he did not take any additional medication other than his morning doses. He has had some   The patient's allergies, current medications, family history, past medical history, past social history, past surgical history and problem list were reviewed and updated as appropriate.    Previously (copied from previous notes for reference):    I saw him on 01/20/2018, at which time he was fairly stable. He had stopped taking Lexapro. He had undergone right total knee replacement surgery recently and was looking at left total knee replacement. He had DBS consultation at [facility] and was supposed to see a neurosurgeon next. He had a recent fall. Thankfully he  did not injure himself. I suggested we continue his medication regimen.        I saw him on 09/22/2017, at which time he reported a fall. His right knee was bothering him. He was scheduled for right total knee replacement on 09/28/2017. He was also scheduled for a DBS consultation at [facility].   I saw him on 06/22/2017, at which time he reported more off time. He felt he was more progressed in his symptoms. His wife reported that he had fallen several times. He had more recent stressors. His wife had noted more freezing. He was more irritable and frustrated easier. He had more issues with his right knee, most likely needing total knee replacement. I suggested we increase his Sinemet to 1 pill 5 times a day and keep his Mirapex 1 mg 3 times a day, Sinemet CR bedtime, and keep his Azilect the same. I suggested an opinion for DBS evaluation at [facility]. I also suggested he start taking Lexapro 5 mg strength.    I saw him on 02/16/2017, at which time he was on Sinemet 4 times a day and Sinemet CR at bedtime. He had to have interim right knee surgery after an injury. He was still on hydrocodone as needed for pain. 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 Lower extremity swelling was about the same, memory stable, no recent falls. No side effects from his medication. He was off of Azilect for his surgery and was able to restarted without problems after about 2 weeks. I  asked him to continue with Mirapex 3 times a day, Sinemet CR bedtime, Sinemet IR 4 times a day and Azilect once daily.   I saw him on 05/12/2016, at which time he was reporting right leg pain and thigh pain. This seemed to be worse in between Sinemet doses. He was on Sinemet 3 times a day but was not always keeping a set schedule for it. He was also taking the CR Sinemet at night more consistently. I suggested low-dose baclofen as needed 10 mg strength for his right leg muscle tension and pain. I suggested we continue  with Azilect and Mirapex at the current doses but increase his Sinemet to 1 pill 4 times a day at 7, 11, 3 PM and 7 PM. He was also encouraged to take the CR at night.     I saw him on 01/13/16, at which time he reported feeling fairly stable. He did not continue with the CR at night as he did not notice much in the way of difference. He denied any recent restless leg symptoms. He did feel Mirapex was helpful for that. He was taking Sinemet 3 times a day and Mirapex 3 times a day, Azilect 1 mg once daily, which was generic. He reported fall about a week prior to his last appointment. He scraped his knee.he does not always drink enough water. He had some right-sided sciatica symptoms and radiating pain to the back of his thigh.  I ordered an MRI L spine wo contrast, which he had on 01/22/16:  IMPRESSION:  This MRI of the lumbar spine without contrast shows the following: 1.    At L3-L4, there is a midline disc protrusion and mild facet hypertrophy combining to cause borderline spinal stenosis and moderate left lateral recess stenosis there does not appear to be nerve root compression though there is some encroachment upon the traversing left L4 nerve root. 2.   There are milder degenerative changes at L2-L3, L4-L5 and L5-S1 with less potential for nerve root impingement. 3.   There are no acute findings.   We called him with the test results.    I saw him on 09/09/2015, at which time he reported that the addition of Azilect was helpful. I suggested he continue with Sinemet tid, Mirapex 1 mg tid, Azilect once daily and try C/L CR at night.   I saw him on 04/24/2015, at which time the patient reported worsening balance and increase in his tremors. Unfortunately, he had fallen recently and fell backwards as he stood up from a chair and fell onto his back after which she felt sore for a couple of days but thankfully did not injure himself seriously. He did not have any head injury or loss of consciousness. He  was working 2 jobs. He was not drinking enough water. He was not sleeping as well at night. Memory was stable. He denied significant depressive symptoms. His restless legs symptoms were under control. I suggested she continue with Sinemet and Mirapex, but I asked him to restart Azilect which he had tried in the past. We talked about potentially pursuing DBS surgery in the future.   I first met him on 12/25/2014, at which time he reported improved parkinsonian symptoms with Mirapex and Sinemet. I asked him to continue his medications, Mirapex 1 mg strength one pill 3 times a day and Sinemet 25-100 milligrams strength one pill 3 times a day. I suggested he could add a half pill of Sinemet as needed in the  evenings.    He previously followed with Dr. Jim Like was last seen by him on 07/17/2014, at which time the patient reported having stopped Sinemet and he was using it as needed only. His Mirapex was increased to 1 mg 3 times a day as he also reported residual restless leg symptoms. The addition of Azilect was discussed.  He talked about DBS for future consideration.   Prior to that he was followed by Dr. Morene Antu and was last seen by Dr. Erling Cruz on 11/25/2011. He was originally seen by Dr. Erling Cruz in November 2009 at which time he presented with right-sided stiffness and fine motor dyscontrol. He has a positive family history of Parkinson's disease in his father. He was initially started on Azilect and Mirapex was added in 2011. He started Sinemet 25-100 milligrams strength one pill 3 times a day and eventually stop the Azilect, due to side effects reported, including cognitive SEs.   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 He is a non-smoker, he drinks wine usually on weekends.    He noted a R hand tremor in the last 6 months. He has work related stress. He sleeps well generally speaking. Confined spaces are more difficulty to negotiate. He has mild memory  issues. He has not fallen. He did not do well without the Sinemet and has since then restarted it. He takes it 3 times a day along with one pill of Mirapex. He has not taken his midday dose yet. He has no significant side effects except for mild sleepiness. He has never fallen asleep while driving. He has mild swelling in his feet at times especially on longer plane rides. He has to travel internationally maybe 3 times a year. His medication dose does last for 4 and sometimes 5 hours but at the end of the day especially if he's had a long work day he feels fatigued and more off. He goes to bed late. It is usually between midnight and 1 AM. He has a rise time of 7:30 AM. He snores when he sleeps on his back but does not have any gasping sensations are witnessed apneas as he recalls. His RLS symptoms are under control.    His Past Medical History Is Significant For: Past Medical History:  Diagnosis Date  . Arthritis   . Dyskinesia    occasionally   . Hx of colonic polyps 12/10/2010  . Leg pain left  . Parkinson's disease ([place])     His Past Surgical History Is Significant For: Past Surgical History:  Procedure Laterality Date  . COLONOSCOPY    . KNEE ARTHROSCOPY Right 01/2017   with Dr Alvan Dame ;at surgical center   . None listed    . TOTAL KNEE ARTHROPLASTY Right 09/28/2017   Procedure: RIGHT TOTAL KNEE ARTHROPLASTY;  Surgeon: Paralee Cancel, MD;  Location: [facility];  Service: Orthopedics;  Laterality: Right;  70 mins  . TOTAL KNEE ARTHROPLASTY Left 04/12/2018   Procedure: LEFT TOTAL KNEE ARTHROPLASTY;  Surgeon: Paralee Cancel, MD;  Location: [facility];  Service: Orthopedics;  Laterality: Left;  Adductor Block    His Family History Is Significant For: Family History  Problem Relation Age of Onset  . Heart disease Mother   . Diabetes Father   . Parkinson's disease Father   . Colon cancer Neg Hx   . Esophageal cancer Neg Hx   . Rectal cancer Neg Hx   . Stomach cancer Neg Hx  His Social History  Is Significant For: Social History   Socioeconomic History  . Marital status: Married    Spouse name: Lanelle Bal  . Number of children: 3  . Years of education: Ph.D  . Highest education level: Not on file  Occupational History    Employer: [employer]  . Financial resource strain: Not on file  . Food insecurity:    Worry: Not on file    Inability: Not on file  . Transportation needs:    Medical: Not on file    Non-medical: Not on file  Tobacco Use  . Smoking status: Never Smoker  . Smokeless tobacco: Never Used  Substance and Sexual Activity  . Alcohol use: Yes    Alcohol/week: 5.0 standard drinks    Types: 5 Glasses of wine per week    Comment: 2 bottles of wine on week-end.  . Drug use: No  . Sexual activity: Not on file  Lifestyle  . Physical activity:    Days per week: Not on file    Minutes per session: Not on file  . Stress: Not on file  Relationships  . Social connections:    Talks on phone: Not on file    Gets together: Not on file    Attends religious service: Not on file    Active member of club or organization: Not on file    Attends meetings of clubs or organizations: Not on file    Relationship status: Not on file  Other Topics Concern  . Not on file  Social History Narrative   Patient is right handed   Patient lives at home with his wife Lanelle Bal. 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   Caffeine- coffee on occasion    Patient has a Ph.D.          His Allergies Are:  No Known Allergies:   His Current Medications Are:  Outpatient Encounter Medications as of 01/23/2019  Medication Sig  . carbidopa-levodopa (SINEMET CR) 50-200 MG tablet Take 1 tablet by mouth at bedtime.  . carbidopa-levodopa (SINEMET IR) 25-100 MG tablet Take 1 tablet by mouth 5 (five) times daily. Take at 7 AM, 10 AM, 1 PM, 4 PM and 7 PM  . Cephalexin (KEFLEX PO) Take by mouth.  . pramipexole (MIRAPEX) 1 MG  tablet Take 1 tablet (1 mg total) by mouth 3 (three) times daily. Taking at 8am, 12pm, and 5 pm  . rasagiline (AZILECT) 1 MG TABS tablet Take 1 tablet (1 mg total) by mouth daily.  . valACYclovir (VALTREX) 1000 MG tablet Take 1,000 mg by mouth as needed (cold sores).   . [DISCONTINUED] HYDROcodone-acetaminophen (NORCO) 7.5-325 MG tablet Take 1-2 tablets by mouth every 4 (four) hours as needed for moderate pain.   No facility-administered encounter medications on file as of 01/23/2019.   :  Review of Systems:  Out of a complete 14 point review of systems, all are reviewed and negative with the exception of these symptoms as listed below:  Review of Systems  Neurological:       Pt presents today to discuss his PD. Pt denies any new problems.    Objective:  Neurological Exam  Physical Exam Physical Examination:   Vitals:   01/23/19 1511  BP: 118/72  Pulse: 66  Resp: (!) 98    General Examination: The patient is a very pleasant 58 y.o. male in no acute distress. He  appears well-developed and well-nourished and well groomed.   HEENT:Normocephalic, atraumatic, pupils are equal, round and reactive to light and accommodation. Wears corrective eyeglasses. Extraocular tracking shows mild saccadic breakdown, no obvious nystagmus. He has a decreased eye blink rate and mild facial masking, mild hypophonia, no dysarthria, oropharynx examination reveals mild mouth dryness, tongue protrudes centrally and palate elevates symmetrically. There is no drooling, no nasal drainage.No facial dyskinesias are noted. Well-healing scar from left DBS. Palpable electrode in the left neck area. Well-healed area left scalp.  Chest:Clear to auscultation without wheezing, rhonchi or crackles noted.  Heart:S1+S2+0, regular and normal without murmurs, rubs or gallops noted.   Abdomen:Soft, non-tender and non-distended.  Extremities:There is1-2+ edema in the distal lower extremities bilaterally. Mild  erythema.  Skin: Warm and dry without trophic changes noted.  Musculoskeletal: exam revealsmild bilateral knee swelling, right a little worse than left. Mild residual discomfort in both knees.  Neurologically:  Mental status: The patient is awake, alert and oriented in all 4 spheres. Hisimmediate and remote memory, attention, language skills and fund of knowledge are appropriate. There is no evidence of aphasia, agnosia, apraxia or anomia. Speech is clear with normal prosody and enunciation. Thought process is linear. Mood is normaland affect is normal.  Cranial nerves II - XII are as described above under HEENT exam.  Motor exam: Normal bulk, and strength are noted, he hasfairly good range of motion, no resting tremor. No obvious dyskinesias. Fine motor skills are mildly impaired bilaterally. He stands up without difficulty and posture is fairly good today. He walks with a slight limp on the right. Balance is preserved. He works with that her arm swing on the right but slight dystonic posturing of the right arm. Sensory exam stable to light touch, no cerebellar signs, no dysmetria. No gait ataxia.  Assessment and plan:   In summary, Antonio [name] a very pleasant 58 year old malewithan underlying medical history of arthritis with status post knee replacement surgeries, who presents for follow-up consultation of his right-sided predominant Parkinson's disease with diagnosis dating back to 2009 and symptoms dating back to about 2008. He is status post DBS placement last year with generator placement in September and electrode placement in August 2019. He has had his history some programming appointment in December 2019 and has done rather well with it. His lower extremity swelling is worse. He continues on the same medication regimen including Sinemet 1 pill 5 times a day, Mirapex 1 mg strength one pill 3 times a day, Azilect 1 mg once daily and Sinemet CR at bedtime. I would like to see  about reducing his pramipexole to see if his leg swelling improves. He could reduce it to half pill in the morning and midday for now and keep a whole pill at night because it helps his RLS symptoms. He is encouraged to discuss this also with Dr. Linus Mako since he has an appointment coming up in the next couple weeks.  He was off of Azilect for a while and restarted it successfully.We then started the CR at night.He had right total knee replacement in 2018 and left total knee replacement in 2019. I suggest a 6 month FU, sooner if needed. I answered all his questions today and he was in agreement.

## 2019-07-26 ENCOUNTER — Ambulatory Visit: Payer: BLUE CROSS/BLUE SHIELD | Admitting: Neurology

## 2019-07-26 ENCOUNTER — Encounter: Payer: Self-pay | Admitting: Neurology

## 2019-07-26 ENCOUNTER — Other Ambulatory Visit: Payer: Self-pay

## 2019-07-26 VITALS — BP 137/81 | HR 57 | Ht 74.0 in | Wt 271.0 lb

## 2019-07-26 DIAGNOSIS — R252 Cramp and spasm: Secondary | ICD-10-CM

## 2019-07-26 DIAGNOSIS — R0683 Snoring: Secondary | ICD-10-CM

## 2019-07-26 DIAGNOSIS — R351 Nocturia: Secondary | ICD-10-CM | POA: Diagnosis not present

## 2019-07-26 DIAGNOSIS — E669 Obesity, unspecified: Secondary | ICD-10-CM

## 2019-07-26 DIAGNOSIS — G479 Sleep disorder, unspecified: Secondary | ICD-10-CM

## 2019-07-26 DIAGNOSIS — G4719 Other hypersomnia: Secondary | ICD-10-CM

## 2019-07-26 DIAGNOSIS — G20 Parkinson's disease: Secondary | ICD-10-CM | POA: Diagnosis not present

## 2019-07-26 MED ORDER — RASAGILINE MESYLATE 1 MG PO TABS: 1.0000 mg | ORAL_TABLET | Freq: Every day | ORAL | 3 refills | Status: DC

## 2019-07-26 MED ORDER — PRAMIPEXOLE DIHYDROCHLORIDE 1 MG PO TABS: 1.0000 mg | ORAL_TABLET | Freq: Every day | ORAL | 3 refills | Status: DC

## 2019-07-26 MED ORDER — BACLOFEN 10 MG PO TABS: 10.0000 mg | ORAL_TABLET | Freq: Every evening | ORAL | 3 refills | Status: DC | PRN

## 2019-07-26 MED ORDER — CARBIDOPA-LEVODOPA 25-100 MG PO TABS: 1.0000 | ORAL_TABLET | Freq: Every day | ORAL | 3 refills | Status: DC

## 2019-07-26 NOTE — Progress Notes (Signed)
Epworth Sleepiness Scale 0= would never doze 1= slight chance of dozing 2= moderate chance of dozing 3= high chance of dozing  Sitting and reading: 3 Watching TV: 2 Sitting inactive in a public place (ex. Theater or meeting): 3 As a passenger in a car for an hour without a break: 3 Lying down to rest in the afternoon: 3 Sitting and talking to someone: 1 Sitting quietly after lunch (no alcohol): 2 In a car, while stopped in traffic: 2 Total: 19  FSS: 34

## 2019-07-26 NOTE — Progress Notes (Signed)
Subjective:    Patient ID: Antonio [name] is a 58 y.o. male.  HPI     Interim history:   Antonio [name] is a very pleasant 58 year-old right-handed gentleman with an underlying benign medical history, who presents for follow-up consultation of his right-sided predominant Parkinson's disease and his restless leg syndrome. The patient is unaccompanied today. I last saw him on 01/23/2019, at which time he reported doing well.  He had a good programming appointment in 2019 in December which helped him quite a bit.  He was active physically.  He had some residual knee discomfort.  He was taking Advil as needed for this.  He had reduced his Mirapex on his own by skipping a dose here and there.  He was taking his nighttime dose consistently because of his restless legs.  Since he had some lower extremity swelling I suggested he reduce his Mirapex to half a pill in the morning and midday and 1 pill at night  Today, 07/26/2019: He reports some progression of his symptoms, he feels that his stiffness has become worse.  He does have an appointment with Elbert Ewings at [facility] for follow-up post DBS next month. He is hoping that additional programming may help his symptoms.  He has had leg cramping at night.  He ran out of his prescription for baclofen which was somewhat helpful.  Sometimes his sleep is interrupted because of involuntary tightening of both legs.  He has noticed more tremor on the left side.  He is wondering if he should consider his second DBS sooner rather than later, perhaps early next year.  He will bring this up at next [facility] appointment with [facility].  He had reduced his Mirapex to 1 pill once daily to see if his swelling improved, since it did not improve he went back up to 1 pill twice daily.  He had also reduced his Sinemet to 1 pill 4 times a day.  He has not fallen.  He has been working from home.  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 Sleep is not very good, he snores.  He does not  sleep in the same bedroom as his wife.  Nocturia is about 2-3 times per average night.  He denies morning headaches, restless leg symptoms are generally well controlled.  Bedtime is around midnight and rise time between 6 and 8. Epworth sleepiness score is 19 out of 24 today, fatigue severity score 34 out of 63.   The patient's allergies, current medications, family history, past medical history, past social history, past surgical history and problem list were reviewed and updated as appropriate.    Previously (copied from previous notes for reference):    I saw him on 07/20/2018, at which time he was recently status post left subthalamic DBS electrode placement on 07/14/2018, with generator placement pending for September 2019, on 08/22/2018. 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 He had prior right total knee replacement. I suggested we keep his PD meds stable.      I saw him on 01/20/2018, at which time he was fairly stable. He had stopped taking Lexapro. He had undergone right total knee replacement surgery recently and was looking at left total knee replacement. He had DBS consultation at [facility] and was supposed to see a neurosurgeon next. He had a recent fall. Thankfully he did not injure himself. I suggested we continue his medication regimen.        I saw  him on 09/22/2017, at which time he reported a fall. His right knee was bothering him. He was scheduled for right total knee replacement on 09/28/2017. He was also scheduled for a DBS consultation at [facility].   I saw him on 06/22/2017, at which time he reported more off time. He felt he was more progressed in his symptoms. His wife reported that he had fallen several times. He had more recent stressors. His wife had noted more freezing. He was more irritable and frustrated easier. He had more issues with his right knee, most likely needing total knee replacement. I suggested we increase his Sinemet to 1 pill 5  times a day and keep his Mirapex 1 mg 3 times a day, Sinemet CR bedtime, and keep his Azilect the same. I suggested an opinion for DBS evaluation at [facility]. I also suggested he start taking Lexapro 5 mg strength.    I saw him on 02/16/2017, at which time he was on Sinemet 4 times a day and Sinemet CR at bedtime. He had to have interim right knee surgery after an injury. He was still on hydrocodone as needed for pain. 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 Lower extremity swelling was about the same, memory stable, no recent falls. No side effects from his medication. He was off of Azilect for his surgery and was able to restarted without problems after about 2 weeks. I asked him to continue with Mirapex 3 times a day, Sinemet CR bedtime, Sinemet IR 4 times a day and Azilect once daily.   I saw him on 05/12/2016, at which time he was reporting right leg pain and thigh pain. This seemed to be worse in between Sinemet doses. He was on Sinemet 3 times a day but was not always keeping a set schedule for it. He was also taking the CR Sinemet at night more consistently. I suggested low-dose baclofen as needed 10 mg strength for his right leg muscle tension and pain. I suggested we continue with Azilect and Mirapex at the current doses but increase his Sinemet to 1 pill 4 times a day at 7, 11, 3 PM and 7 PM. He was also encouraged to take the CR at night.     I saw him on 01/13/16, at which time he reported feeling fairly stable. He did not continue with the CR at night as he did not notice much in the way of difference. He denied any recent restless leg symptoms. He did feel Mirapex was helpful for that. He was taking Sinemet 3 times a day and Mirapex 3 times a day, Azilect 1 mg once daily, which was generic. He reported fall about a week prior to his last appointment. He scraped his knee.he does not always drink enough water. He had some right-sided sciatica symptoms and radiating pain  to the back of his thigh.  I ordered an MRI L spine wo contrast, which he had on 01/22/16:  IMPRESSION:  This MRI of the lumbar spine without contrast shows the following: 1.    At L3-L4, there is a midline disc protrusion and mild facet hypertrophy combining to cause borderline spinal stenosis and moderate left lateral recess stenosis there does not appear to be nerve root compression though there is some encroachment upon the traversing left L4 nerve root. 2.   There are milder degenerative changes at L2-L3, L4-L5 and L5-S1 with less potential for nerve root impingement. 3.   There are  no acute findings.   We called him with the test results.    I saw him on 09/09/2015, at which time he reported that the addition of Azilect was helpful. I suggested he continue with Sinemet tid, Mirapex 1 mg tid, Azilect once daily and try C/L CR at night.   I saw him on 04/24/2015, at which time the patient reported worsening balance and increase in his tremors. Unfortunately, he had fallen recently and fell backwards as he stood up from a chair and fell onto his back after which she felt sore for a couple of days but thankfully did not injure himself seriously. He did not have any head injury or loss of consciousness. He was working 2 jobs. He was not drinking enough water. He was not sleeping as well at night. Memory was stable. He denied significant depressive symptoms. His restless legs symptoms were under control. I suggested she continue with Sinemet and Mirapex, but I asked him to restart Azilect which he had tried in the past. We talked about potentially pursuing DBS surgery in the future.   I first met him on 12/25/2014, at which time he reported improved parkinsonian symptoms with Mirapex and Sinemet. I asked him to continue his medications, Mirapex 1 mg strength one pill 3 times a day and Sinemet 25-100 milligrams strength one pill 3 times a day. I suggested he could add a half pill of Sinemet as needed in  the evenings.    He previously followed with Dr. Jim Like was last seen by him on 07/17/2014, at which time the patient reported having stopped Sinemet and he was using it as needed only. His Mirapex was increased to 1 mg 3 times a day as he also reported residual restless leg symptoms. The addition of Azilect was discussed.  He talked about DBS for future consideration.   Prior to that he was followed by Dr. Morene Antu and was last seen by Dr. Erling Cruz on 11/25/2011. He was originally seen by Dr. Erling Cruz in November 2009 at which time he presented with right-sided stiffness and fine motor dyscontrol. He has a positive family history of Parkinson's disease in his father. He was initially started on Azilect and Mirapex was added in 2011. He started Sinemet 25-100 milligrams strength one pill 3 times a day and eventually stop the Azilect, due to side effects reported, including cognitive SEs.   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 He is a non-smoker, he drinks wine usually on weekends.    He noted a R hand tremor in the last 6 months. He has work related stress. He sleeps well generally speaking. Confined spaces are more difficulty to negotiate. He has mild memory issues. He has not fallen. He did not do well without the Sinemet and has since then restarted it. He takes it 3 times a day along with one pill of Mirapex. He has not taken his midday dose yet. He has no significant side effects except for mild sleepiness. He has never fallen asleep while driving. He has mild swelling in his feet at times especially on longer plane rides. He has to travel internationally maybe 3 times a year. His medication dose does last for 4 and sometimes 5 hours but at the end of the day especially if he's had a long work day he feels fatigued and more off. He goes to bed late. It is usually between midnight and 1 AM. He  has a rise time of 7:30 AM. He snores when he sleeps on his back  but does not have any gasping sensations are witnessed apneas as he recalls. His RLS symptoms are under control.    His Past Medical History Is Significant For: Past Medical History:  Diagnosis Date  . Arthritis   . Dyskinesia    occasionally   . Hx of colonic polyps 12/10/2010  . Leg pain left  . Parkinson's disease ([place])     His Past Surgical History Is Significant For: Past Surgical History:  Procedure Laterality Date  . COLONOSCOPY    . KNEE ARTHROSCOPY Right 01/2017   with Dr Alvan Dame ;at surgical center   . None listed    . TOTAL KNEE ARTHROPLASTY Right 09/28/2017   Procedure: RIGHT TOTAL KNEE ARTHROPLASTY;  Surgeon: Paralee Cancel, MD;  Location: [facility];  Service: Orthopedics;  Laterality: Right;  70 mins  . TOTAL KNEE ARTHROPLASTY Left 04/12/2018   Procedure: LEFT TOTAL KNEE ARTHROPLASTY;  Surgeon: Paralee Cancel, MD;  Location: [facility];  Service: Orthopedics;  Laterality: Left;  Adductor Block    His Family History Is Significant For: Family History  Problem Relation Age of Onset  . Heart disease Mother   . Diabetes Father   . Parkinson's disease Father   . Colon cancer Neg Hx   . Esophageal cancer Neg Hx   . Rectal cancer Neg Hx   . Stomach cancer Neg Hx     His Social History Is Significant For: Social History   Socioeconomic History  . Marital status: Married    Spouse name: Antonio [name]  . Number of children: 3  . Years of education: Ph.D  . Highest education level: Not on file  Occupational History    Employer: [employer]  . Financial resource strain: Not on file  . Food insecurity    Worry: Not on file    Inability: Not on file  . Transportation needs    Medical: Not on file    Non-medical: Not on file  Tobacco Use  . Smoking status: Never Smoker  . Smokeless tobacco: Never Used  Substance and Sexual Activity  . Alcohol use: Yes    Alcohol/week: 5.0 standard drinks    Types: 5 Glasses of wine per week    Comment: 2 bottles of wine on  week-end.  . Drug use: No  . Sexual activity: Not on file  Lifestyle  . Physical activity    Days per week: Not on file    Minutes per session: Not on file  . Stress: Not on file  Relationships  . Social Herbalist on phone: Not on file    Gets together: Not on file    Attends religious service: Not on file    Active member of club or organization: Not on file    Attends meetings of clubs or organizations: Not on file    Relationship status: Not on file  Other Topics Concern  . Not on file  Social History Narrative   Patient is right handed   Patient lives at home with his wife Antonio [name]. 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   Caffeine- coffee on occasion    Patient has a Ph.D.          His Allergies Are:  No Known Allergies:   His Current Medications Are:  Outpatient Encounter Medications as of 07/26/2019  Medication Sig  . carbidopa-levodopa (SINEMET CR) 50-200 MG tablet Take 1 tablet by mouth at bedtime.  . carbidopa-levodopa (SINEMET IR) 25-100 MG tablet Take 1 tablet by mouth 5 (five) times daily. Take at 7 AM, 10 AM, 1 PM, 4 PM and 7 PM  . Cephalexin (KEFLEX PO) Take by mouth.  . pramipexole (MIRAPEX) 1 MG tablet Take 1 tablet (1 mg total) by mouth 3 (three) times daily. Taking at 8am, 12pm, and 5 pm  . rasagiline (AZILECT) 1 MG TABS tablet Take 1 tablet (1 mg total) by mouth daily.  . valACYclovir (VALTREX) 1000 MG tablet Take 1,000 mg by mouth as needed (cold sores).    No facility-administered encounter medications on file as of 07/26/2019.   :  Review of Systems:  Out of a complete 14 point review of systems, all are reviewed and negative with the exception of these symptoms as listed below:  Review of Systems  Neurological:       Pt presents today to discuss his PD. Pt feels that his PD has progressed. His stiffness and gait has worsened.    Objective:  Neurological Exam  Physical  Exam Physical Examination:   Vitals:   07/26/19 1305  BP: 137/81  Pulse: (!) 57    General Examination: The patient is a very pleasant 58 y.o. male in no acute distress. He appears well-developed and well-nourished and well groomed.   HEENT:Normocephalic, atraumatic, pupils are equal, round and reactive to light and accommodation. Wears corrective eyeglasses. Extraocular tracking shows mild to moderate saccadic breakdown, no obvious nystagmus. He has a decreased eye blink rate and moderate  facial masking, mild hypophonia, no dysarthria, oropharynx examination reveals mild mouth dryness, tongue protrudes centrally and palate elevates symmetrically. There is no drooling, no nasal drainage.No facial dyskinesias are noted.Well-healed scar from left DBS. Palpable electrode in the left neck area. Airway is somewhat crowded secondary to wider uvula and longer uvula.  Tonsils are small. Moderate nuchal rigidity   Chest:Clear to auscultation without wheezing, rhonchi or crackles noted.  Heart:S1+S2+0, regular and normal without murmurs, rubs or gallops noted.   Abdomen:Soft, non-tender and non-distended.  Extremities:There is2+ edema in the distal lower extremities bilaterally, Left more than right. Mild erythema.  Skin: Warm and dry without trophic changes noted.  Musculoskeletal: exam revealsmild bilateral knee swelling, right a little worse than left. Mild residual discomfort in both knees.  Neurologically:  Mental status: The patient is awake, alert and oriented in all 4 spheres. Hisimmediate and remote memory, attention, language skills and fund of knowledge are appropriate. There is no evidence of aphasia, agnosia, apraxia or anomia. Speech is clear with normal prosody and enunciation. Thought process is linear. Mood is normaland affect is normal.  Cranial nerves II - XII are as described above under HEENT exam.  Motor exam: Normal bulk, and strength are noted, he  hasfairly good range of motion, Slight intermittent resting tremor in the left upper extremity.  No obvious dyskinesias.  Tone is increased with cogwheeling bilaterally in the upper extremities.  Fine motor skills are mildly impaired bilaterally.  He has more slowness today.  He stands up with mild difficulty and posture is a little bit more stooped today.  He has more of a limp today and walks slowly with start hesitation and decreased arm swing bilaterally.  Sensory exam is intact to light touch.  CerebellarTesting shows no dysmetria. No gait ataxia.  Assessment and plan:   In summary, Antonio [name] a very pleasant  58 year old malewithan underlying medical history of arthritis with status post knee replacement surgeries, who presents for follow-up consultation of his right-sided predominant Parkinson's disease with diagnosis dating back to 2009 and symptoms dating back to about 2008. He is status post L DBS placement last year with generator placement in September and electrode placement in August 2019. He has had his history some programming appointment in December 2019 and had done rather well with it. His lower extremity swelling is worse. He has had progression of some of his symptoms, his gait and posture are indeed worse and he has a little bit more tremor today on the left.  I would like for him to go back to Sinemet 1 pill 5 times a day, I would like for him to reduce the Mirapex to 1 pill at night only.  His swelling has become worse and I worry that the Mirapex is at least in part to blame.  His history and exam are concerning for underlying obstructive sleep apnea.  He is advised to proceed with a sleep study, Which I will order.  He is advised to continue with Sinemet CR at bedtime and Azilect once daily in the morning.  He has an appointment next month with Elbert Ewings at [facility] for programming for his DBS.  He will also bring up the topic of planning his right DBS. He is willing to  consider CPAP therapy for sleep apnea.  We will keep him posted as to his sleep study results.  I renewed all his prescriptions.  He is advised that it can take several months for lower extremity swelling to improve. He had right total knee replacement in 2018 and left total knee replacement in 2019. I suggested a 4 month FU, sooner if needed. I answered all his questions today and he was in agreement. I spent 25 minutes in total face-to-face time with the patient, more than 50% of which was spent in counseling and coordination of care, reviewing test results, reviewing medication and discussing or reviewing the diagnosis of PD, OSA, the prognosis and treatment options. Pertinent laboratory and imaging test results that were available during this visit with the patient were reviewed by me and considered in my medical decision making (see chart for details).

## 2019-07-26 NOTE — Patient Instructions (Addendum)
Let's go down on the Mirapex to 1 pill at bedtime only to help with your restless legs but also to see if your swelling does improve with time, it can take months to improve.  Let us increase your Sinemet back to 1 pill 5 times a day.  Continue with the long-acting Sinemet at night and Azilect in the morning once daily.  We can restart the baclofen at night for muscle cramping, take 1 pill at night as needed, 10 mg strength. Perhaps tweaking your programming will help at the next month's appointment with Elbert Ewings. I will order a sleep study for your sleep related symptoms including snoring, daytime sleepiness, sleep disruption, and nighttime urination. I will follow-up in 4 months with you, sooner if needed.  If you have sleep apnea, we will consider CPAP or AutoPap therapy.

## 2019-07-27 MED ORDER — PRAMIPEXOLE DIHYDROCHLORIDE 1 MG PO TABS: 1.0000 mg | ORAL_TABLET | Freq: Every day | ORAL | 3 refills | Status: DC

## 2019-07-27 NOTE — Addendum Note (Signed)
Addended by: Lester Casa A on: 07/27/2019 08:39 AM   Modules accepted: Orders

## 2019-08-27 ENCOUNTER — Ambulatory Visit (INDEPENDENT_AMBULATORY_CARE_PROVIDER_SITE_OTHER): Payer: BC Managed Care – PPO | Admitting: Neurology

## 2019-08-27 DIAGNOSIS — G4710 Hypersomnia, unspecified: Secondary | ICD-10-CM

## 2019-08-27 DIAGNOSIS — E669 Obesity, unspecified: Secondary | ICD-10-CM

## 2019-08-27 DIAGNOSIS — R252 Cramp and spasm: Secondary | ICD-10-CM

## 2019-08-27 DIAGNOSIS — G4720 Circadian rhythm sleep disorder, unspecified type: Secondary | ICD-10-CM

## 2019-08-27 DIAGNOSIS — R351 Nocturia: Secondary | ICD-10-CM

## 2019-08-27 DIAGNOSIS — G479 Sleep disorder, unspecified: Secondary | ICD-10-CM

## 2019-08-27 DIAGNOSIS — R0683 Snoring: Secondary | ICD-10-CM

## 2019-08-27 DIAGNOSIS — G4719 Other hypersomnia: Secondary | ICD-10-CM

## 2019-08-27 DIAGNOSIS — G20 Parkinson's disease: Secondary | ICD-10-CM

## 2019-09-04 ENCOUNTER — Telehealth: Payer: Self-pay

## 2019-09-04 NOTE — Telephone Encounter (Signed)
-----   Message from Star Age, MD sent at 09/04/2019  9:00 AM EDT ----- Patient last seen on 07/26/19 for PD FU and had sleep related complaints of snoring, EDS, RLS. Has PSG on 08/27/19:  Please call and notify the patient that the recent sleep study did not show any significant obstructive sleep apnea. He had overall mild snoring and some sleep disruption, achieved all stages of sleep, though.  No significant PLMs.  He can FU as planned in Jan. No appt shows up. Please arrange a followup appointment. EDS may be in part d/t medications.  Thanks,  Star Age, MD, PhD [name])

## 2019-09-04 NOTE — Procedures (Signed)
PATIENT'S NAME:  Antonio [name], Antonio [name] DOB:      [birth year]      MR#:    [id number]     DATE OF RECORDING: 08/27/2019 REFERRING M.D.:  Guadelupe Sabin, MD, PhD Study Performed:   Baseline Polysomnogram HISTORY: 58 year-old man with a history of Parkinson's disease, who reports symptoms of restless leg syndrome, snoring and excessive daytime sleepiness. The patient endorsed the Epworth Sleepiness Scale at 19 points. The patient's weight 271 pounds with a height of 74 (inches), resulting in a BMI of 34.8 kg/m2. The patient's neck circumference measured 18.5 inches.  CURRENT MEDICATIONS: Sinemet, Keflex, Mirapex, Azilect, Valtrex   PROCEDURE:  This is a multichannel digital polysomnogram utilizing the Somnostar 11.2 system.  Electrodes and sensors were applied and monitored per AASM Specifications.   EEG, EOG, Chin and Limb EMG, were sampled at 200 Hz.  ECG, Snore and Nasal Pressure, Thermal Airflow, Respiratory Effort, CPAP Flow and Pressure, Oximetry was sampled at 50 Hz. Digital video and audio were recorded.      BASELINE STUDY  Lights Out was at 21:49 and Lights On at 05:00.  Total recording time (TRT) was 432 minutes, with a total sleep time (TST) of 276 minutes.   The patient's sleep latency was 51.5 minutes, which is delayed. REM latency was 77 minutes, which is normal. The sleep efficiency was 63.9%, which is reduced.     SLEEP ARCHITECTURE: WASO (Wake after sleep onset) was 104.5 minutes with one longer period of wakefulness. There were 29 minutes in Stage N1, 121.5 minutes Stage N2, 72 minutes Stage N3 and 53.5 minutes in Stage REM. The percentage of Stage N1 was 10.5%, which is increased, Stage N2 was 44.%, Stage N3 was 26.1%, which is increased, and Stage R (REM sleep) was 19.4%, which is normal.  RESPIRATORY ANALYSIS: There were a total of 2 respiratory events:  0 obstructive apneas, 0 central apneas and 0 mixed apneas with a total of 0 apneas and an apnea index (AI) of 0/hour. There were 2 hypopneas  with a hypopnea index of .4 /hour. The patient also had 0 respiratory event related arousals (RERAs).      The total APNEA/HYPOPNEA INDEX (AHI) was .4 /hour and the total RESPIRATORY DISTURBANCE INDEX was 0. .4 /hour.  1 events occurred in REM sleep and 2 events in NREM. The REM AHI was 1.1 /hour, versus a non-REM AHI of .3. The patient spent 246 minutes of total sleep time in the supine position and 30 minutes in non-supine.. The supine AHI was 0.2 versus a non-supine AHI of 2.0.  OXYGEN SATURATION & C02:  The Wake baseline 02 saturation was 97%, with the lowest being 87%. Time spent below 89% saturation equaled 2 minutes.   PERIODIC LIMB MOVEMENTS: The patient had a total of 0 Periodic Limb Movements.  The Periodic Limb Movement (PLM) index was 0 and the PLM Arousal index was 0/hour. The arousals were noted as: 36 were spontaneous, 0 were associated with PLMs, 0 were associated with respiratory events. Audio and video analysis did not show any abnormal or unusual movements, behaviors, phonations or vocalizations. The patient took 1 bathroom break. Mild snoring was noted. The EKG was in keeping with normal sinus rhythm (NSR).  Post-study, the patient indicated that sleep was better than usual.   IMPRESSION:  1. Primary Snoring 2. Dysfunctions associated with sleep stages or arousal from sleep  RECOMMENDATIONS:  1. This study does not demonstrate any significant obstructive or central sleep disordered breathing with the  exception of snoring, which was mostly in the mild range. This study does not support an intrinsic sleep disorder as a cause of the patient's symptoms. Other causes, including circadian rhythm disturbances, an underlying mood disorder, medication effect and/or an underlying medical problem cannot be ruled out. 2. This study shows sleep fragmentation and abnormal sleep stage percentages; these are nonspecific findings and per se do not signify an intrinsic sleep disorder or a cause  for the patient's sleep-related symptoms. Causes include (but are not limited to) the first night effect of the sleep study, circadian rhythm disturbances, medication effect or an underlying mood disorder or medical problem.  3. The patient should be cautioned not to drive, work at heights, or operate dangerous or heavy equipment when tired or sleepy. Review and reiteration of good sleep hygiene measures should be pursued with any patient. 4. The patient will be seen in follow-up at [facility]  I certify that I have reviewed the entire raw data recording prior to the issuance of this report in accordance with the Standards of Accreditation of the American Academy of Sleep Medicine (AASM)   Huston Foley, MD, PhD Diplomat, American Board of Neurology and Sleep Medicine (Neurology and Sleep Medicine)

## 2019-09-04 NOTE — Telephone Encounter (Signed)
I called pt and discussed his sleep study results and recommendations with him. Pt scheduled an appt for 12/19/19. Pt verbalized understanding of results. Pt had no questions at this time but was encouraged to call back if questions arise.

## 2019-09-04 NOTE — Progress Notes (Signed)
Patient last seen on 07/26/19 for PD FU and had sleep related complaints of snoring, EDS, RLS. Has PSG on 08/27/19:  Please call and notify the patient that the recent sleep study did not show any significant obstructive sleep apnea. He had overall mild snoring and some sleep disruption, achieved all stages of sleep, though.  No significant PLMs.  He can FU as planned in Jan. No appt shows up. Please arrange a followup appointment. EDS may be in part d/t medications.  Thanks,  Antonio Age, MD, PhD [name])

## 2019-12-19 ENCOUNTER — Encounter: Payer: Self-pay | Admitting: Neurology

## 2019-12-19 ENCOUNTER — Ambulatory Visit (INDEPENDENT_AMBULATORY_CARE_PROVIDER_SITE_OTHER): Payer: BC Managed Care – PPO | Admitting: Neurology

## 2019-12-19 ENCOUNTER — Other Ambulatory Visit: Payer: Self-pay

## 2019-12-19 VITALS — BP 135/82 | HR 64 | Temp 96.4°F | Ht 73.5 in | Wt 271.0 lb

## 2019-12-19 DIAGNOSIS — R252 Cramp and spasm: Secondary | ICD-10-CM

## 2019-12-19 DIAGNOSIS — G20 Parkinson's disease: Secondary | ICD-10-CM | POA: Diagnosis not present

## 2019-12-19 DIAGNOSIS — K5901 Slow transit constipation: Secondary | ICD-10-CM

## 2019-12-19 DIAGNOSIS — Z9689 Presence of other specified functional implants: Secondary | ICD-10-CM | POA: Diagnosis not present

## 2019-12-19 DIAGNOSIS — R600 Localized edema: Secondary | ICD-10-CM

## 2019-12-19 DIAGNOSIS — G479 Sleep disorder, unspecified: Secondary | ICD-10-CM

## 2019-12-19 MED ORDER — CARBIDOPA-LEVODOPA ER 50-200 MG PO TBCR: 1.0000 | EXTENDED_RELEASE_TABLET | Freq: Every day | ORAL | 3 refills | Status: DC

## 2019-12-19 MED ORDER — NEUPRO 2 MG/24HR TD PT24: 1.0000 | MEDICATED_PATCH | Freq: Every day | TRANSDERMAL | 12 refills | Status: DC

## 2019-12-19 NOTE — Progress Notes (Signed)
Subjective:    Patient ID: Antonio [name] is a 59 y.o. male.  HPI     Interim history:   Mr. Antonio [name] is a very pleasant 59 year old right-handed gentleman with an underlying benign medical history, who presents for follow-up consultation of his right-sided predominant Parkinson's disease, s/p L DBS, and his restless leg syndrome. The patient is unaccompanied today. I last saw him on 07/26/2019, at which time he felt some progression of his symptoms.  He had some leg cramping at night.  He felt his sleep was interrupted, he had more daytime somnolence.  He had reduced his Mirapex to 1 pill once daily for leg swelling.  He did increase it back up as the leg swelling stayed the same.  He had also reduced his Sinemet to 1 pill 4 times a day.  I suggested we proceed with a sleep study.  He was also advised to increase his Sinemet to 1 pill 5 times a day, keep his Sinemet CR at bedtime and Azilect once daily in the morning. He had a baseline polysomnogram on 08/27/2019 which did not show any significant sleep disordered breathing, he had mild snoring.  He had no significant PLM's.  We called him with his test results at the time.  Today, 12/19/2019: He reports feeling worse with his balance, sleep is still a problem, takes melatonin, has stiffness and cramping and more rigidity at night.  He has tried magnesium, a supplement called calm as per advice from [employer].  He saw her on 10/03/2019, had additional programming.  He has not increased his settings as advised but is considering it.  He also has an appointment in about a month with Dr. Linus Mako to talk about his second DBS.  He has fallen about 2-3 times since her last visit, particularly in the context of carrying some things such as rocks or lumber or a 2 x 10. Thankfully, he has not sustained any injuries. He also walks on uneven ground at the same time and sometimes he had been walking backwards.  He is strongly advised not to lift any heavy rocks or  lumbar and certainly not to walk backwards.  He has not scheduled his physical therapy appointment yet, he was referred by Elbert Ewings, nurse practitioner.  He takes Sinemet IR 5 times a day about 3 hourly and takes the Sinemet CR around 10 PM, along with 1 mg of Mirapex.  He feels that his lower extremity swelling has improved a little bit since scaling back on the Mirapex.  He uses his recumbent bike on a regular basis, typically in the evening. He has intermittent constipation.  He will be starting Metamucil.    The patient's allergies, current medications, family history, past medical history, past social history, past surgical history and problem list were reviewed and updated as appropriate.    Previously (copied from previous notes for reference):      I saw him on 01/23/2019, at which time he reported doing well.  He had a good programming appointment in 2019 in December which helped him quite a bit.  He was active physically.  He had some residual knee discomfort.  He was taking Advil as needed for this.  He had reduced his Mirapex on his own by skipping a dose here and there.  He was taking his nighttime dose consistently because of his restless legs.  Since he had some lower extremity swelling I suggested he reduce his Mirapex to half a pill in the  morning and midday and 1 pill at night     I saw him on 07/20/2018, at which time he was recently status post left subthalamic DBS electrode placement on 07/14/2018, with generator placement pending for September 2019, on 08/22/2018. 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 He had prior right total knee replacement. I suggested we keep his PD meds stable.      I saw him on 01/20/2018, at which time he was fairly stable. He had stopped taking Lexapro. He had undergone right total knee replacement surgery recently and was looking at left total knee replacement. He had DBS consultation at [facility] and was supposed to see a  neurosurgeon next. He had a recent fall. Thankfully he did not injure himself. I suggested we continue his medication regimen.        I saw him on 09/22/2017, at which time he reported a fall. His right knee was bothering him. He was scheduled for right total knee replacement on 09/28/2017. He was also scheduled for a DBS consultation at [facility].   I saw him on 06/22/2017, at which time he reported more off time. He felt he was more progressed in his symptoms. His wife reported that he had fallen several times. He had more recent stressors. His wife had noted more freezing. He was more irritable and frustrated easier. He had more issues with his right knee, most likely needing total knee replacement. I suggested we increase his Sinemet to 1 pill 5 times a day and keep his Mirapex 1 mg 3 times a day, Sinemet CR bedtime, and keep his Azilect the same. I suggested an opinion for DBS evaluation at [facility]. I also suggested he start taking Lexapro 5 mg strength.    I saw him on 02/16/2017, at which time he was on Sinemet 4 times a day and Sinemet CR at bedtime. He had to have interim right knee surgery after an injury. He was still on hydrocodone as needed for pain. 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 Lower extremity swelling was about the same, memory stable, no recent falls. No side effects from his medication. He was off of Azilect for his surgery and was able to restarted without problems after about 2 weeks. I asked him to continue with Mirapex 3 times a day, Sinemet CR bedtime, Sinemet IR 4 times a day and Azilect once daily.   I saw him on 05/12/2016, at which time he was reporting right leg pain and thigh pain. This seemed to be worse in between Sinemet doses. He was on Sinemet 3 times a day but was not always keeping a set schedule for it. He was also taking the CR Sinemet at night more consistently. I suggested low-dose baclofen as needed 10 mg strength for his  right leg muscle tension and pain. I suggested we continue with Azilect and Mirapex at the current doses but increase his Sinemet to 1 pill 4 times a day at 7, 11, 3 PM and 7 PM. He was also encouraged to take the CR at night.     I saw him on 01/13/16, at which time he reported feeling fairly stable. He did not continue with the CR at night as he did not notice much in the way of difference. He denied any recent restless leg symptoms. He did feel Mirapex was helpful for that. He was taking Sinemet 3 times a day and Mirapex 3 times a  day, Azilect 1 mg once daily, which was generic. He reported fall about a week prior to his last appointment. He scraped his knee.he does not always drink enough water. He had some right-sided sciatica symptoms and radiating pain to the back of his thigh.  I ordered an MRI L spine wo contrast, which he had on 01/22/16:  IMPRESSION:  This MRI of the lumbar spine without contrast shows the following: 1.    At L3-L4, there is a midline disc protrusion and mild facet hypertrophy combining to cause borderline spinal stenosis and moderate left lateral recess stenosis there does not appear to be nerve root compression though there is some encroachment upon the traversing left L4 nerve root. 2.   There are milder degenerative changes at L2-L3, L4-L5 and L5-S1 with less potential for nerve root impingement. 3.   There are no acute findings.   We called him with the test results.    I saw him on 09/09/2015, at which time he reported that the addition of Azilect was helpful. I suggested he continue with Sinemet tid, Mirapex 1 mg tid, Azilect once daily and try C/L CR at night.   I saw him on 04/24/2015, at which time the patient reported worsening balance and increase in his tremors. Unfortunately, he had fallen recently and fell backwards as he stood up from a chair and fell onto his back after which she felt sore for a couple of days but thankfully did not injure himself seriously. He  did not have any head injury or loss of consciousness. He was working 2 jobs. He was not drinking enough water. He was not sleeping as well at night. Memory was stable. He denied significant depressive symptoms. His restless legs symptoms were under control. I suggested she continue with Sinemet and Mirapex, but I asked him to restart Azilect which he had tried in the past. We talked about potentially pursuing DBS surgery in the future.   I first met him on 12/25/2014, at which time he reported improved parkinsonian symptoms with Mirapex and Sinemet. I asked him to continue his medications, Mirapex 1 mg strength one pill 3 times a day and Sinemet 25-100 milligrams strength one pill 3 times a day. I suggested he could add a half pill of Sinemet as needed in the evenings.    He previously followed with Dr. Jim Like was last seen by him on 07/17/2014, at which time the patient reported having stopped Sinemet and he was using it as needed only. His Mirapex was increased to 1 mg 3 times a day as he also reported residual restless leg symptoms. The addition of Azilect was discussed.  He talked about DBS for future consideration.   Prior to that he was followed by Dr. Morene Antu and was last seen by Dr. Erling Cruz on 11/25/2011. He was originally seen by Dr. Erling Cruz in November 2009 at which time he presented with right-sided stiffness and fine motor dyscontrol. He has a positive family history of Parkinson's disease in his father. He was initially started on Azilect and Mirapex was added in 2011. He started Sinemet 25-100 milligrams strength one pill 3 times a day and eventually stop the Azilect, due to side effects reported, including cognitive SEs.   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 He is a non-smoker, he drinks wine usually on weekends.    He noted a R hand tremor in the last 6 months. He  has work related stress. He sleeps well generally speaking. Confined spaces  are more difficulty to negotiate. He has mild memory issues. He has not fallen. He did not do well without the Sinemet and has since then restarted it. He takes it 3 times a day along with one pill of Mirapex. He has not taken his midday dose yet. He has no significant side effects except for mild sleepiness. He has never fallen asleep while driving. He has mild swelling in his feet at times especially on longer plane rides. He has to travel internationally maybe 3 times a year. His medication dose does last for 4 and sometimes 5 hours but at the end of the day especially if he's had a long work day he feels fatigued and more off. He goes to bed late. It is usually between midnight and 1 AM. He has a rise time of 7:30 AM. He snores when he sleeps on his back but does not have any gasping sensations are witnessed apneas as he recalls. His RLS symptoms are under control.    His Past Medical History Is Significant For: Past Medical History:  Diagnosis Date  . Arthritis   . Dyskinesia    occasionally   . Hx of colonic polyps 12/10/2010  . Leg pain left  . Parkinson's disease ([place])     His Past Surgical History Is Significant For: Past Surgical History:  Procedure Laterality Date  . COLONOSCOPY    . KNEE ARTHROSCOPY Right 01/2017   with Dr Alvan Dame ;at surgical center   . None listed    . TOTAL KNEE ARTHROPLASTY Right 09/28/2017   Procedure: RIGHT TOTAL KNEE ARTHROPLASTY;  Surgeon: Paralee Cancel, MD;  Location: [facility];  Service: Orthopedics;  Laterality: Right;  70 mins  . TOTAL KNEE ARTHROPLASTY Left 04/12/2018   Procedure: LEFT TOTAL KNEE ARTHROPLASTY;  Surgeon: Paralee Cancel, MD;  Location: [facility];  Service: Orthopedics;  Laterality: Left;  Adductor Block    His Family History Is Significant For: Family History  Problem Relation Age of Onset  . Heart disease Mother   . Diabetes Father   . Parkinson's disease Father   . Colon cancer Neg Hx   . Esophageal cancer Neg Hx   . Rectal cancer Neg Hx    . Stomach cancer Neg Hx     His Social History Is Significant For: Social History   Socioeconomic History  . Marital status: Married    Spouse name: Lanelle Bal  . Number of children: 3  . Years of education: Ph.D  . Highest education level: Not on file  Occupational History    Employer: [employer]  Tobacco Use  . Smoking status: Never Smoker  . Smokeless tobacco: Never Used  Substance and Sexual Activity  . Alcohol use: Yes    Alcohol/week: 5.0 standard drinks    Types: 5 Glasses of wine per week    Comment: 2 bottles of wine on week-end.  . Drug use: No  . Sexual activity: Not on file  Other Topics Concern  . Not on file  Social History Narrative   Patient is right handed   Patient lives at home with his wife Lanelle Bal. 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   Caffeine- coffee on occasion    Patient has a Ph.D.         Social Determinants of Health   Financial Resource Strain:   . Difficulty of Paying  Living Expenses: Not on file  Food Insecurity:   . Worried About Charity fundraiser in the Last Year: Not on file  . Ran Out of Food in the Last Year: Not on file  Transportation Needs:   . Lack of Transportation (Medical): Not on file  . Lack of Transportation (Non-Medical): Not on file  Physical Activity:   . Days of Exercise per Week: Not on file  . Minutes of Exercise per Session: Not on file  Stress:   . Feeling of Stress : Not on file  Social Connections:   . Frequency of Communication with Friends and Family: Not on file  . Frequency of Social Gatherings with Friends and Family: Not on file  . Attends Religious Services: Not on file  . Active Member of Clubs or Organizations: Not on file  . Attends Archivist Meetings: Not on file  . Marital Status: Not on file    His Allergies Are:  No Known Allergies:   His Current Medications Are:  Outpatient Encounter Medications as of  12/19/2019  Medication Sig  . baclofen (LIORESAL) 10 MG tablet Take 1 tablet (10 mg total) by mouth at bedtime as needed for muscle spasms.  . carbidopa-levodopa (SINEMET CR) 50-200 MG tablet Take 1 tablet by mouth at bedtime.  . carbidopa-levodopa (SINEMET IR) 25-100 MG tablet Take 1 tablet by mouth 5 (five) times daily. Take at 7 AM, 10 AM, 1 PM, 4 PM and 7 PM  . Cephalexin (KEFLEX PO) Take by mouth.  . pramipexole (MIRAPEX) 1 MG tablet Take 1 tablet (1 mg total) by mouth at bedtime.  . rasagiline (AZILECT) 1 MG TABS tablet Take 1 tablet (1 mg total) by mouth daily.  . valACYclovir (VALTREX) 1000 MG tablet Take 1,000 mg by mouth as needed (cold sores).    No facility-administered encounter medications on file as of 12/19/2019.  :  Review of Systems:  Out of a complete 14 point review of systems, all are reviewed and negative with the exception of these symptoms as listed below: Review of Systems  Neurological:       Reports decreased balance since last visit along with increased tremors in left hand. Reports 2-3 falls since last visit.     Objective:  Neurological Exam  Physical Exam Physical Examination:   Vitals:   12/19/19 0934  BP: 135/82  Pulse: 64  Temp: (!) 96.4 F (35.8 C)    General Examination: The patient is a very pleasant 59 y.o. male in no acute distress. He appears well-developed and well-nourished and well groomed. Off meds this AM.  HEENT:Normocephalic, atraumatic, pupils are equal, round and reactive to light and accommodation. Wears corrective eyeglasses. Extraocular tracking shows mildto moderate saccadic breakdown, noobviousnystagmus. He has a decreased eye blink rate and moderate  facial masking,mildhypophonia, no dysarthria, oropharynx examination reveals mildmouth dryness, tongue protrudes centrally and palate elevates symmetrically. There is no drooling, no nasal drainage.No facial dyskinesias are noted.Well-healed scar from left DBS. Palpable  electrode in the left neck area. Airway is somewhat crowded secondary to wider uvula and longer uvula.  Tonsils are small. Moderate nuchal rigidity.    Chest:Clear to auscultation without wheezing, rhonchi or crackles noted.  Heart:S1+S2+0, regular and normal without murmurs, rubs or gallops noted.   Abdomen:Soft, non-tender and non-distended.  Extremities:There is2+edema in the distal lower extremities bilaterally, Left more than right.Mild erythema. Not much changed from before.  Skin: Warm and dry without trophic changes noted.  Musculoskeletal: exam revealsmild bilateral  knee swelling, right a little worse than left. Mild residual discomfort in both knees.  Neurologically:  Mental status: The patient is awake, alert and oriented in all 4 spheres. Hisimmediate and remote memory, attention, language skills and fund of knowledge are appropriate. There is no evidence of aphasia, agnosia, apraxia or anomia. Speech is clear with normal prosody and enunciation. Thought process is linear. Mood is normaland affect is normal.  Cranial nerves II - XII are as described above under HEENT exam.  Motor exam: Normal bulk, and strength are noted, he hasfairly good range of motion, Mild to moderate resting tremor in the left upper extremity noted, no obvious dyskinesias, tone is increased in both upper extremities and mildly so in both lower extremities, he is off medication this morning.  He has moderate impairment of his fine motor skills, more slowness, also in the moderate range.  He stands up with mild to moderate difficulty and his posture is stooped, he has slight difficulty with turns and mild start hesitation, walks with decreased arm swing left more than right. Sensory exam is intact to light touch.  CerebellarTesting shows no dysmetria. No gait ataxia.  Assessment and plan:   In summary, ADOLPHO MEENACH a very pleasant 59 year old malewithan underlying medical historyof  arthritis with status post knee replacement surgeries, who presents for follow-up consultation of his right-sided predominant Parkinson's disease with diagnosis dating back to 2009 and symptoms dating back to about 2008. He is status post L DBS placement in 2019, generator placement in September and electrode placement in August 2019. Overall, he has done well, his left-sided symptoms have become worse.  His lower extremity swelling has become worse over time.  He is on low-dose Mirapex, 1 mg at night only.  I would like for him to taper this off at this time.  He is advised to reduce it to half a pill each night for the next week and then stop it altogether.  He is advised to start Neupro patch.  He recalls having perhaps tried Neupro in the past with Dr. Erling Cruz, I did not make a notation of that in my past notes.  He is willing to try it.  We will try the 2 mg strength first and then increase if needed.  He is advised to continue with Sinemet 1 pill 5 times a day and Sinemet CR at bedtime as well as Azilect once daily in the morning.  He has a appointment with Dr. Linus Mako next month. He would like to go ahead and plan for his right-sided DBS.  He has had more balance problems.  He is cautioned against heavy lifting and especially lifting something large or heavy on uneven ground.Of note, he had right total knee replacement in 2018 and left total knee replacementin 2019.  He is advised that balance problems and posture changes do not tend to get better even with optimal medication regimen sometimes.  He is also advised that there may be a chance of improving his start hesitation and balance problem once he has bilateral DBS placements but there is unfortunately no guarantee for that either.  He is advised to follow-up with me in about 3 months, we talked about the importance of physical activity safely.  We talked about the importance of fall prevention and being proactive about constipation issues.  I answered all  his questions today and he was in agreement. I spent 30 minutes in total face-to-face time and in reviewing records during pre-charting, more than  50% of which was spent in counseling and coordination of care, reviewing test results, reviewing medication and discussing or reviewing the diagnosis of PD, the prognosis and treatment options. Pertinent laboratory and imaging test results that were available during this visit with the patient were reviewed by me and considered in my medical decision making (see chart for details).

## 2019-12-19 NOTE — Patient Instructions (Addendum)
As discussed, we will keep your Sinemet IR and Sinemet CR at the same.  Your prescriptions are updated. Please also take your Azilect once daily in the morning.  We will reduce your Mirapex to half a pill each evening for the next week and then stop it, please start Neupro patch 2 mg strength 1 patch every evening.  I am hoping that the Neupro patch will help you with your restless leg symptoms and also have a less likelihood of causing you swelling.

## 2020-01-18 ENCOUNTER — Encounter: Payer: Self-pay | Admitting: Neurology

## 2020-01-29 ENCOUNTER — Telehealth: Payer: Self-pay | Admitting: Neurology

## 2020-01-29 ENCOUNTER — Encounter: Payer: Self-pay | Admitting: Neurology

## 2020-01-29 MED ORDER — ROTIGOTINE 4 MG/24HR TD PT24: 1.0000 | MEDICATED_PATCH | Freq: Every day | TRANSDERMAL | 12 refills | Status: DC

## 2020-01-29 NOTE — Telephone Encounter (Signed)
Please call pt regarding his My Chart message:  I have changed his Rx for neupro from 2 mg to 4 mg daily for increase in RLS symptoms and increase in tremor noted. As far as gum problems and receding gum line: I have not come across this issue as a SE from Neupro. I would recommend he consult with his dentist soon. He can also send a message to Dr. Rubin Payor, to see, if he is aware of this problem with taking PD meds and receding gums.

## 2020-01-29 NOTE — Telephone Encounter (Signed)
I have reached out to the pt via my chart advising on Dr. Teofilo Pod message.

## 2020-03-18 ENCOUNTER — Ambulatory Visit: Payer: BC Managed Care – PPO | Admitting: Neurology

## 2020-03-21 ENCOUNTER — Ambulatory Visit: Payer: BC Managed Care – PPO | Admitting: Neurology

## 2020-03-21 ENCOUNTER — Other Ambulatory Visit: Payer: Self-pay

## 2020-03-21 ENCOUNTER — Encounter: Payer: Self-pay | Admitting: Neurology

## 2020-03-21 VITALS — BP 132/80 | HR 69 | Temp 99.1°F | Ht 74.0 in | Wt 264.3 lb

## 2020-03-21 DIAGNOSIS — Z9689 Presence of other specified functional implants: Secondary | ICD-10-CM | POA: Diagnosis not present

## 2020-03-21 DIAGNOSIS — G20 Parkinson's disease: Secondary | ICD-10-CM | POA: Diagnosis not present

## 2020-03-21 MED ORDER — BACLOFEN 10 MG PO TABS: 10.0000 mg | ORAL_TABLET | Freq: Every evening | ORAL | 3 refills | Status: DC | PRN

## 2020-03-21 NOTE — Progress Notes (Signed)
Subjective:    Patient ID: Antonio [name] is a 59 y.o. male.  HPI     Interim history:    Antonio [name] is a very pleasant 59 year old right-handed gentleman with an underlying benign medical history, who presents for follow-up consultation of his right-sided predominant Parkinson's disease, s/p L DBS, and his restless leg syndrome. The patient is unaccompanied today. I last saw him on 12/19/2019, at which time he reported that his balance was worse.  He had had some falls without injuries.  I suggested we taper him off of Mirapex and start him on Neupro patch.  We increased the patch in the interim in March to 4 mg once daily.   Today, 03/21/2020: He reports that the Neupro has been helpful.  He still has residual restless leg symptoms at night which are particularly bothersome when he actually lays in bed.  He has had some falls, particularly when walking on uneven ground and when carrying something outside.  He has not had any injuries thankfully.  He had interim consultation with Dr. Deboraha Sprang and Dr. Arlan Organ for his second DBS on the right as his left hand tremor has become worse consistently.  He is scheduled for his DBS phase 1 on 05/02/2020.  He does not need a phase 2 as he has a generator with 2 wires as I understand.  He does not take the baclofen regularly when it helps for cramping as needed.  He has not had any new concerns, he knows to stop the Azilect before his surgery, he had to do it before his first surgery as well as he recalls.    The patient's allergies, current medications, family history, past medical history, past social history, past surgical history and problem list were reviewed and updated as appropriate.    Previously (copied from previous notes for reference):    I saw him on 07/26/2019, at which time he felt some progression of his symptoms.  He had some leg cramping at night.  He felt his sleep was interrupted, he had more daytime somnolence.  He had reduced his Mirapex to 1  pill once daily for leg swelling.  He did increase it back up as the leg swelling stayed the same.  He had also reduced his Sinemet to 1 pill 4 times a day.  I suggested we proceed with a sleep study.  He was also advised to increase his Sinemet to 1 pill 5 times a day, keep his Sinemet CR at bedtime and Azilect once daily in the morning. He had a baseline polysomnogram on 08/27/2019 which did not show any significant sleep disordered breathing, he had mild snoring.  He had no significant PLM's.  We called him with his test results at the time.    I saw him on 01/23/2019, at which time he reported doing well.  He had a good programming appointment in 2019 in December which helped him quite a bit.  He was active physically.  He had some residual knee discomfort.  He was taking Advil as needed for this.  He had reduced his Mirapex on his own by skipping a dose here and there.  He was taking his nighttime dose consistently because of his restless legs.  Since he had some lower extremity swelling I suggested he reduce his Mirapex to half a pill in the morning and midday and 1 pill at night     I saw him on 07/20/2018, at which time he was recently status post left  subthalamic DBS electrode placement on 07/14/2018, with generator placement pending for September 2019, on 08/22/2018. 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 He had prior right total knee replacement. I suggested we keep his PD meds stable.      I saw him on 01/20/2018, at which time he was fairly stable. He had stopped taking Lexapro. He had undergone right total knee replacement surgery recently and was looking at left total knee replacement. He had DBS consultation at [facility] and was supposed to see a neurosurgeon next. He had a recent fall. Thankfully he did not injure himself. I suggested we continue his medication regimen.        I saw him on 09/22/2017, at which time he reported a fall. His right knee was bothering him.  He was scheduled for right total knee replacement on 09/28/2017. He was also scheduled for a DBS consultation at [facility].   I saw him on 06/22/2017, at which time he reported more off time. He felt he was more progressed in his symptoms. His wife reported that he had fallen several times. He had more recent stressors. His wife had noted more freezing. He was more irritable and frustrated easier. He had more issues with his right knee, most likely needing total knee replacement. I suggested we increase his Sinemet to 1 pill 5 times a day and keep his Mirapex 1 mg 3 times a day, Sinemet CR bedtime, and keep his Azilect the same. I suggested an opinion for DBS evaluation at [facility]. I also suggested he start taking Lexapro 5 mg strength.    I saw him on 02/16/2017, at which time he was on Sinemet 4 times a day and Sinemet CR at bedtime. He had to have interim right knee surgery after an injury. He was still on hydrocodone as needed for pain. 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 Lower extremity swelling was about the same, memory stable, no recent falls. No side effects from his medication. He was off of Azilect for his surgery and was able to restarted without problems after about 2 weeks. I asked him to continue with Mirapex 3 times a day, Sinemet CR bedtime, Sinemet IR 4 times a day and Azilect once daily.   I saw him on 05/12/2016, at which time he was reporting right leg pain and thigh pain. This seemed to be worse in between Sinemet doses. He was on Sinemet 3 times a day but was not always keeping a set schedule for it. He was also taking the CR Sinemet at night more consistently. I suggested low-dose baclofen as needed 10 mg strength for his right leg muscle tension and pain. I suggested we continue with Azilect and Mirapex at the current doses but increase his Sinemet to 1 pill 4 times a day at 7, 11, 3 PM and 7 PM. He was also encouraged to take the CR at night.     I  saw him on 01/13/16, at which time he reported feeling fairly stable. He did not continue with the CR at night as he did not notice much in the way of difference. He denied any recent restless leg symptoms. He did feel Mirapex was helpful for that. He was taking Sinemet 3 times a day and Mirapex 3 times a day, Azilect 1 mg once daily, which was generic. He reported fall about a week prior to his last appointment. He scraped his knee.he does not  always drink enough water. He had some right-sided sciatica symptoms and radiating pain to the back of his thigh.  I ordered an MRI L spine wo contrast, which he had on 01/22/16:  IMPRESSION:  This MRI of the lumbar spine without contrast shows the following: 1.    At L3-L4, there is a midline disc protrusion and mild facet hypertrophy combining to cause borderline spinal stenosis and moderate left lateral recess stenosis there does not appear to be nerve root compression though there is some encroachment upon the traversing left L4 nerve root. 2.   There are milder degenerative changes at L2-L3, L4-L5 and L5-S1 with less potential for nerve root impingement. 3.   There are no acute findings.   We called him with the test results.    I saw him on 09/09/2015, at which time he reported that the addition of Azilect was helpful. I suggested he continue with Sinemet tid, Mirapex 1 mg tid, Azilect once daily and try C/L CR at night.   I saw him on 04/24/2015, at which time the patient reported worsening balance and increase in his tremors. Unfortunately, he had fallen recently and fell backwards as he stood up from a chair and fell onto his back after which she felt sore for a couple of days but thankfully did not injure himself seriously. He did not have any head injury or loss of consciousness. He was working 2 jobs. He was not drinking enough water. He was not sleeping as well at night. Memory was stable. He denied significant depressive symptoms. His restless legs  symptoms were under control. I suggested she continue with Sinemet and Mirapex, but I asked him to restart Azilect which he had tried in the past. We talked about potentially pursuing DBS surgery in the future.   I first met him on 12/25/2014, at which time he reported improved parkinsonian symptoms with Mirapex and Sinemet. I asked him to continue his medications, Mirapex 1 mg strength one pill 3 times a day and Sinemet 25-100 milligrams strength one pill 3 times a day. I suggested he could add a half pill of Sinemet as needed in the evenings.    He previously followed with Dr. Jim Like was last seen by him on 07/17/2014, at which time the patient reported having stopped Sinemet and he was using it as needed only. His Mirapex was increased to 1 mg 3 times a day as he also reported residual restless leg symptoms. The addition of Azilect was discussed.  He talked about DBS for future consideration.   Prior to that he was followed by Dr. Morene Antu and was last seen by Dr. Erling Cruz on 11/25/2011. He was originally seen by Dr. Erling Cruz in November 2009 at which time he presented with right-sided stiffness and fine motor dyscontrol. He has a positive family history of Parkinson's disease in his father. He was initially started on Azilect and Mirapex was added in 2011. He started Sinemet 25-100 milligrams strength one pill 3 times a day and eventually stop the Azilect, due to side effects reported, including cognitive SEs.   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 He is a non-smoker, he drinks wine usually on weekends.    He noted a R hand tremor in the last 6 months. He has work related stress. He sleeps well generally speaking. Confined spaces are more difficulty to negotiate. He has mild memory issues. He has not fallen. He  did not do well without the Sinemet and has since then restarted it. He takes it 3 times a day along with one pill of Mirapex. He has not taken his  midday dose yet. He has no significant side effects except for mild sleepiness. He has never fallen asleep while driving. He has mild swelling in his feet at times especially on longer plane rides. He has to travel internationally maybe 3 times a year. His medication dose does last for 4 and sometimes 5 hours but at the end of the day especially if he's had a long work day he feels fatigued and more off. He goes to bed late. It is usually between midnight and 1 AM. He has a rise time of 7:30 AM. He snores when he sleeps on his back but does not have any gasping sensations are witnessed apneas as he recalls. His RLS symptoms are under control.      His Past Medical History Is Significant For: Past Medical History:  Diagnosis Date  . Arthritis   . Dyskinesia    occasionally   . Hx of colonic polyps 12/10/2010  . Leg pain left  . Parkinson's disease ([place])     His Past Surgical History Is Significant For: Past Surgical History:  Procedure Laterality Date  . COLONOSCOPY    . KNEE ARTHROSCOPY Right 01/2017   with Dr Alvan Dame ;at surgical center   . None listed    . TOTAL KNEE ARTHROPLASTY Right 09/28/2017   Procedure: RIGHT TOTAL KNEE ARTHROPLASTY;  Surgeon: Paralee Cancel, MD;  Location: [facility];  Service: Orthopedics;  Laterality: Right;  70 mins  . TOTAL KNEE ARTHROPLASTY Left 04/12/2018   Procedure: LEFT TOTAL KNEE ARTHROPLASTY;  Surgeon: Paralee Cancel, MD;  Location: [facility];  Service: Orthopedics;  Laterality: Left;  Adductor Block    His Family History Is Significant For: Family History  Problem Relation Age of Onset  . Heart disease Mother   . Diabetes Father   . Parkinson's disease Father   . Colon cancer Neg Hx   . Esophageal cancer Neg Hx   . Rectal cancer Neg Hx   . Stomach cancer Neg Hx     His Social History Is Significant For: Social History   Socioeconomic History  . Marital status: Married    Spouse name: Lanelle Bal  . Number of children: 3  . Years of education: Ph.D  .  Highest education level: Not on file  Occupational History    Employer: [employer]  Tobacco Use  . Smoking status: Never Smoker  . Smokeless tobacco: Never Used  Substance and Sexual Activity  . Alcohol use: Yes    Alcohol/week: 5.0 standard drinks    Types: 5 Glasses of wine per week    Comment: 2 bottles of wine on week-end.  . Drug use: No  . Sexual activity: Not on file  Other Topics Concern  . Not on file  Social History Narrative   Patient is right handed   Patient lives at home with his wife Lanelle Bal. 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   Caffeine- coffee on occasion    Patient has a Ph.D.         Social Determinants of Health   Financial Resource Strain:   . Difficulty of Paying Living Expenses:   Food Insecurity:   . Worried About Charity fundraiser in the Last Year:   . [employer]  of Food in the Last Year:   Transportation Needs:   . Film/video editor (Medical):   Marland Kitchen Lack of Transportation (Non-Medical):   Physical Activity:   . Days of Exercise per Week:   . Minutes of Exercise per Session:   Stress:   . Feeling of Stress :   Social Connections:   . Frequency of Communication with Friends and Family:   . Frequency of Social Gatherings with Friends and Family:   . Attends Religious Services:   . Active Member of Clubs or Organizations:   . Attends Archivist Meetings:   Marland Kitchen Marital Status:     His Allergies Are:  No Known Allergies:   His Current Medications Are:  Outpatient Encounter Medications as of 03/21/2020  Medication Sig  . baclofen (LIORESAL) 10 MG tablet Take 1 tablet (10 mg total) by mouth at bedtime as needed for muscle spasms.  . carbidopa-levodopa (SINEMET CR) 50-200 MG tablet Take 1 tablet by mouth at bedtime.  . carbidopa-levodopa (SINEMET IR) 25-100 MG tablet Take 1 tablet by mouth 5 (five) times daily. Take at 7 AM, 10 AM, 1 PM, 4 PM and 7 PM  . Cephalexin  (KEFLEX PO) Take by mouth.  . rasagiline (AZILECT) 1 MG TABS tablet Take 1 tablet (1 mg total) by mouth daily.  . rotigotine (NEUPRO) 4 MG/24HR Place 1 patch onto the skin daily.  . valACYclovir (VALTREX) 1000 MG tablet Take 1,000 mg by mouth as needed (cold sores).   . [DISCONTINUED] pramipexole (MIRAPEX) 1 MG tablet Take 1 tablet (1 mg total) by mouth at bedtime. (Patient not taking: Reported on 03/21/2020)   No facility-administered encounter medications on file as of 03/21/2020.  :  Review of Systems:  Out of a complete 14 point review of systems, all are reviewed and negative with the exception of these symptoms as listed below:  Review of Systems  Neurological:       Here for 3 month f/u reports a few falls since last visit no serious injury.  Reports the Neuropatch 4 mg has been helping.     Objective:  Neurological Exam  Physical Exam Physical Examination:   Vitals:   03/21/20 1354  BP: 132/80  Pulse: 69  Temp: 99.1 F (37.3 C)  SpO2: 96%    General Examination: The patient is a very pleasant 59 y.o. male in no acute distress. He appears well-developed and well-nourished and well groomed.   HEENT:Normocephalic, atraumatic, pupils are equal, round and reactive to light and accommodation. Wears corrective eyeglasses. Extraocular tracking shows mildto moderatesaccadic breakdown, noobviousnystagmus. He has a decreased eye blink rate andmoderatefacial masking,mildhypophonia, no dysarthria, oropharynx examination reveals mildmouth dryness, tongue protrudes centrally and palate elevates symmetrically. There is no drooling, no nasal drainage.No facial dyskinesias are noted.Well-healedscar from left DBS.   Chest:Clear to auscultation without wheezing, rhonchi or crackles noted.  Heart:S1+S2+0, regular and normal without murmurs, rubs or gallops noted.   Abdomen:Soft, non-tender and non-distended.  Extremities:There is1-2+edema in the distal lower  extremities bilaterally, Left more than right, chronic skin changes, but swelling slightly better.   Skin: Warm and dry without trophic changes noted, with chronic changes in distal legs.  Musculoskeletal: exam revealsmild bilateral knee swelling, right a little worse than left. Mild residual discomfort in both knees.  Neurologically:  Mental status: The patient is awake, alert and oriented in all 4 spheres. Hisimmediate and remote memory, attention, language skills and fund of knowledge are appropriate. There is no evidence of aphasia, agnosia,  apraxia or anomia. Speech is clear with normal prosody and enunciation. Thought process is linear. Mood is normaland affect is normal.  Cranial nerves II - XII are as described above under HEENT exam.  Motor exam: Normal bulk, and strength are noted, he hasfairly good range of motion,Mild to moderate resting tremor in the left upper extremity noted, no obvious dyskinesias, tone is increased in both upper extremities and mildly so in both lower extremities.  He has moderate impairment of his fine motor skills, more slowness, also in the moderate range.  He stands up with mild to moderate difficulty and his posture is stooped, he has slight difficulty with turns and mild start hesitation, walks with decreased arm swing left more than right. Sensory exam is intact to light touch. CerebellarTesting shows nodysmetria. No gait ataxia.  Assessment and plan:   In summary, Antonio [name] a very pleasant 59 year old malewithan underlying medical historyof arthritis with status post knee replacement surgeries, who presents for follow-up consultation of his right-sided predominant Parkinson's disease with diagnosis dating back to 2009 and symptoms dating back to about 2008. He is status postLDBS placement in 2019, generator placement in September and electrode placement in August 2019. Overall, he has done well, but his left-sided symptoms have become  worse.  His lower extremity swelling has become worse over time.  He was on low-dose Mirapex, and we tapered this off at in January 2021. He was started on Neupro patch 2 mg strength first and then increased it to 4 mg in March 2021. He is advised to continue with Sinemet 1 pill 5 times a day and Sinemet CR at bedtime as well as Azilect once daily in the morning.  He is advised to taper the Azilect off in preparation of his second DBS on the right side.  He is scheduled for DBS surgery in June.  We mutually agreed to keep his medication regimen stable.  He is advised to follow-up routinely in 6 months, sooner if needed. He is again cautioned against heavy lifting and we talked about the importance of fall prevention.  He also pursues his hobby of building with [employer] (which I also enjoy from time to time, and have done since my childhood); this is a favored activity he still enjoys pursuing with his daughter who is now 86.  Of note, he had right total knee replacement in 2018 and left total knee replacementin 2019. I answered all his questions today and he was in agreement. I spent 25 minutes in total face-to-face time and in reviewing records during pre-charting, more than 50% of which was spent in counseling and coordination of care, reviewing test results, reviewing medications and treatment regimen and/or in discussing or reviewing the diagnosis of PD, the prognosis and treatment options. Pertinent laboratory and imaging test results that were available during this visit with the patient were reviewed by me and considered in my medical decision making (see chart for details).

## 2020-03-21 NOTE — Patient Instructions (Addendum)
Please discontinue Azilect 1 mg strength by reducing to half pill x1 week and stop completely 1 or even 2 weeks before elective surgery.  Azilect has been reported in some case reports to interact with anesthesia pharmaceuticals as well as meperidine and narcotic pain medications. It can enhance adverse effects of hydrocodone, oxycodone, oxymorphone, hydromorphone, etc. You can resume the Azilect 3 days after surgery - so long as you are no longer on Narcotic pain medication - by starting half a pill once daily x 1 week and then 1 mg once daily thereafter. Generally speaking, patients with Parkinson's disease can have perioperative difficulty with motor fluctuations (ie, increase in tremor, slowness, stiffness), due to changes in their medication regimen, changes in the schedule altogether, and swallowing difficulty and be at higher risk for freezing, of times, balance issues and falls, and aspiration. Parkinson's patients can also be at higher risk of developing hallucinations secondary to pain medication, such as after elective surgery, requiring postoperative narcotic pain medication.  I wish you all the best for your upcoming 2nd DBS in June.   We will keep your meds stable.  You are up-to-date with all your prescriptions.  Let us plan for follow-up routinely in 6 months from now.

## 2020-04-20 ENCOUNTER — Other Ambulatory Visit: Payer: Self-pay | Admitting: Neurology

## 2020-07-17 ENCOUNTER — Ambulatory Visit (INDEPENDENT_AMBULATORY_CARE_PROVIDER_SITE_OTHER): Payer: BC Managed Care – PPO | Admitting: Family Medicine

## 2020-07-17 ENCOUNTER — Ambulatory Visit: Payer: Self-pay

## 2020-07-17 ENCOUNTER — Other Ambulatory Visit: Payer: Self-pay

## 2020-07-17 ENCOUNTER — Ambulatory Visit (INDEPENDENT_AMBULATORY_CARE_PROVIDER_SITE_OTHER): Payer: BC Managed Care – PPO

## 2020-07-17 ENCOUNTER — Encounter: Payer: Self-pay | Admitting: Family Medicine

## 2020-07-17 VITALS — BP 104/64 | HR 61 | Ht 74.0 in | Wt 244.0 lb

## 2020-07-17 DIAGNOSIS — M79672 Pain in left foot: Secondary | ICD-10-CM | POA: Diagnosis not present

## 2020-07-17 DIAGNOSIS — M25572 Pain in left ankle and joints of left foot: Secondary | ICD-10-CM | POA: Diagnosis not present

## 2020-07-17 MED ORDER — COLCHICINE 0.6 MG PO TABS: 0.6000 mg | ORAL_TABLET | Freq: Every day | ORAL | 2 refills | Status: DC | PRN

## 2020-07-17 NOTE — Progress Notes (Signed)
Subjective:    CC: L foot pain  I, Debbe Odea, am serving as a Neurosurgeon for Dr. Clementeen Graham.  HPI: Pt is a 59 y/o male presenting w/ c/o L foot pain.  He locates his pain to top, medial, and lateral foot.  Of note, pt has a hx of Parkinson's disease. Pain started 2 weeks ago. Describes pain as aching  He is dealing with bilateral lower extremity edema thought to be due to his Parkinson's disease medications.  This is currently being adjusted in hopes to reduce his edema.  He notes his left ankle is more swollen than his right and has become painful and causing a limp.  He does not recall any injury.  No personal history of gout.  Radiating pain: up the calf   L foot swelling: yes  Aggravating factors: stepping down certain ways  Treatments tried: Ice; heat; salonpas; ibuprofen   Pertinent review of Systems: No fevers or chills  Relevant historical information: Consistent disease early onset with bilateral deep brain stimulator.   Objective:    Vitals:   07/17/20 1438  BP: 104/64  Pulse: 61  SpO2: 97%   General: Well Developed, well nourished, and in no acute distress.   MSK: Bilateral lower extremities 2+ pitting edema. Left ankle edematous but also possible effusion. Tender palpation medial ankle. Decreased ankle motion.  Stable ligamentous exam.  Intact strength. Foot mildly edematous.  Nontender.  Pulses cap refill and sensation are intact distally.  Lab and Radiology Results  X-ray images left ankle and left foot obtained today personally and independently reviewed  Left ankle no acute fractures.  Soft tissue swelling.  No severe degenerative changes  Left foot: No acute fractures.  .Await formal radiology review  Diagnostic Limited MSK Ultrasound of: Left ankle Subcutaneous edema present. Small to moderate joint effusion present medial and lateral ankle. Normal-appearing peroneal tendon and posterior tibialis tendon structures. Impression: Ankle  effusion.  Significant edema    Impression and Recommendations:    Assessment and Plan: 59 y.o. male with left ankle pain and swelling.  Patient certainly does have edema however this is not new and has been a chronic issue.  The new issue is acute ankle pain and swelling for 2 weeks.  Ultrasound does show an ankle effusion.  Most likely diagnosis at this time is gout.  Plan for uric acid assessment and treatment with colchicine.  Also recommend Voltaren gel.  If not improving likely would proceed with oral steroid and cam walker boot.  Unfortunately due to deep brain stimulator patient is not compatible with MRI..   Orders Placed This Encounter  Procedures  . [place] LIMITED JOINT SPACE STRUCTURES LOW LEFT    Standing Status:   Future    Number of Occurrences:   1    Standing Expiration Date:   07/17/2021    Order Specific Question:   Reason for Exam (SYMPTOM  OR DIAGNOSIS REQUIRED)    Answer:   Left foot pain    Order Specific Question:   Preferred imaging location?    Answer:   [facility]  . DG Ankle Complete Left    Standing Status:   Future    Number of Occurrences:   1    Standing Expiration Date:   07/17/2021    Order Specific Question:   Reason for Exam (SYMPTOM  OR DIAGNOSIS REQUIRED)    Answer:   eval ankle pain    Order Specific Question:   Preferred  imaging location?    Answer:   [facility]    Order Specific Question:   Radiology Contrast Protocol - do NOT remove file path    Answer:   \\[email or web address]s.pdf  . DG Foot Complete Left    Standing Status:   Future    Number of Occurrences:   1    Standing Expiration Date:   07/17/2021    Order Specific Question:   Reason for Exam (SYMPTOM  OR DIAGNOSIS REQUIRED)    Answer:   eval foot pain    Order Specific Question:   Preferred imaging location?    Answer:   [facility]    Order Specific Question:   Radiology Contrast Protocol - do NOT remove  file path    Answer:   \\[email or web address]s.pdf  . Uric acid    Standing Status:   Future    Number of Occurrences:   1    Standing Expiration Date:   07/17/2021   Meds ordered this encounter  Medications  . colchicine 0.6 MG tablet    Sig: Take 1 tablet (0.6 mg total) by mouth daily as needed.    Dispense:  30 tablet    Refill:  2    Discussed warning signs or symptoms. Please see discharge instructions. Patient expresses understanding.   The above documentation has been reviewed and is accurate and complete Clementeen Graham, M.D.

## 2020-07-17 NOTE — Patient Instructions (Addendum)
Thank you for coming in today. Plan for xray today.  Get lab today.  Start colchicine.  Let me know if you do not improve.   I think this may be gout.    Gout  Gout is a condition that causes painful swelling of the joints. Gout is a type of inflammation of the joints (arthritis). This condition is caused by having too much uric acid in the body. Uric acid is a chemical that forms when the body breaks down substances called purines. Purines are important for building body proteins. When the body has too much uric acid, sharp crystals can form and build up inside the joints. This causes pain and swelling. Gout attacks can happen quickly and may be very painful (acute gout). Over time, the attacks can affect more joints and become more frequent (chronic gout). Gout can also cause uric acid to build up under the skin and inside the kidneys. What are the causes? This condition is caused by too much uric acid in your blood. This can happen because:  Your kidneys do not remove enough uric acid from your blood. This is the most common cause.  Your body makes too much uric acid. This can happen with some cancers and cancer treatments. It can also occur if your body is breaking down too many red blood cells (hemolytic anemia).  You eat too many foods that are high in purines. These foods include organ meats and some seafood. Alcohol, especially beer, is also high in purines. A gout attack may be triggered by trauma or stress. What increases the risk? You are more likely to develop this condition if you:  Have a family history of gout.  Are male and middle-aged.  Are male and have gone through menopause.  Are obese.  Frequently drink alcohol, especially beer.  Are dehydrated.  Lose weight too quickly.  Have an organ transplant.  Have lead poisoning.  Take certain medicines, including aspirin, cyclosporine, diuretics, levodopa, and niacin.  Have kidney disease.  Have a skin  condition called psoriasis. What are the signs or symptoms? An attack of acute gout happens quickly. It usually occurs in just one joint. The most common place is the big toe. Attacks often start at night. Other joints that may be affected include joints of the feet, ankle, knee, fingers, wrist, or elbow. Symptoms of this condition may include:  Severe pain.  Warmth.  Swelling.  Stiffness.  Tenderness. The affected joint may be very painful to touch.  Shiny, red, or purple skin.  Chills and fever. Chronic gout may cause symptoms more frequently. More joints may be involved. You may also have white or yellow lumps (tophi) on your hands or feet or in other areas near your joints. How is this diagnosed? This condition is diagnosed based on your symptoms, medical history, and physical exam. You may have tests, such as:  Blood tests to measure uric acid levels.  Removal of joint fluid with a thin needle (aspiration) to look for uric acid crystals.  X-rays to look for joint damage. How is this treated? Treatment for this condition has two phases: treating an acute attack and preventing future attacks. Acute gout treatment may include medicines to reduce pain and swelling, including:  NSAIDs.  Steroids. These are strong anti-inflammatory medicines that can be taken by mouth (orally) or injected into a joint.  Colchicine. This medicine relieves pain and swelling when it is taken soon after an attack. It can be given by mouth or  through an IV. Preventive treatment may include:  Daily use of smaller doses of NSAIDs or colchicine.  Use of a medicine that reduces uric acid levels in your blood.  Changes to your diet. You may need to see a dietitian about what to eat and drink to prevent gout. Follow these instructions at home: During a gout attack   If directed, put ice on the affected area: ? Put ice in a plastic bag. ? Place a towel between your skin and the bag. ? Leave the ice  on for 20 minutes, 2-3 times a day.  Raise (elevate) the affected joint above the level of your heart as often as possible.  Rest the joint as much as possible. If the affected joint is in your leg, you may be given crutches to use.  Follow instructions from your health care provider about eating or drinking restrictions. Avoiding future gout attacks  Follow a low-purine diet as told by your dietitian or health care provider. Avoid foods and drinks that are high in purines, including liver, kidney, anchovies, asparagus, herring, mushrooms, mussels, and beer.  Maintain a healthy weight or lose weight if you are overweight. If you want to lose weight, talk with your health care provider. It is important that you do not lose weight too quickly.  Start or maintain an exercise program as told by your health care provider. Eating and drinking  Drink enough fluids to keep your urine pale yellow.  If you drink alcohol: ? Limit how much you use to:  0-1 drink a day for women.  0-2 drinks a day for men. ? Be aware of how much alcohol is in your drink. In the [place] one drink equals one 12 oz bottle of beer (355 mL) one 5 oz glass of wine (148 mL), or one 1 oz glass of hard liquor (44 mL). General instructions  Take over-the-counter and prescription medicines only as told by your health care provider.  Do not drive or use heavy machinery while taking prescription pain medicine.  Return to your normal activities as told by your health care provider. Ask your health care provider what activities are safe for you.  Keep all follow-up visits as told by your health care provider. This is important. Contact a health care provider if you have:  Another gout attack.  Continuing symptoms of a gout attack after 10 days of treatment.  Side effects from your medicines.  Chills or a fever.  Burning pain when you urinate.  Pain in your lower back or belly. Get help right away if you:  Have  severe or uncontrolled pain.  Cannot urinate. Summary  Gout is painful swelling of the joints caused by inflammation.  The most common site of pain is the big toe, but it can affect other joints in the body.  Medicines and dietary changes can help to prevent and treat gout attacks. This information is not intended to replace advice given to you by your health care provider. Make sure you discuss any questions you have with your health care provider. Document Revised: 06/01/2018 Document Reviewed: 06/01/2018 Elsevier Patient Education  2020 [employer].

## 2020-07-18 LAB — URIC ACID: Uric Acid, Serum: 6.6 mg/dL (ref 4.0–8.0)

## 2020-07-18 NOTE — Progress Notes (Signed)
X-ray left ankle looks pretty normal to radiology with no fractures.

## 2020-07-18 NOTE — Progress Notes (Signed)
X-ray left foot looks pretty normal to radiology.

## 2020-07-18 NOTE — Progress Notes (Signed)
Uric acid is 6.6.  This is slightly higher than we would like for people with gout.  Target for gout is actually 6.0.  A uric acid result of 6.6 makes gout less likely for your current pain exacerbation.  I still think is reasonable to give colchicine a try and if not better would probably proceed with oral steroids.  If needed recommend using a cam walker boot like we discussed.  Please keep me updated.

## 2020-08-21 ENCOUNTER — Ambulatory Visit: Payer: BC Managed Care – PPO | Admitting: Neurology

## 2020-11-14 ENCOUNTER — Other Ambulatory Visit: Payer: Self-pay | Admitting: Neurology

## 2020-11-14 DIAGNOSIS — G20 Parkinson's disease: Secondary | ICD-10-CM

## 2020-11-30 ENCOUNTER — Encounter: Payer: Self-pay | Admitting: Neurology

## 2020-12-02 ENCOUNTER — Telehealth: Payer: Self-pay | Admitting: Neurology

## 2020-12-02 MED ORDER — PRAMIPEXOLE DIHYDROCHLORIDE 1 MG PO TABS: 1.0000 mg | ORAL_TABLET | Freq: Every day | ORAL | 3 refills | Status: DC

## 2020-12-02 NOTE — Telephone Encounter (Signed)
Please see MyChart message for reference. I will email him back.  I renewed pramipexole 1 mg strength, 1 pill at bedtime, 90 pills with 3 refills.  Sent it to his retail pharmacy on file.  He discontinued Neupro so I took it off his list.

## 2020-12-25 ENCOUNTER — Other Ambulatory Visit: Payer: Self-pay | Admitting: Family Medicine

## 2020-12-26 NOTE — Telephone Encounter (Signed)
Please advise 

## 2021-02-25 ENCOUNTER — Other Ambulatory Visit: Payer: Self-pay | Admitting: Neurology

## 2021-03-01 ENCOUNTER — Other Ambulatory Visit: Payer: Self-pay | Admitting: Neurology

## 2021-03-03 ENCOUNTER — Ambulatory Visit: Payer: 59 | Admitting: Neurology

## 2021-03-03 ENCOUNTER — Other Ambulatory Visit: Payer: Self-pay

## 2021-03-03 ENCOUNTER — Encounter: Payer: Self-pay | Admitting: Neurology

## 2021-03-03 VITALS — BP 108/62 | HR 55 | Ht 74.0 in | Wt 252.0 lb

## 2021-03-03 DIAGNOSIS — G4752 REM sleep behavior disorder: Secondary | ICD-10-CM

## 2021-03-03 DIAGNOSIS — R419 Unspecified symptoms and signs involving cognitive functions and awareness: Secondary | ICD-10-CM | POA: Diagnosis not present

## 2021-03-03 DIAGNOSIS — Z9689 Presence of other specified functional implants: Secondary | ICD-10-CM | POA: Diagnosis not present

## 2021-03-03 DIAGNOSIS — G20 Parkinson's disease: Secondary | ICD-10-CM | POA: Diagnosis not present

## 2021-03-03 DIAGNOSIS — Z9181 History of falling: Secondary | ICD-10-CM

## 2021-03-03 DIAGNOSIS — R2689 Other abnormalities of gait and mobility: Secondary | ICD-10-CM | POA: Diagnosis not present

## 2021-03-03 MED ORDER — RASAGILINE MESYLATE 1 MG PO TABS: 1.0000 mg | ORAL_TABLET | Freq: Every day | ORAL | 3 refills | Status: DC

## 2021-03-03 NOTE — Progress Notes (Signed)
Subjective:    Patient ID: Antonio [name] is a 60 y.o. male.  HPI     Interim history:    Antonio [name] is a very pleasant 60 year old right-handed gentleman with an underlying benign medical history, who presents for follow-up consultation of his right-sided predominant Parkinson's disease, s/p L, then R DBS, and his restless leg syndrome. The patient is unaccompanied today. I last saw him on 03/21/20, at which time he reported that his Neupro patch was helpful, he had residual restless leg symptoms at night.  His left hand symptoms particularly his tremor has become worse and he had a consultation regarding his second DBS with Dr. Linus Mako and Dr. Arlan Organ.  He had interim right-sided DBS on 05/02/2020.  He had a neurosurgery postop visit on 06/12/2020 and a subsequent appointment with Dr. Linus Mako on 07/17/2020.  He had a follow-up in programming appointment on 11/06/2020 with Dr. Linus Mako, I reviewed prior notes.  He emailed in the interim on 11/30/2020 requesting a refill on his pramipexole prescription, he was taking 1 mg at bedtime for his restless leg syndrome.  He had stopped the Neupro patch about 2-1/2 months prior.  He reported this to his second DBS had done well and that he had been able to reduce his Sinemet.    Today, 03/03/2021: He reports having done well after his right DBS.  He has been off of levodopa with the exception of Sinemet CR generic at bedtime and he continues to take Mirapex at night.  He has had the occasional dream enactment behavior, it happens about once a month and he has started putting a body pillow at the edge of the mattress, he sleeps in a queen size bed, his wife sleeps in a separate bedroom.  He has fallen out of bed in the past.  He has also fallen recently, in the past 2 to 3 months he has noticed some decline in his balance.  He has been off his Azilect for the past 2 to 3 months and would like to get back on it.  He would like to consider physical therapy, he has  talked to a therapist recently.  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  He continues to work with [employer].     The patient's allergies, current medications, family history, past medical history, past social history, past surgical history and problem list were reviewed and updated as appropriate.    Previously (copied from previous notes for reference):      I saw him on 12/19/2019, at which time he reported that his balance was worse.  He had had some falls without injuries.  I suggested we taper him off of Mirapex and start him on Neupro patch.  We increased the patch in the interim in March to 4 mg once daily.    I saw him on 07/26/2019, at which time he felt some progression of his symptoms.  He had some leg cramping at night.  He felt his sleep was interrupted, he had more daytime somnolence.  He had reduced his Mirapex to 1 pill once daily for leg swelling.  He did increase it back up as the leg swelling stayed the same.  He had also reduced his Sinemet to 1 pill 4 times a day.  I suggested we proceed with a sleep study.  He was also advised to increase his Sinemet to 1 pill 5 times a day, keep his Sinemet CR at bedtime and Azilect once daily in the  morning. He had a baseline polysomnogram on 08/27/2019 which did not show any significant sleep disordered breathing, he had mild snoring.  He had no significant PLM's.  We called him with his test results at the time.    I saw him on 01/23/2019, at which time he reported doing well.  He had a good programming appointment in 2019 in December which helped him quite a bit.  He was active physically.  He had some residual knee discomfort.  He was taking Advil as needed for this.  He had reduced his Mirapex on his own by skipping a dose here and there.  He was taking his nighttime dose consistently because of his restless legs.  Since he had some lower extremity swelling I suggested he reduce his Mirapex to half a pill in the morning and midday and 1 pill  at night     I saw him on 07/20/2018, at which time he was recently status post left subthalamic DBS electrode placement on 07/14/2018, with generator placement pending for September 2019, on 08/22/2018. 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 He had prior right total knee replacement. I suggested we keep his PD meds stable.      I saw him on 01/20/2018, at which time he was fairly stable. He had stopped taking Lexapro. He had undergone right total knee replacement surgery recently and was looking at left total knee replacement. He had DBS consultation at [facility] and was supposed to see a neurosurgeon next. He had a recent fall. Thankfully he did not injure himself. I suggested we continue his medication regimen.        I saw him on 09/22/2017, at which time he reported a fall. His right knee was bothering him. He was scheduled for right total knee replacement on 09/28/2017. He was also scheduled for a DBS consultation at [facility].   I saw him on 06/22/2017, at which time he reported more off time. He felt he was more progressed in his symptoms. His wife reported that he had fallen several times. He had more recent stressors. His wife had noted more freezing. He was more irritable and frustrated easier. He had more issues with his right knee, most likely needing total knee replacement. I suggested we increase his Sinemet to 1 pill 5 times a day and keep his Mirapex 1 mg 3 times a day, Sinemet CR bedtime, and keep his Azilect the same. I suggested an opinion for DBS evaluation at [facility]. I also suggested he start taking Lexapro 5 mg strength.    I saw him on 02/16/2017, at which time he was on Sinemet 4 times a day and Sinemet CR at bedtime. He had to have interim right knee surgery after an injury. He was still on hydrocodone as needed for pain. 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 Lower extremity swelling was about the same, memory stable, no  recent falls. No side effects from his medication. He was off of Azilect for his surgery and was able to restarted without problems after about 2 weeks. I asked him to continue with Mirapex 3 times a day, Sinemet CR bedtime, Sinemet IR 4 times a day and Azilect once daily.   I saw him on 05/12/2016, at which time he was reporting right leg pain and thigh pain. This seemed to be worse in between Sinemet doses. He was on Sinemet 3 times a day but was not always  keeping a set schedule for it. He was also taking the CR Sinemet at night more consistently. I suggested low-dose baclofen as needed 10 mg strength for his right leg muscle tension and pain. I suggested we continue with Azilect and Mirapex at the current doses but increase his Sinemet to 1 pill 4 times a day at 7, 11, 3 PM and 7 PM. He was also encouraged to take the CR at night.     I saw him on 01/13/16, at which time he reported feeling fairly stable. He did not continue with the CR at night as he did not notice much in the way of difference. He denied any recent restless leg symptoms. He did feel Mirapex was helpful for that. He was taking Sinemet 3 times a day and Mirapex 3 times a day, Azilect 1 mg once daily, which was generic. He reported fall about a week prior to his last appointment. He scraped his knee.he does not always drink enough water. He had some right-sided sciatica symptoms and radiating pain to the back of his thigh.  I ordered an MRI L spine wo contrast, which he had on 01/22/16:  IMPRESSION:  This MRI of the lumbar spine without contrast shows the following: 1.    At L3-L4, there is a midline disc protrusion and mild facet hypertrophy combining to cause borderline spinal stenosis and moderate left lateral recess stenosis there does not appear to be nerve root compression though there is some encroachment upon the traversing left L4 nerve root. 2.   There are milder degenerative changes at L2-L3, L4-L5 and L5-S1 with less potential  for nerve root impingement. 3.   There are no acute findings.   We called him with the test results.    I saw him on 09/09/2015, at which time he reported that the addition of Azilect was helpful. I suggested he continue with Sinemet tid, Mirapex 1 mg tid, Azilect once daily and try C/L CR at night.   I saw him on 04/24/2015, at which time the patient reported worsening balance and increase in his tremors. Unfortunately, he had fallen recently and fell backwards as he stood up from a chair and fell onto his back after which she felt sore for a couple of days but thankfully did not injure himself seriously. He did not have any head injury or loss of consciousness. He was working 2 jobs. He was not drinking enough water. He was not sleeping as well at night. Memory was stable. He denied significant depressive symptoms. His restless legs symptoms were under control. I suggested she continue with Sinemet and Mirapex, but I asked him to restart Azilect which he had tried in the past. We talked about potentially pursuing DBS surgery in the future.   I first met him on 12/25/2014, at which time he reported improved parkinsonian symptoms with Mirapex and Sinemet. I asked him to continue his medications, Mirapex 1 mg strength one pill 3 times a day and Sinemet 25-100 milligrams strength one pill 3 times a day. I suggested he could add a half pill of Sinemet as needed in the evenings.    He previously followed with Dr. Jim Like was last seen by him on 07/17/2014, at which time the patient reported having stopped Sinemet and he was using it as needed only. His Mirapex was increased to 1 mg 3 times a day as he also reported residual restless leg symptoms. The addition of Azilect was discussed.  He talked about  DBS for future consideration.   Prior to that he was followed by Dr. Morene Antu and was last seen by Dr. Erling Cruz on 11/25/2011. He was originally seen by Dr. Erling Cruz in November 2009 at which time he presented  with right-sided stiffness and fine motor dyscontrol. He has a positive family history of Parkinson's disease in his father. He was initially started on Azilect and Mirapex was added in 2011. He started Sinemet 25-100 milligrams strength one pill 3 times a day and eventually stop the Azilect, due to side effects reported, including cognitive SEs.   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 He is a non-smoker, he drinks wine usually on weekends.    He noted a R hand tremor in the last 6 months. He has work related stress. He sleeps well generally speaking. Confined spaces are more difficulty to negotiate. He has mild memory issues. He has not fallen. He did not do well without the Sinemet and has since then restarted it. He takes it 3 times a day along with one pill of Mirapex. He has not taken his midday dose yet. He has no significant side effects except for mild sleepiness. He has never fallen asleep while driving. He has mild swelling in his feet at times especially on longer plane rides. He has to travel internationally maybe 3 times a year. His medication dose does last for 4 and sometimes 5 hours but at the end of the day especially if he's had a long work day he feels fatigued and more off. He goes to bed late. It is usually between midnight and 1 AM. He has a rise time of 7:30 AM. He snores when he sleeps on his back but does not have any gasping sensations are witnessed apneas as he recalls. His RLS symptoms are under control.     His Past Medical History Is Significant For: Past Medical History:  Diagnosis Date  . Arthritis   . Dyskinesia    occasionally   . Hx of colonic polyps 12/10/2010  . Leg pain left  . Parkinson's disease ([place])     His Past Surgical History Is Significant For: Past Surgical History:  Procedure Laterality Date  . COLONOSCOPY    . KNEE ARTHROSCOPY Right 01/2017   with Dr Alvan Dame ;at surgical center   . None listed    . TOTAL  KNEE ARTHROPLASTY Right 09/28/2017   Procedure: RIGHT TOTAL KNEE ARTHROPLASTY;  Surgeon: Paralee Cancel, MD;  Location: [facility];  Service: Orthopedics;  Laterality: Right;  70 mins  . TOTAL KNEE ARTHROPLASTY Left 04/12/2018   Procedure: LEFT TOTAL KNEE ARTHROPLASTY;  Surgeon: Paralee Cancel, MD;  Location: [facility];  Service: Orthopedics;  Laterality: Left;  Adductor Block    His Family History Is Significant For: Family History  Problem Relation Age of Onset  . Heart disease Mother   . Diabetes Father   . Parkinson's disease Father   . Colon cancer Neg Hx   . Esophageal cancer Neg Hx   . Rectal cancer Neg Hx   . Stomach cancer Neg Hx     His Social History Is Significant For: Social History   Socioeconomic History  . Marital status: Married    Spouse name: Lanelle Bal  . Number of children: 3  . Years of education: Ph.D  . Highest education level: Not on file  Occupational History    Employer: [employer]  Tobacco Use  . Smoking status:  Never Smoker  . Smokeless tobacco: Never Used  Vaping Use  . Vaping Use: Never used  Substance and Sexual Activity  . Alcohol use: Yes    Alcohol/week: 5.0 standard drinks    Types: 5 Glasses of wine per week    Comment: 2 bottles of wine on week-end.  . Drug use: No  . Sexual activity: Not on file  Other Topics Concern  . Not on file  Social History Narrative   Patient is right handed   Patient lives at home with his wife Lanelle Bal. 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   Caffeine- coffee on occasion    Patient has a Ph.D.         Social Determinants of Health   Financial Resource Strain: Not on file  Food Insecurity: Not on file  Transportation Needs: Not on file  Physical Activity: Not on file  Stress: Not on file  Social Connections: Not on file    His Allergies Are:  No Known Allergies:   His Current Medications Are:  Outpatient Encounter Medications as of 03/03/2021   Medication Sig  . baclofen (LIORESAL) 10 MG tablet Take 1 tablet (10 mg total) by mouth at bedtime as needed for muscle spasms.  . carbidopa-levodopa (SINEMET CR) 50-200 MG tablet Take 1 tablet by mouth at bedtime.  . colchicine 0.6 MG tablet TAKE 1 TABLET (0.6 MG TOTAL) BY MOUTH DAILY AS NEEDED.  . melatonin 5 MG TABS Take by mouth.  . Multiple Vitamin (QUINTABS) TABS Take 1 tablet by mouth daily.  . valACYclovir (VALTREX) 1000 MG tablet Take 1,000 mg by mouth as needed (cold sores).   . carbidopa-levodopa (SINEMET IR) 25-100 MG tablet Take 1 tablet by mouth 5 (five) times daily. Take at 7 AM, 10 AM, 1 PM, 4 PM and 7 PM (Patient not taking: Reported on 03/03/2021)  . pramipexole (MIRAPEX) 1 MG tablet Take 1 tablet (1 mg total) by mouth at bedtime.  . rasagiline (AZILECT) 1 MG TABS tablet Take 1 tablet (1 mg total) by mouth daily. (Patient not taking: Reported on 03/03/2021)   No facility-administered encounter medications on file as of 03/03/2021.  :  Review of Systems:  Out of a complete 14 point review of systems, all are reviewed and negative with the exception of these symptoms as listed below: Review of Systems  Neurological:       Here for 6 month f/u. Report DBS procedure was completed in June of 2021 on the right side. Did well was able to decrease some of his P/D meds- reports over the last 3-4 weeks a decrease in balance has been noted a decrease in mobility. Reports 3 falls over the last 3-4 weeks. No serious injuries.     Objective:  Neurological Exam  Physical Exam Physical Examination:   Vitals:   03/03/21 0938  BP: 108/62  Pulse: (!) 55    General Examination: The patient is a very pleasant 60 y.o. male in no acute distress. He appears well-developed and well-nourished and well groomed.   HEENT:Normocephalic, atraumatic, pupils are equal, round and reactive to light, corrective eyeglasses in place.  Tracking is mild to moderately impaired.  No obvious nystagmus  seen. He has a decreased eye blink rate andmoderatefacial masking,mildhypophonia, no dysarthria, oropharynx examination reveals mildmouth dryness, tongue protrudes centrally and palate elevates symmetrically. There is no drooling, no nasal drainage.No facial dyskinesias are noted.Well-healedscars from left and right  DBS.   Chest:Clear to auscultation without wheezing, rhonchi or crackles noted.  Heart:S1+S2+0, regular and normal without murmurs, rubs or gallops noted.   Abdomen:Soft, non-tender and non-distended.  Extremities:There is1+edema in the distal lower extremities bilaterally.   Skin: Warm and dry without trophic changes noted, with chronic changes in distal legs.  Musculoskeletal: exam revealsmild bilateral knee swelling, right a little worse than left. Mild residual discomfort in both knees.  Neurologically:  Mental status: The patient is awake, alert and oriented in all 4 spheres. Hisimmediate and remote memory, attention, language skills and fund of knowledge are appropriate. There is no evidence of aphasia, agnosia, apraxia or anomia. Speech is clear with normal prosody and enunciation. Thought process is linear. Mood is normaland affect is normal.  Cranial nerves II - XII are as described above under HEENT exam.  Motor exam: Normal bulk, and strength are noted, he hasfairly good range of motion,no obvious resting tremor today, only intermittent in the right upper extremity, no obvious dyskinesias, tone is increased in the right upper extremity, fairly normal in the left upper extremity.  He has moderate impairment of his fine motor skills, right more so than left, slowness is in the mild to moderate range. He stands up with mild to moderate difficulty and his posture is stooped, appears to be better than last time, walks with decreased arm swing bilaterally, better than last time. Sensory exam is intact to light touch. CerebellarTesting shows  nodysmetria. No gait ataxia.  Assessment and plan:   In summary, TARRENCE ENCK a very pleasant3 year old malewithan underlying medical historyof arthritis with status post knee replacement surgeries, who presents for follow-up consultation of his right-sided predominant Parkinson's disease with diagnosis dating back to 2009 and symptoms dating back to about 2008. He is status postLDBS placementin 2019, generator placement in September and electrode placement in August 2019.He is now status post right DBS in June 2021, which was quite successful.  He has noticed recent issues with his balance.  He is currently off of generic Sinemet immediate release, off of Azilect and off of Neupro.  He continues to take Mirapex at bedtime for his restless leg syndrome and Sinemet CR at bedtime.  We mutually agreed to restart his Azilect, he will start with half a pill once daily for the first week or 2 and then go back to the full pill daily.  We can also consider introducing just a little bit of levodopa again but we mutually agreed to wait things especially after his programming appointment which is due in June, he will see Colan Neptune, NP at [facility].  He is advised to follow-up routinely in this clinic in about 6 months, sooner if needed.  He is good about getting in touch via [facility] email as well. Of note, he had right total knee replacement in 2018 and left total knee replacementin 2019.  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  This will probably improve stress level.  I made a referral to physical therapy, he would like to see somebody he had talked to before, he will give [place] the name and details when he has the chance to look it up.  I provided a new prescription for Azilect and written instructions. I answered all his questions today and he was in agreement.  I spent 35 minutes in total face-to-face time and in reviewing records during pre-charting, more than 50% of which was spent in  counseling and coordination of care, reviewing test  results, reviewing medications and treatment regimen and/or in discussing or reviewing the diagnosis of PD, the prognosis and treatment options. Pertinent laboratory and imaging test results that were available during this visit with the patient were reviewed by me and considered in my medical decision making (see chart for details).

## 2021-03-03 NOTE — Patient Instructions (Addendum)
It was good to see you again today.  I am happy to hear that your second DBS placement went well.  As discussed, we will reinitiate Azilect, please start with half a pill once daily for the first week or 2 and then you can increase back to 1 pill once daily thereafter.  Your prescription will reflect the full dose for ease of filling it.  Please continue to maintain a healthy lifestyle, focus on good hydration, good nutrition, exercising regularly and getting enough sleep.  I will make a referral to home health physical therapy as per your request.  Please give [place] details as to how you would like to see for your physical therapy as you have talked with a physical therapist before.  We can send the request to them directly.  If need be, we can reinitiate your Sinemet prescription and put you on a low dose.  We can await your programming results when you see Rexene Agent, NP at [facility] in June of this year.  Please follow-up routinely in 6 months.

## 2021-06-23 ENCOUNTER — Ambulatory Visit
Admission: RE | Admit: 2021-06-23 | Discharge: 2021-06-23 | Disposition: A | Discharge status: Home / Self Care | Payer: BC Managed Care – PPO | Source: Ambulatory Visit | Attending: Family Medicine | Admitting: Family Medicine

## 2021-06-23 ENCOUNTER — Other Ambulatory Visit: Payer: Self-pay | Admitting: Family Medicine

## 2021-06-23 ENCOUNTER — Other Ambulatory Visit: Payer: Self-pay

## 2021-06-23 DIAGNOSIS — M25511 Pain in right shoulder: Secondary | ICD-10-CM

## 2021-06-23 DIAGNOSIS — M5412 Radiculopathy, cervical region: Secondary | ICD-10-CM

## 2021-06-23 NOTE — Progress Notes (Signed)
Cervical spine x-ray

## 2021-07-15 ENCOUNTER — Other Ambulatory Visit: Payer: Self-pay | Admitting: Orthopedic Surgery

## 2021-07-15 DIAGNOSIS — M542 Cervicalgia: Secondary | ICD-10-CM

## 2021-08-01 ENCOUNTER — Ambulatory Visit
Admission: RE | Admit: 2021-08-01 | Discharge: 2021-08-01 | Disposition: A | Discharge status: Home / Self Care | Payer: 59 | Source: Ambulatory Visit | Attending: Orthopedic Surgery | Admitting: Orthopedic Surgery

## 2021-08-01 DIAGNOSIS — M542 Cervicalgia: Secondary | ICD-10-CM

## 2021-08-29 ENCOUNTER — Other Ambulatory Visit: Payer: Self-pay | Admitting: Family Medicine

## 2021-08-29 DIAGNOSIS — M5412 Radiculopathy, cervical region: Secondary | ICD-10-CM

## 2021-08-29 DIAGNOSIS — E041 Nontoxic single thyroid nodule: Secondary | ICD-10-CM

## 2021-08-29 NOTE — Progress Notes (Signed)
Pt managed by [facility] for Cervical radiculopathy and scheduled for steroid injection 10/18.  See CT scan for detail of pathology.

## 2021-09-02 ENCOUNTER — Ambulatory Visit: Payer: 59 | Admitting: Neurology

## 2021-09-02 ENCOUNTER — Encounter: Payer: Self-pay | Admitting: Neurology

## 2021-09-02 VITALS — BP 114/69 | HR 90 | Ht 73.0 in | Wt 244.2 lb

## 2021-09-02 DIAGNOSIS — Z9181 History of falling: Secondary | ICD-10-CM

## 2021-09-02 DIAGNOSIS — R2689 Other abnormalities of gait and mobility: Secondary | ICD-10-CM | POA: Diagnosis not present

## 2021-09-02 DIAGNOSIS — G479 Sleep disorder, unspecified: Secondary | ICD-10-CM

## 2021-09-02 DIAGNOSIS — Z9689 Presence of other specified functional implants: Secondary | ICD-10-CM

## 2021-09-02 DIAGNOSIS — G20 Parkinson's disease: Secondary | ICD-10-CM | POA: Diagnosis not present

## 2021-09-02 DIAGNOSIS — R419 Unspecified symptoms and signs involving cognitive functions and awareness: Secondary | ICD-10-CM | POA: Diagnosis not present

## 2021-09-02 DIAGNOSIS — G20A1 Parkinson's disease without dyskinesia, without mention of fluctuations: Secondary | ICD-10-CM

## 2021-09-02 MED ORDER — CARBIDOPA-LEVODOPA ER 50-200 MG PO TBCR: 1.0000 | EXTENDED_RELEASE_TABLET | Freq: Three times a day (TID) | ORAL | 3 refills | Status: DC

## 2021-09-02 NOTE — Progress Notes (Signed)
Subjective:    Patient ID: Antonio [name] is a 60 y.o. male.  HPI    Interim history:     Mr. Antonio [name] is a very pleasant 60 year old right-handed gentleman with an underlying benign medical history, who presents for follow-up consultation of his right-sided predominant Parkinson's disease, s/p L, then R DBS, and his restless leg syndrome. The patient is unaccompanied today. I last saw him on 03/03/2021, at which time he reported doing fairly well after his right sided DBS.  He was on Sinemet CR at bedtime and was on Mirapex at the time.  We restarted Azilect at that time.  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  We talked about initiating physical therapy.  He had an interim appointment with Elbert Ewings, NP at [facility] in April 2022.  Today, 09/02/2021: He reports having a harder time with his balance, he just feels off balance from time to time, he has fallen about 5 times in the past 6 months since our last visit.  He has not had any serious injuries thankfully but has bruised himself and has skinned his elbows.  He has had increase in stress, has not sold his 1 year yet, it is up for sale and he has lost a couple of staff members, as they moved to different places, this includes his daughter who moved to [place] and another staff member that moved to [place].  He is trying to hire more staff, he also has work-related stress at his job at [employer].  He got a reminder to make a follow-up appointment with Dr. Linus Mako.  He has had some word finding difficulty, wife has noted that he is having trouble remembering things.  He tries to hydrate well with water, physical activity is fairly good, he uses a recumbent bike, does yard work and works in the [place] about 3 to 4 days out of the week in the evenings and weekends, usually around 6 hours. He is currently not taking any Sinemet IR on a day-to-day basis, takes the CR at bedtime.  He is on Mirapex 1 mg at bedtime particularly for his restless  legs, he takes Azilect once daily.  He may have had a mild adjustment in his programming when he last saw the nurse practitioner at [facility].   He has done physical therapy which helped.  He developed neck pain in the past few months, has seen Dr. Gladstone Lighter for this.  That he had a x-ray of the neck and  a CT scan, the CT scan revealed a thyroid nodule and he supposed to have an ultrasound tomorrow for this.  He is also scheduled next week to have his neck epidural injection for the first time to see if it alleviates the neck pain, he has had radiating pain into the right shoulder and arm including stinging pain and pins-and-needles type sensations. I reviewed his cervical spine CT from 08/01/2021: IMPRESSION: 1. Multilevel degenerative changes of the cervical spine as described. 2. Moderate central canal stenosis at C4-5 with moderate foraminal narrowing, right greater than left. 3. Moderate left and mild right foraminal narrowing at C5-6. 4. Moderate right foraminal narrowing and mild left foraminal narrowing at C6-7. 5. 19 mm hypodense nodule anteriorly in the right lobe of the thyroid. Recommend thyroid US (ref: J Am Coll Radiol. 2015 Feb;12(2): 143-50).  X-ray of the cervical spine from 06/23/2021 showed: IMPRESSION: Multilevel degenerative disc disease, most severe at C4-C5.   Multilevel mild facet arthrosis.  He  has trouble getting to sleep.  He has been taking melatonin, currently at 10 mg at night.    The patient's allergies, current medications, family history, past medical history, past social history, past surgical history and problem list were reviewed and updated as appropriate.    Previously (copied from previous notes for reference):    I saw him on 03/21/20, at which time he reported that his Neupro patch was helpful, he had residual restless leg symptoms at night.  His left hand symptoms particularly his tremor has become worse and he had a consultation regarding his second DBS  with Dr. Linus Mako and Dr. Arlan Organ.  He had interim right-sided DBS on 05/02/2020.    He had a neurosurgery postop visit on 06/12/2020 and a subsequent appointment with Dr. Linus Mako on 07/17/2020.  He had a follow-up in programming appointment on 11/06/2020 with Dr. Linus Mako, I reviewed prior notes.   He emailed in the interim on 11/30/2020 requesting a refill on his pramipexole prescription, he was taking 1 mg at bedtime for his restless leg syndrome.  He had stopped the Neupro patch about 2-1/2 months prior.  He reported this to his second DBS had done well and that he had been able to reduce his Sinemet.       I saw him on 12/19/2019, at which time he reported that his balance was worse.  He had had some falls without injuries.  I suggested we taper him off of Mirapex and start him on Neupro patch.  We increased the patch in the interim in March to 4 mg once daily.    I saw him on 07/26/2019, at which time he felt some progression of his symptoms.  He had some leg cramping at night.  He felt his sleep was interrupted, he had more daytime somnolence.  He had reduced his Mirapex to 1 pill once daily for leg swelling.  He did increase it back up as the leg swelling stayed the same.  He had also reduced his Sinemet to 1 pill 4 times a day.  I suggested we proceed with a sleep study.  He was also advised to increase his Sinemet to 1 pill 5 times a day, keep his Sinemet CR at bedtime and Azilect once daily in the morning. He had a baseline polysomnogram on 08/27/2019 which did not show any significant sleep disordered breathing, he had mild snoring.  He had no significant PLM's.  We called him with his test results at the time.    I saw him on 01/23/2019, at which time he reported doing well.  He had a good programming appointment in 2019 in December which helped him quite a bit.  He was active physically.  He had some residual knee discomfort.  He was taking Advil as needed for this.  He had reduced his Mirapex on his  own by skipping a dose here and there.  He was taking his nighttime dose consistently because of his restless legs.  Since he had some lower extremity swelling I suggested he reduce his Mirapex to half a pill in the morning and midday and 1 pill at night     I saw him on 07/20/2018, at which time he was recently status post left subthalamic DBS electrode placement on 07/14/2018, with generator placement pending for September 2019, on 08/22/2018. 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 He had prior right total knee replacement. I suggested we keep his PD meds stable.  I saw him on 01/20/2018, at which time he was fairly stable. He had stopped taking Lexapro. He had undergone right total knee replacement surgery recently and was looking at left total knee replacement. He had DBS consultation at [facility] and was supposed to see a neurosurgeon next. He had a recent fall. Thankfully he did not injure himself. I suggested we continue his medication regimen.        I saw him on 09/22/2017, at which time he reported a fall. His right knee was bothering him. He was scheduled for right total knee replacement on 09/28/2017. He was also scheduled for a DBS consultation at [facility].   I saw him on 06/22/2017, at which time he reported more off time. He felt he was more progressed in his symptoms. His wife reported that he had fallen several times. He had more recent stressors. His wife had noted more freezing. He was more irritable and frustrated easier. He had more issues with his right knee, most likely needing total knee replacement. I suggested we increase his Sinemet to 1 pill 5 times a day and keep his Mirapex 1 mg 3 times a day, Sinemet CR bedtime, and keep his Azilect the same. I suggested an opinion for DBS evaluation at [facility]. I also suggested he start taking Lexapro 5 mg strength.    I saw him on 02/16/2017, at which time he was on Sinemet 4 times a day and Sinemet CR at  bedtime. He had to have interim right knee surgery after an injury. He was still on hydrocodone as needed for pain. 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 Lower extremity swelling was about the same, memory stable, no recent falls. No side effects from his medication. He was off of Azilect for his surgery and was able to restarted without problems after about 2 weeks. I asked him to continue with Mirapex 3 times a day, Sinemet CR bedtime, Sinemet IR 4 times a day and Azilect once daily.   I saw him on 05/12/2016, at which time he was reporting right leg pain and thigh pain. This seemed to be worse in between Sinemet doses. He was on Sinemet 3 times a day but was not always keeping a set schedule for it. He was also taking the CR Sinemet at night more consistently. I suggested low-dose baclofen as needed 10 mg strength for his right leg muscle tension and pain. I suggested we continue with Azilect and Mirapex at the current doses but increase his Sinemet to 1 pill 4 times a day at 7, 11, 3 PM and 7 PM. He was also encouraged to take the CR at night.     I saw him on 01/13/16, at which time he reported feeling fairly stable. He did not continue with the CR at night as he did not notice much in the way of difference. He denied any recent restless leg symptoms. He did feel Mirapex was helpful for that. He was taking Sinemet 3 times a day and Mirapex 3 times a day, Azilect 1 mg once daily, which was generic. He reported fall about a week prior to his last appointment. He scraped his knee.he does not always drink enough water. He had some right-sided sciatica symptoms and radiating pain to the back of his thigh.  I ordered an MRI L spine wo contrast, which he had on 01/22/16:  IMPRESSION:  This MRI of the lumbar spine without contrast shows the  following: 1.    At L3-L4, there is a midline disc protrusion and mild facet hypertrophy combining to cause borderline spinal stenosis and moderate  left lateral recess stenosis there does not appear to be nerve root compression though there is some encroachment upon the traversing left L4 nerve root. 2.   There are milder degenerative changes at L2-L3, L4-L5 and L5-S1 with less potential for nerve root impingement. 3.   There are no acute findings.   We called him with the test results.    I saw him on 09/09/2015, at which time he reported that the addition of Azilect was helpful. I suggested he continue with Sinemet tid, Mirapex 1 mg tid, Azilect once daily and try C/L CR at night.   I saw him on 04/24/2015, at which time the patient reported worsening balance and increase in his tremors. Unfortunately, he had fallen recently and fell backwards as he stood up from a chair and fell onto his back after which she felt sore for a couple of days but thankfully did not injure himself seriously. He did not have any head injury or loss of consciousness. He was working 2 jobs. He was not drinking enough water. He was not sleeping as well at night. Memory was stable. He denied significant depressive symptoms. His restless legs symptoms were under control. I suggested she continue with Sinemet and Mirapex, but I asked him to restart Azilect which he had tried in the past. We talked about potentially pursuing DBS surgery in the future.   I first met him on 12/25/2014, at which time he reported improved parkinsonian symptoms with Mirapex and Sinemet. I asked him to continue his medications, Mirapex 1 mg strength one pill 3 times a day and Sinemet 25-100 milligrams strength one pill 3 times a day. I suggested he could add a half pill of Sinemet as needed in the evenings.    He previously followed with Dr. Jim Like was last seen by him on 07/17/2014, at which time the patient reported having stopped Sinemet and he was using it as needed only. His Mirapex was increased to 1 mg 3 times a day as he also reported residual restless leg symptoms. The addition of  Azilect was discussed.  He talked about DBS for future consideration.   Prior to that he was followed by Dr. Morene Antu and was last seen by Dr. Erling Cruz on 11/25/2011. He was originally seen by Dr. Erling Cruz in November 2009 at which time he presented with right-sided stiffness and fine motor dyscontrol. He has a positive family history of Parkinson's disease in his father. He was initially started on Azilect and Mirapex was added in 2011. He started Sinemet 25-100 milligrams strength one pill 3 times a day and eventually stop the Azilect, due to side effects reported, including cognitive SEs.   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 He is a non-smoker, he drinks wine usually on weekends.    He noted a R hand tremor in the last 6 months. He has work related stress. He sleeps well generally speaking. Confined spaces are more difficulty to negotiate. He has mild memory issues. He has not fallen. He did not do well without the Sinemet and has since then restarted it. He takes it 3 times a day along with one pill of Mirapex. He has not taken his midday dose yet. He has no significant side effects except for mild sleepiness. He  has never fallen asleep while driving. He has mild swelling in his feet at times especially on longer plane rides. He has to travel internationally maybe 3 times a year. His medication dose does last for 4 and sometimes 5 hours but at the end of the day especially if he's had a long work day he feels fatigued and more off. He goes to bed late. It is usually between midnight and 1 AM. He has a rise time of 7:30 AM. He snores when he sleeps on his back but does not have any gasping sensations are witnessed apneas as he recalls. His RLS symptoms are under control.     His Past Medical History Is Significant For: Past Medical History:  Diagnosis Date   Arthritis    Dyskinesia    occasionally    Hx of colonic polyps 12/10/2010   Leg pain left    Parkinson's disease ([place])     His Past Surgical History Is Significant For: Past Surgical History:  Procedure Laterality Date   COLONOSCOPY     KNEE ARTHROSCOPY Right 01/2017   with Dr Alvan Dame ;at surgical center    None listed     TOTAL KNEE ARTHROPLASTY Right 09/28/2017   Procedure: RIGHT TOTAL KNEE ARTHROPLASTY;  Surgeon: Paralee Cancel, MD;  Location: [facility];  Service: Orthopedics;  Laterality: Right;  70 mins   TOTAL KNEE ARTHROPLASTY Left 04/12/2018   Procedure: LEFT TOTAL KNEE ARTHROPLASTY;  Surgeon: Paralee Cancel, MD;  Location: [facility];  Service: Orthopedics;  Laterality: Left;  Adductor Block    His Family History Is Significant For: Family History  Problem Relation Age of Onset   Heart disease Mother    Diabetes Father    Parkinson's disease Father    Colon cancer Neg Hx    Esophageal cancer Neg Hx    Rectal cancer Neg Hx    Stomach cancer Neg Hx     His Social History Is Significant For: Social History   Socioeconomic History   Marital status: Married    Spouse name: Lanelle Bal   Number of children: 3   Years of education: Ph.D   Highest education level: Not on file  Occupational History    Employer: [employer]  Tobacco Use   Smoking status: Never   Smokeless tobacco: Never  Vaping Use   Vaping Use: Never used  Substance and Sexual Activity   Alcohol use: Yes    Alcohol/week: 3.0 standard drinks    Types: 3 Glasses of wine per week    Comment: 2 bottles of wine on week-end.   Drug use: No   Sexual activity: Not on file  Other Topics Concern   Not on file  Social History Narrative   Patient is right handed   Patient lives at home with his wife Lanelle Bal. 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   Caffeine- coffee on occasion    Patient has a Ph.D.         Social Determinants of Health   Financial Resource Strain: Not on file  Food Insecurity: Not on file  Transportation Needs: Not on file   Physical Activity: Not on file  Stress: Not on file  Social Connections: Not on file    His Allergies Are:  No Known Allergies:   His Current Medications Are:  Outpatient Encounter Medications as of 09/02/2021  Medication Sig   baclofen (LIORESAL) 10 MG tablet Take 1 tablet (10  mg total) by mouth at bedtime as needed for muscle spasms.   carbidopa-levodopa (SINEMET CR) 50-200 MG tablet TAKE 1 TABLET BY MOUTH EVERYDAY AT BEDTIME   carbidopa-levodopa (SINEMET IR) 25-100 MG tablet Take 1 tablet by mouth 5 (five) times daily. Take at 7 AM, 10 AM, 1 PM, 4 PM and 7 PM   colchicine 0.6 MG tablet TAKE 1 TABLET (0.6 MG TOTAL) BY MOUTH DAILY AS NEEDED.   melatonin 5 MG TABS Take by mouth.   methocarbamol (ROBAXIN) 500 MG tablet Take 500 mg by mouth as needed for muscle spasms.   Multiple Vitamin (QUINTABS) TABS Take 1 tablet by mouth daily.   pramipexole (MIRAPEX) 1 MG tablet Take 1 tablet (1 mg total) by mouth at bedtime.   predniSONE (DELTASONE) 10 MG tablet Take 10 mg by mouth in the morning, at noon, in the evening, and at bedtime.   rasagiline (AZILECT) 1 MG TABS tablet Take 1 tablet (1 mg total) by mouth daily.   valACYclovir (VALTREX) 1000 MG tablet Take 1,000 mg by mouth as needed (cold sores).    No facility-administered encounter medications on file as of 09/02/2021.  :  Review of Systems:  Out of a complete 14 point review of systems, all are reviewed and negative with the exception of these symptoms as listed below:  Review of Systems  Neurological:        Pt is here for follow up for parkinson's. Pt states it is progressing . Pt states he has a CT and xray . Pt states he has osteoarthritis  which is causing him to have pain in right arm , Pt states his gait has declined in the last 2-3 months since Physical therapy .   Objective:  Neurological Exam  Physical Exam Physical Examination:   Vitals:   09/02/21 0949  BP: 114/69  Pulse: 90   General Examination: The patient  is a very pleasant 60 y.o. male in no acute distress. He appears well-developed and well-nourished and well groomed.   HEENT: Normocephalic, atraumatic, pupils are equal, round and reactive to light, corrective eyeglasses in place.  Tracking is mild to moderately impaired.  No obvious nystagmus seen. He has a decreased eye blink rate and moderate  facial masking, mild hypophonia, no dysarthria, oropharynx examination reveals mild mouth dryness, tongue protrudes centrally and palate elevates symmetrically. There is no drooling, no nasal drainage. No facial dyskinesias are noted. Well-healed scars from left and right DBS.  Mild nuchal rigidity.  No significant neck pain currently.   Chest: Clear to auscultation without wheezing, rhonchi or crackles noted.   Heart: S1+S2+0, regular and normal without murmurs, rubs or gallops noted.    Abdomen: Soft, non-tender and non-distended.   Extremities: There is 1+ edema in the distal lower extremities bilaterally.    Skin: Warm and dry without trophic changes noted, with chronic changes in distal legs.   Musculoskeletal: exam reveals mild bilateral knee swelling, right a little worse than left. Mild residual discomfort in both knees.   Neurologically:  Mental status: The patient is awake, alert and oriented in all 4 spheres. His immediate and remote memory, attention, language skills and fund of knowledge are appropriate. There is no evidence of aphasia, agnosia, apraxia or anomia. Speech is clear with normal prosody and enunciation. Thought process is linear. Mood is normal and affect is normal.   Cranial nerves II - XII are as described above under HEENT exam.  Motor exam: Normal bulk, and strength are noted, he  has fairly good range of motion, no obvious resting tremor today, only intermittent in the right upper extremity, no obvious dyskinesias, tone is mildly increased in the right upper extremity, fairly normal in the left upper extremity.  He has mild  to moderate impairment of his fine motor skills, right more so than left, slowness is in the mild to moderate range. He stands up with mild to moderate difficulty and his posture is stooped, appears stable, decreased arm swing , having some trouble picking up his right foot, slight limp as well.   Sensory exam is intact to light touch. CerebellarTesting shows no dysmetria. No gait ataxia.   Assessment and plan:    In summary, ISIDORE MARGRAF is a very pleasant 60 year old male with an underlying medical history of arthritis with status post knee replacement surgeries, who presents for follow-up consultation of his right-sided predominant Parkinson's disease with diagnosis dating back to 2009 and symptoms dating back to about 2008. He is status post L DBS placement in 2019, generator placement in September and electrode placement in August 2019. He is now status post right DBS in June 2021, which was quite successful.  He has noticed recent issues with his balance.  He has had several falls.  He is currently off of generic Sinemet immediate release, and off of Neupro.  He continues to take Mirapex at bedtime for his restless leg syndrome and Sinemet CR at bedtime, we restarted his Azilect in April 2022.  He has had some more decline in his mobility, has been struggling with radiculopathy, has seen orthopedics for this.  He is scheduled for an epidural steroid injection into the cervical spine next week.  He was found to have a thyroid nodule for which he will have an ultrasound tomorrow.  He is advised to increase his Sinemet CR to 1 pill 3 times daily.  We can reintroduce immediate release levodopa in the near future if the need arises.  He is encouraged to make his follow-up appointment to see Dr. Linus Mako as well.  We will plan a follow-up in this clinic in about 4 months.  We talked about the importance of fall prevention, being proactive about constipation.  We will continue to monitor his cognitive  complaints.  He is quite active physically, we talked about the importance of stress reduction.  Unfortunately, he does have a significant amount of work and business related stress.  He is advised to call us with any interim questions or concerns and utilize through [employer] as needed. I answered all his questions today and he was in agreement.  I spent 40 minutes in total face-to-face time and in reviewing records during pre-charting, more than 50% of which was spent in counseling and coordination of care, reviewing test results, reviewing medications and treatment regimen and/or in discussing or reviewing the diagnosis of PD, the prognosis and treatment options. Pertinent laboratory and imaging test results that were available during this visit with the patient were reviewed by me and considered in my medical decision making (see chart for details).

## 2021-09-02 NOTE — Patient Instructions (Addendum)
It was nice to see you again today.  I am sorry you are having more trouble with your balance.  Please try to be extra careful when you first stand up and get your bearings first, we will try to reintroduce additional levodopa by increasing your long-acting Sinemet to 1 pill 3 times daily.  Try to take it at least an hour before you plan to eat any meal. We will continue with Azilect once daily and pramipexole once daily at bedtime. Only hope you have good results from your upcoming neck injection and benign results on your thyroid ultrasound which is also scheduled.  Please make an appointment to see Dr. Rubin Payor soon. Follow-up in his clinic in about 4 months, sooner if needed, stay in touch via MyChart messaging as needed.  For sleep, continue with melatonin, you can go up to 15 mg if need be.

## 2021-09-03 ENCOUNTER — Ambulatory Visit
Admission: RE | Admit: 2021-09-03 | Discharge: 2021-09-03 | Disposition: A | Discharge status: Home / Self Care | Payer: 59 | Source: Ambulatory Visit | Attending: Family Medicine | Admitting: Family Medicine

## 2021-09-03 DIAGNOSIS — E041 Nontoxic single thyroid nodule: Secondary | ICD-10-CM

## 2021-09-05 ENCOUNTER — Other Ambulatory Visit: Payer: Self-pay | Admitting: Family Medicine

## 2021-09-05 DIAGNOSIS — E041 Nontoxic single thyroid nodule: Secondary | ICD-10-CM

## 2021-09-11 ENCOUNTER — Ambulatory Visit: Payer: 59 | Admitting: Physical Therapy

## 2021-09-11 ENCOUNTER — Other Ambulatory Visit: Payer: Self-pay

## 2021-09-11 ENCOUNTER — Encounter: Payer: Self-pay | Admitting: Physical Therapy

## 2021-09-11 DIAGNOSIS — R2689 Other abnormalities of gait and mobility: Secondary | ICD-10-CM

## 2021-09-11 DIAGNOSIS — M5412 Radiculopathy, cervical region: Secondary | ICD-10-CM

## 2021-09-21 ENCOUNTER — Encounter: Payer: Self-pay | Admitting: Physical Therapy

## 2021-09-21 NOTE — Therapy (Signed)
[facility] [place] [facility] [address] [place], [place], [address] Phone: [phone]   Fax:  [phone]  Physical Therapy Evaluation  Patient Details  Name: Antonio [name] MRN: [id number] Date of Birth: February 18, 1961 Referring Provider (PT): Shelly Flatten   Encounter Date: 09/11/2021   PT End of Session - 09/21/21 0926     Visit Number 1    Number of Visits 16    Date for PT Re-Evaluation 11/06/21    Authorization Type UHC    PT Start Time 0930    PT Stop Time 1015    PT Time Calculation (min) 45 min    Activity Tolerance Patient tolerated treatment well    Behavior During Therapy [facility] for tasks assessed/performed             Past Medical History:  Diagnosis Date   Arthritis    Dyskinesia    occasionally    Hx of colonic polyps 12/10/2010   Leg pain left   Parkinson's disease [facility])     Past Surgical History:  Procedure Laterality Date   COLONOSCOPY     KNEE ARTHROSCOPY Right 01/2017   with Dr Charlann Boxer ;at surgical center    None listed     TOTAL KNEE ARTHROPLASTY Right 09/28/2017   Procedure: RIGHT TOTAL KNEE ARTHROPLASTY;  Surgeon: Durene Romans, MD;  Location: [facility];  Service: Orthopedics;  Laterality: Right;  70 mins   TOTAL KNEE ARTHROPLASTY Left 04/12/2018   Procedure: LEFT TOTAL KNEE ARTHROPLASTY;  Surgeon: Durene Romans, MD;  Location: [facility];  Service: Orthopedics;  Laterality: Left;  Adductor Block    There were no vitals filed for this visit.    Subjective Assessment - 09/21/21 0916     Subjective Pt x states neck pain since July. He notes extension is causing R arm tingling.  He did have epidural on Tuesday, has not noticed a difference yet.  He has shooting pains and tingling in R UE. Low cervial pain and into shoulder.  R handed.  Parkinsons for 14 yrs,  stimulators in brain, to improve tremors, works significantly well ([employer]). L 2019, R 2021.   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 Is able to do physical work, but has pain.   Parkins,  lives w wife and her father, lots of stairs, No AD,  Fall: has fallen: 6-8 times  Falling backwards,    He woud like to work on his balance. R LE gait deficits, R ankle tends to roll w side stepping.    Pertinent History Parkinsons, Bil TKA    Currently in Pain? Yes    Pain Score 9     Pain Location Neck    Pain Orientation Right    Pain Descriptors / Indicators Aching    Pain Type Acute pain    Pain Onset More than a month ago    Pain Frequency Intermittent    Aggravating Factors  cervical extension                [facility] PT Assessment - 09/21/21 0001       Assessment   Medical Diagnosis Neck pain/ radiculopahty, Balance/Gait    Referring Provider (PT) Shelly Flatten    Prior Therapy not for neck      Precautions   Precautions Fall      Balance Screen   Has the patient fallen in the past 6 months Yes    How many times? 3  Has the patient had a decrease in activity level because of a fear of falling?  No    Is the patient reluctant to leave their home because of a fear of falling?  No      Prior Function   Level of Independence Independent      Cognition   Overall Cognitive Status Within Functional Limits for tasks assessed      ROM / Strength   AROM / PROM / Strength AROM;Strength      AROM   AROM Assessment Site Cervical    Cervical Flexion WFL    Cervical Extension mod limitaiton/significant pain    Cervical - Right Side Bend mild deficit    Cervical - Left Side Bend mild deficit    Cervical - Right Rotation mild deficit    Cervical - Left Rotation mild deficit      Strength   Overall Strength Comments UE: 4+/5, Grip: 120 on L and R                        Objective measurements completed on examination: See above findings.       [facility] Adult PT Treatment/Exercise - 09/21/21 0001       Exercises   Exercises Neck      Neck Exercises: Stretches   Upper Trapezius Stretch 3  reps;30 seconds                     PT Education - 09/21/21 0925     Education Details PT POC, Exam findings,    Person(s) Educated Patient    Methods Explanation;Demonstration;Tactile cues;Verbal cues;Handout    Comprehension Verbalized understanding;Returned demonstration;Verbal cues required;Tactile cues required;Need further instruction              PT Short Term Goals - 09/21/21 0929       PT SHORT TERM GOAL #1   Title Pt to be independent wtih initial HEP    Time 2    Period Weeks    Status New    Target Date 09/25/21               PT Long Term Goals - 09/21/21 0933       PT LONG TERM GOAL #1   Title Pt to report decreased pain in neck and UE to 0-3/10    Time 8    Period Weeks    Status New    Target Date 10/23/21      PT LONG TERM GOAL #2   Title Pt to demo ability for at least 75 % of cervical ROM in each motion, to be painfree, to improve ability for ADLS and Driving.    Time 8    Period Weeks    Status New    Target Date 10/23/21      PT LONG TERM GOAL #3   Title Balance Goal TBD.                    Plan - 09/21/21 0945     Clinical Impression Statement Pt presents with primary complaint of significant pain in neck and tingling down R arm. He has decreased ROM in c-spine wiht increased pain,  mostly extension. He does have good strength preservation in UEs and grip. He also has balance and gait deficits, and has had several falls. He has good strength with testing in LEs, but decreased coordination and balance due to parkinsons. Balance to be  tested formally next visit and goals to be written then. Pt with poor clearance of R foot and LE with gait.    Personal Factors and Comorbidities Comorbidity 1    Comorbidities parkinsons    Examination-Activity Limitations Locomotion Level;Reach Overhead;Squat;Stairs    Examination-Participation Restrictions Cleaning;Meal Prep;Yard Work;Community Activity;Shop    Stability/Clinical  Decision Making Evolving/Moderate complexity    Clinical Decision Making Moderate    Rehab Potential Fair    PT Frequency 2x / week    PT Duration 8 weeks    PT Treatment/Interventions ADLs/Self Care Home Management;Cryotherapy;Moist Heat;Traction;DME Instruction;Balance training;Therapeutic exercise;Functional mobility training;Therapeutic activities;Stair training;Gait training;Neuromuscular re-education;Patient/family education;Vasopneumatic Device;Taping;Energy conservation;Dry needling;Passive range of motion;Spinal Manipulations;Joint Manipulations    Consulted and Agree with Plan of Care Patient             Patient will benefit from skilled therapeutic intervention in order to improve the following deficits and impairments:  Abnormal gait, Pain, Impaired sensation, Increased muscle spasms, Decreased mobility, Decreased activity tolerance, Decreased range of motion, Decreased strength, Impaired UE functional use, Difficulty walking, Decreased safety awareness, Decreased balance, Decreased coordination  Visit Diagnosis: Other abnormalities of gait and mobility  Radiculopathy, cervical region     Problem List Patient Active Problem List   Diagnosis Date Noted   Obese 04/13/2018   S/P left TKA 04/12/2018   Status post right knee replacement 09/28/2017   Parkinson disease (HCC) 06/23/2013   RLS (restless legs syndrome) 06/23/2013   Depression 06/23/2013   Hx of colonic polyps 12/10/2010    Sedalia Muta, PT, DPT 9:50 AM  09/21/21    [facility] [place] [facility] [address] [place], [place], [address] Phone: [phone]   Fax:  [phone]  Name: Antonio [name] MRN: [id number] Date of Birth: 1961/02/03

## 2021-09-24 ENCOUNTER — Ambulatory Visit: Payer: 59 | Admitting: Physical Therapy

## 2021-09-24 ENCOUNTER — Other Ambulatory Visit: Payer: Self-pay

## 2021-09-24 ENCOUNTER — Encounter: Payer: Self-pay | Admitting: Physical Therapy

## 2021-09-24 DIAGNOSIS — M5412 Radiculopathy, cervical region: Secondary | ICD-10-CM | POA: Diagnosis not present

## 2021-09-24 DIAGNOSIS — R2689 Other abnormalities of gait and mobility: Secondary | ICD-10-CM

## 2021-09-24 NOTE — Therapy (Addendum)
[place], [place], [address] Phone: [phone]   Fax:  [phone]  Physical Therapy Treatment  Patient Details  Name: Antonio [name] MRN: [id number] Date of Birth: Dec 27, 1960 Referring Provider (PT): Linna Darner   Encounter Date: 09/24/2021   PT End of Session - 09/24/21 1318     Visit Number 2    Number of Visits 16    Date for PT Re-Evaluation 11/06/21    Authorization Type UHC    PT Start Time 1220    PT Stop Time 7048    PT Time Calculation (min) 45 min    Equipment Utilized During Treatment Gait belt    Activity Tolerance Patient tolerated treatment well    Behavior During Therapy [facility] for tasks assessed/performed             Past Medical History:  Diagnosis Date   Arthritis    Dyskinesia    occasionally    Hx of colonic polyps 12/10/2010   Leg pain left   Parkinson's disease [facility])     Past Surgical History:  Procedure Laterality Date   COLONOSCOPY     KNEE ARTHROSCOPY Right 01/2017   with Dr Alvan Dame ;at surgical center    None listed     TOTAL KNEE ARTHROPLASTY Right 09/28/2017   Procedure: RIGHT TOTAL KNEE ARTHROPLASTY;  Surgeon: Paralee Cancel, MD;  [facility]: [facility];  Service: Orthopedics;  Laterality: Right;  70 mins   TOTAL KNEE ARTHROPLASTY Left 04/12/2018   Procedure: LEFT TOTAL KNEE ARTHROPLASTY;  Surgeon: Paralee Cancel, MD;  [facility];  Service: Orthopedics;  Laterality: Left;  Adductor Block    There were no vitals filed for this visit.   Subjective Assessment - 09/24/21 1222     Subjective Pt states no improvement in pain from injection. He is now talking celebrex which he thinks is helping some.    Currently in Pain? Yes    Pain Score 3     Pain Location Neck    Pain Orientation Right    Pain Descriptors / Indicators Aching    Pain Type Acute pain    Pain Onset More than a month ago    Pain Frequency Intermittent                [facility] PT Assessment -  09/24/21 0001       Standardized Balance Assessment   Standardized Balance Assessment Dynamic Gait Index      Dynamic Gait Index   Level Surface Mild Impairment    Change in Gait Speed Mild Impairment    Gait with Horizontal Head Turns Mild Impairment    Gait with Vertical Head Turns Mild Impairment    Gait and Pivot Turn Moderate Impairment    Step Over Obstacle Mild Impairment    Step Around Obstacles Mild Impairment    Steps Mild Impairment    Total Score 15                           [facility] Adult PT Treatment/Exercise - 09/24/21 0001       Exercises   Exercises Knee/Hip      Neck Exercises: Theraband   Rows 20 reps;Green      Neck Exercises: Stretches   Upper Trapezius Stretch 3 reps;30 seconds      Knee/Hip Exercises: Standing   Heel Raises 15 reps    Hip Flexion 15 reps    Other  Standing Knee Exercises Corner: L/R and A/P weight shifts x 20 ea, 1/2 tandem stance 20 sec x 2 bil;    Other Standing Knee Exercises Bwd walking 20 ft x 4;  Toe taps on 6 in step x 10 bil; no UE support (CGA)      Manual Therapy   Manual Therapy Joint mobilization;Soft tissue mobilization;Manual Traction    Joint Mobilization Cervical PA mobs    Soft tissue mobilization STM/TPR to R UT and levator,  STM to bil cervical parapinals,    Manual Traction cervical10 sec x 10;                     PT Education - 09/24/21 1316     Education Details HEP    Person(s) Educated Patient    Methods Explanation;Demonstration;Tactile cues;Verbal cues;Handout    Comprehension Verbalized understanding;Returned demonstration;Verbal cues required;Tactile cues required;Need further instruction              PT Short Term Goals - 09/21/21 0929       PT SHORT TERM GOAL #1   Title Pt to be independent wtih initial HEP    Time 2    Period Weeks    Status New    Target Date 09/25/21               PT Long Term Goals - 09/24/21 1318       PT LONG TERM GOAL #1    Title Pt to report decreased pain in neck and UE to 0-3/10    Time 8    Period Weeks    Status New    Target Date 10/23/21      PT LONG TERM GOAL #2   Title Pt to demo ability for at least 75 % of cervical ROM in each motion, to be painfree, to improve ability for ADLS and Driving.    Time 8    Period Weeks    Status New    Target Date 10/23/21      PT LONG TERM GOAL #3   Title Pt to demo dymamic balance to be [facility] for pt age and diagnosis,to improve safety with functional activity and fall risk.    Time 8    Period Weeks    Status New    Target Date 10/23/21      PT LONG TERM GOAL #4   Title Pt to demo improved score on DGI by at least 4 points.    Time 8    Period Weeks    Status New    Target Date 10/23/21                   Plan - 09/24/21 1325     Clinical Impression Statement Balance testing today, pt does have deficits with dynamic balance, with tendency for posterior lob, and poor foot clearance on R. Reviewed simple corner balance that he can start for HEP. He will benefit from progressive balance training to reduce fall risk. He has improved pain in neck and UE today from previous visit. Marland Kitchen He has increased muscle tension in R UT region today. Good tolerance for manual therapy. Pt to benefit from continued work on Neck and UE radicular pain, as well as balance.    Personal Factors and Comorbidities Comorbidity 1    Comorbidities parkinsons    Examination-Activity Limitations Locomotion Level;Reach Overhead;Squat;Stairs    Examination-Participation Restrictions Cleaning;Meal Prep;Yard Work;Community Activity;Shop    Stability/Clinical Decision Making Evolving/Moderate complexity  Rehab Potential Fair    PT Frequency 2x / week    PT Duration 8 weeks    PT Treatment/Interventions ADLs/Self Care Home Management;Cryotherapy;Moist Heat;Traction;DME Instruction;Balance training;Therapeutic exercise;Functional mobility training;Therapeutic activities;Stair  training;Gait training;Neuromuscular re-education;Patient/family education;Vasopneumatic Device;Taping;Energy conservation;Dry needling;Passive range of motion;Spinal Manipulations;Joint Manipulations    Consulted and Agree with Plan of Care Patient             Patient will benefit from skilled therapeutic intervention in order to improve the following deficits and impairments:  Abnormal gait, Pain, Impaired sensation, Increased muscle spasms, Decreased mobility, Decreased activity tolerance, Decreased range of motion, Decreased strength, Impaired UE functional use, Difficulty walking, Decreased safety awareness, Decreased balance, Decreased coordination  Visit Diagnosis: Radiculopathy, cervical region  Other abnormalities of gait and mobility     Problem List Patient Active Problem List   Diagnosis Date Noted   Obese 04/13/2018   S/P left TKA 04/12/2018   Status post right knee replacement 09/28/2017   Parkinson disease ([place]) 06/23/2013   RLS (restless legs syndrome) 06/23/2013   Depression 06/23/2013   Hx of colonic polyps 12/10/2010    Lyndee Hensen, PT, DPT 1:28 PM  09/24/21    [place] [address] [place], [place], [address] Phone: [phone]   Fax:  [phone]  Name: Antonio [name] MRN: [id number] Date of Birth: Jul 04, 1961  PHYSICAL THERAPY DISCHARGE SUMMARY  Visits from Start of Care: 2 Plan: Patient agrees to discharge.  Patient goals were not met. Patient is being discharged due to - not returning since last visit.      Lyndee Hensen, PT, DPT 12:53 PM  11/26/21

## 2021-09-24 NOTE — Patient Instructions (Signed)
Access Code: [email or web address] URL: [email or web address] Date: 09/24/2021 Prepared by: Sedalia Muta  Exercises Seated Upper Trapezius Stretch - 2 x daily - 3 reps - 30 hold Seated Scapular Retraction - 2 x daily - 1 sets - 10 reps Standing Romberg to 1/2 Tandem Stance - 1 x daily - 3 reps - 30 hold Side to Side Weight Shift with Unilateral Counter Support - 1 x daily - 2 sets - 10 reps Forward Backward Weight Shift with Counter Support - 1 x daily - 2 sets - 10 reps Standing March with Unilateral Counter Support - 1 x daily - 2 sets - 10 reps Heel Raises with Counter Support - 1 x daily - 2 sets - 10 reps

## 2021-10-01 ENCOUNTER — Other Ambulatory Visit: Payer: Self-pay

## 2021-10-01 ENCOUNTER — Ambulatory Visit: Payer: 59 | Admitting: Physical Therapy

## 2021-10-01 DIAGNOSIS — M5412 Radiculopathy, cervical region: Secondary | ICD-10-CM | POA: Diagnosis not present

## 2021-10-01 DIAGNOSIS — R2689 Other abnormalities of gait and mobility: Secondary | ICD-10-CM

## 2021-10-02 ENCOUNTER — Encounter: Payer: Self-pay | Admitting: Physical Therapy

## 2021-10-02 ENCOUNTER — Ambulatory Visit
Admission: RE | Admit: 2021-10-02 | Discharge: 2021-10-02 | Disposition: A | Discharge status: Home / Self Care | Payer: 59 | Source: Ambulatory Visit | Attending: Family Medicine | Admitting: Family Medicine

## 2021-10-02 DIAGNOSIS — E041 Nontoxic single thyroid nodule: Secondary | ICD-10-CM

## 2021-10-02 NOTE — Progress Notes (Addendum)
Patient presented for biopsy of right thyroid nodule.  After patient verification and consent, procedure was attempted.  Patient was unable to tolerate positioning required for thyroid biopsy, reporting excessive pain in the cervical spine and extending into the right upper extremity.  Procedure was aborted.  No specimens were collected.    Patient plans to reach out to physician regarding medication for pain and/or relaxation and then reschedule procedure appointment.  Electronically Signed: Sheliah Plane, PA 10/02/2021, 2:51 PM

## 2021-10-02 NOTE — Therapy (Signed)
[facility] [place] [facility] [address] [place], [place], [address] Phone: [phone]   Fax:  [phone]  Physical Therapy Treatment  Patient Details  Name: Antonio [name] MRN: [id number] Date of Birth: [birth year] Referring Provider (PT): Shelly Flatten   Encounter Date: 10/01/2021   PT End of Session - 10/02/21 1416     Visit Number 3    Number of Visits 16    Date for PT Re-Evaluation 11/06/21    Authorization Type UHC    PT Start Time 1111    PT Stop Time 1155    PT Time Calculation (min) 44 min    Equipment Utilized During Treatment Gait belt    Activity Tolerance Patient tolerated treatment well    Behavior During Therapy [facility] for tasks assessed/performed             Past Medical History:  Diagnosis Date   Arthritis    Dyskinesia    occasionally    Hx of colonic polyps 12/10/2010   Leg pain left   Parkinson's disease [facility])     Past Surgical History:  Procedure Laterality Date   COLONOSCOPY     KNEE ARTHROSCOPY Right 01/2017   with Dr Charlann Boxer ;at surgical center    None listed     TOTAL KNEE ARTHROPLASTY Right 09/28/2017   Procedure: RIGHT TOTAL KNEE ARTHROPLASTY;  Surgeon: Durene Romans, MD;  Location: [facility];  Service: Orthopedics;  Laterality: Right;  70 mins   TOTAL KNEE ARTHROPLASTY Left 04/12/2018   Procedure: LEFT TOTAL KNEE ARTHROPLASTY;  Surgeon: Durene Romans, MD;  [facility];  Service: Orthopedics;  Laterality: Left;  Adductor Block    There were no vitals filed for this visit.   Subjective Assessment - 10/02/21 1415     Subjective Pt still having significant pain and tingling in neck and UE.    Currently in Pain? Yes    Pain Score 5     Pain Location Neck    Pain Orientation Right    Pain Descriptors / Indicators Aching    Pain Type Acute pain    Pain Onset More than a month ago    Pain Frequency Intermittent                               [facility] Adult PT Treatment/Exercise -  10/02/21 0001       Neck Exercises: Theraband   Rows 20 reps;Blue      Neck Exercises: Stretches   Upper Trapezius Stretch 3 reps;30 seconds      Knee/Hip Exercises: Standing   Heel Raises 15 reps    Forward Step Up 10 reps;Both;Step Height: 6";Hand Hold: 1    Functional Squat 15 reps    Functional Squat Limitations at mat table    Other Standing Knee Exercises fwd, bwd, and diagonal stepping with weight shifts x 5 each bil;    Other Standing Knee Exercises Bwd walking 25 ft x 4;  Toe taps on 6 in step x 10 bil; no UE support (CGA)      Modalities   Modalities Traction      Traction   Type of Traction Cervical   tried, pt unable to tolerate positioning in machine, increased pain in UE.     Manual Therapy   Joint Mobilization Cervical PA mobs    Soft tissue mobilization STM/TPR to R UT and levator,  STM to bil cervical parapinals,  Manual Traction cervical10 sec x  4 min                       PT Short Term Goals - 09/21/21 0929       PT SHORT TERM GOAL #1   Title Pt to be independent wtih initial HEP    Time 2    Period Weeks    Status New    Target Date 09/25/21               PT Long Term Goals - 09/24/21 1318       PT LONG TERM GOAL #1   Title Pt to report decreased pain in neck and UE to 0-3/10    Time 8    Period Weeks    Status New    Target Date 10/23/21      PT LONG TERM GOAL #2   Title Pt to demo ability for at least 75 % of cervical ROM in each motion, to be painfree, to improve ability for ADLS and Driving.    Time 8    Period Weeks    Status New    Target Date 10/23/21      PT LONG TERM GOAL #3   Title Pt to demo dymamic balance to be [facility] for pt age and diagnosis,to improve safety with functional activity and fall risk.    Time 8    Period Weeks    Status New    Target Date 10/23/21      PT LONG TERM GOAL #4   Title Pt to demo improved score on DGI by at least 4 points.    Time 8    Period Weeks    Status New     Target Date 10/23/21                   Plan - 10/02/21 1418     Clinical Impression Statement Pt with continued pain and UE radicular pain. Attempts for cervical traction machine today, pt unable to tolerate positioning, so continued manual distraction instead. Education and practice for slight knee flexion and forward COG with standing activities. Pt with tendency for LOB posteriorly, improved with more forward/flexed stance. Pt challenged with ther ex and NMR  due to R LE coordination and balance. Plan to continue pain relief for neck, and progress LE strenght/balance/gait as able.    Personal Factors and Comorbidities Comorbidity 1    Comorbidities parkinsons    Examination-Activity Limitations Locomotion Level;Reach Overhead;Squat;Stairs    Examination-Participation Restrictions Cleaning;Meal Prep;Yard Work;Community Activity;Shop    Stability/Clinical Decision Making Evolving/Moderate complexity    Rehab Potential Fair    PT Frequency 2x / week    PT Duration 8 weeks    PT Treatment/Interventions ADLs/Self Care Home Management;Cryotherapy;Moist Heat;Traction;DME Instruction;Balance training;Therapeutic exercise;Functional mobility training;Therapeutic activities;Stair training;Gait training;Neuromuscular re-education;Patient/family education;Vasopneumatic Device;Taping;Energy conservation;Dry needling;Passive range of motion;Spinal Manipulations;Joint Manipulations    Consulted and Agree with Plan of Care Patient             Patient will benefit from skilled therapeutic intervention in order to improve the following deficits and impairments:  Abnormal gait, Pain, Impaired sensation, Increased muscle spasms, Decreased mobility, Decreased activity tolerance, Decreased range of motion, Decreased strength, Impaired UE functional use, Difficulty walking, Decreased safety awareness, Decreased balance, Decreased coordination  Visit Diagnosis: Radiculopathy, cervical region  Other  abnormalities of gait and mobility     Problem List Patient Active Problem List   Diagnosis Date  Noted   Obese 04/13/2018   S/P left TKA 04/12/2018   Status post right knee replacement 09/28/2017   Parkinson disease (HCC) 06/23/2013   RLS (restless legs syndrome) 06/23/2013   Depression 06/23/2013   Hx of colonic polyps 12/10/2010   Sedalia Muta, PT, DPT 2:21 PM  10/02/21    [place] [facility] [address] [place], [place], [address] Phone: [phone]   Fax:  [phone]  Name: MITCHELLE GOERNER MRN: [id number] Date of Birth: [birth year]

## 2021-10-08 ENCOUNTER — Encounter: Payer: 59 | Admitting: Physical Therapy

## 2021-10-08 ENCOUNTER — Telehealth: Payer: Self-pay | Admitting: Neurology

## 2021-10-08 NOTE — Telephone Encounter (Signed)
Pt's PCP Dr. Margot Ables called wanting to advise about a possible medication to prescribe for the pt. Pt was scheduled to do a Thyroid Biopsy but was unable to tolerate the position during the procedure. Dr. Margot Ables is requesting a call back [phone](cell) and [phone](clinic).

## 2021-10-08 NOTE — Telephone Encounter (Signed)
I called and spoke to Dr. Margot Ables.  I relayed Dr. Teofilo Pod message regarding that a small amount of Xanax would be no  problem with his Parkinson's disease.  He stated that he was what he needed to know he appreciated call back.

## 2021-10-08 NOTE — Telephone Encounter (Signed)
Please call PCP office back.  If they are wanting to prescribe a small dose of Xanax, it should be okay, and should not interfere with his Parkinson's medication.

## 2021-10-15 ENCOUNTER — Encounter: Payer: 59 | Admitting: Physical Therapy

## 2021-10-22 ENCOUNTER — Encounter: Payer: 59 | Admitting: Physical Therapy

## 2021-11-04 ENCOUNTER — Other Ambulatory Visit (HOSPITAL_COMMUNITY)
Admission: RE | Admit: 2021-11-04 | Discharge: 2021-11-04 | Disposition: A | Discharge status: Home / Self Care | Payer: 59 | Source: Ambulatory Visit | Attending: Family Medicine | Admitting: Family Medicine

## 2021-11-04 ENCOUNTER — Ambulatory Visit
Admission: RE | Admit: 2021-11-04 | Discharge: 2021-11-04 | Disposition: A | Discharge status: Home / Self Care | Payer: 59 | Source: Ambulatory Visit | Attending: Family Medicine | Admitting: Family Medicine

## 2021-11-04 DIAGNOSIS — E041 Nontoxic single thyroid nodule: Secondary | ICD-10-CM | POA: Insufficient documentation

## 2021-11-05 LAB — CYTOLOGY - NON PAP

## 2021-11-20 ENCOUNTER — Encounter (HOSPITAL_COMMUNITY): Payer: Self-pay

## 2021-11-24 ENCOUNTER — Other Ambulatory Visit: Payer: Self-pay | Admitting: Neurology

## 2022-01-04 ENCOUNTER — Encounter: Payer: Self-pay | Admitting: Internal Medicine

## 2022-01-07 ENCOUNTER — Ambulatory Visit: Payer: 59 | Admitting: Neurology

## 2022-02-17 ENCOUNTER — Ambulatory Visit: Payer: 59 | Admitting: Neurology

## 2022-02-25 ENCOUNTER — Other Ambulatory Visit: Payer: Self-pay | Admitting: Neurology

## 2022-02-25 DIAGNOSIS — G20 Parkinson's disease: Secondary | ICD-10-CM

## 2022-03-27 ENCOUNTER — Other Ambulatory Visit: Payer: Self-pay | Admitting: Family Medicine

## 2022-03-27 DIAGNOSIS — E041 Nontoxic single thyroid nodule: Secondary | ICD-10-CM

## 2022-03-27 NOTE — Progress Notes (Signed)
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 Pt aware of the potential risks of repeat procedure as well as possible benign vs malignant nature of thyroid nodule. Pt wanting to proceed w/ repeat FNA.  ?Orders placed.  ?

## 2022-04-09 ENCOUNTER — Other Ambulatory Visit (HOSPITAL_COMMUNITY)
Admission: RE | Admit: 2022-04-09 | Discharge: 2022-04-09 | Disposition: A | Discharge status: Home / Self Care | Payer: 59 | Source: Ambulatory Visit | Attending: Family Medicine | Admitting: Family Medicine

## 2022-04-09 ENCOUNTER — Ambulatory Visit
Admission: RE | Admit: 2022-04-09 | Discharge: 2022-04-09 | Disposition: A | Discharge status: Home / Self Care | Payer: 59 | Source: Ambulatory Visit | Attending: Family Medicine | Admitting: Family Medicine

## 2022-04-09 DIAGNOSIS — E041 Nontoxic single thyroid nodule: Secondary | ICD-10-CM | POA: Diagnosis not present

## 2022-04-11 LAB — CYTOLOGY - NON PAP

## 2022-08-18 ENCOUNTER — Other Ambulatory Visit: Payer: Self-pay | Admitting: Family Medicine

## 2022-08-18 DIAGNOSIS — M549 Dorsalgia, unspecified: Secondary | ICD-10-CM

## 2022-08-18 NOTE — Progress Notes (Signed)
Several week h/o R upper back to shoulder blade pain. Deep. Comes and goes. Worse w/ certain movemetns and sitting for prolonged times.

## 2022-08-28 ENCOUNTER — Ambulatory Visit
Admission: RE | Admit: 2022-08-28 | Discharge: 2022-08-28 | Disposition: A | Discharge status: Home / Self Care | Payer: 59 | Source: Ambulatory Visit | Attending: Family Medicine | Admitting: Family Medicine

## 2022-08-28 ENCOUNTER — Other Ambulatory Visit: Payer: Self-pay | Admitting: Neurology

## 2022-08-28 DIAGNOSIS — G20A1 Parkinson's disease without dyskinesia, without mention of fluctuations: Secondary | ICD-10-CM

## 2022-08-28 DIAGNOSIS — M549 Dorsalgia, unspecified: Secondary | ICD-10-CM

## 2022-12-04 ENCOUNTER — Other Ambulatory Visit: Payer: Self-pay | Admitting: Neurology

## 2022-12-04 DIAGNOSIS — G20A1 Parkinson's disease without dyskinesia, without mention of fluctuations: Secondary | ICD-10-CM

## 2023-03-04 ENCOUNTER — Other Ambulatory Visit: Payer: Self-pay | Admitting: Neurology

## 2023-03-04 DIAGNOSIS — G20A1 Parkinson's disease without dyskinesia, without mention of fluctuations: Secondary | ICD-10-CM

## 2023-03-07 ENCOUNTER — Other Ambulatory Visit: Payer: Self-pay | Admitting: Neurology

## 2023-04-10 ENCOUNTER — Other Ambulatory Visit: Payer: Self-pay | Admitting: Neurology

## 2023-04-20 ENCOUNTER — Other Ambulatory Visit: Payer: Self-pay | Admitting: Family Medicine

## 2023-04-20 DIAGNOSIS — I4891 Unspecified atrial fibrillation: Secondary | ICD-10-CM

## 2023-04-20 NOTE — Progress Notes (Signed)
28mo h/o new onset fatigue, ow stamina and dizzy episodes. Afib on ECG. VSS.  Echo ordered and pt referral placed for Afib Clinic.

## 2023-04-28 ENCOUNTER — Ambulatory Visit (HOSPITAL_COMMUNITY)
Admission: RE | Admit: 2023-04-28 | Discharge: 2023-04-28 | Disposition: A | Discharge status: Home / Self Care | Payer: 59 | Source: Ambulatory Visit | Attending: Internal Medicine | Admitting: Internal Medicine

## 2023-04-28 ENCOUNTER — Encounter (HOSPITAL_COMMUNITY): Payer: Self-pay | Admitting: Internal Medicine

## 2023-04-28 VITALS — BP 140/82 | HR 93 | Ht 73.0 in | Wt 248.7 lb

## 2023-04-28 DIAGNOSIS — I4819 Other persistent atrial fibrillation: Secondary | ICD-10-CM | POA: Insufficient documentation

## 2023-04-28 DIAGNOSIS — Z7901 Long term (current) use of anticoagulants: Secondary | ICD-10-CM | POA: Diagnosis not present

## 2023-04-28 NOTE — Progress Notes (Addendum)
Primary Care Physician: Ozella Rocks, MD Primary Cardiologist: None Primary Electrophysiologist: None Referring Physician: Dr. Gardiner Ramus Vanegas is a 62 y.o. male with a history of Parkinson's disease with deep brain stimulator placed and atrial fibrillation who presents for consultation in the [facility]. The patient was initially diagnosed with atrial fibrillation on 04/20/23 after presenting to PCP with symptoms of 3 months of feeling off and loss of stamina. ECG copy showed rate controlled Afib with HR 77. He was found to be in rate controlled Afib. Patient is on Eliquis 5 mg BID for a CHADS2VASC score of zero.  On evaluation today, he is in rate controlled Afib. He feels fatigued when in Afib. He has noted this symptom over the past 2-3 months. He started 2 full doses of Eliquis 5 mg BID on Thursday 5/30. He can do daily activities for the most part without issue. He has a deep brain stimulator located in upper left chest with 2 electrodes placed in his brain (right electrode ~2.5 years ago, left ~3.5 years ago). His neurologist is Dr. Rubin Payor at [facility] last seen March 2024. He had a normal sleep study done 5-6 years ago.  He is compliant with anticoagulation and has not missed any doses. He has no bleeding concerns.  Today, he denies symptoms of palpitations, chest pain, shortness of breath, orthopnea, PND, lower extremity edema, dizziness, presyncope, syncope, snoring, daytime somnolence, bleeding, or neurologic sequela. The patient is tolerating medications without difficulties and is otherwise without complaint today.    he has a BMI of Body mass index is 32.81 kg/m.Marland Kitchen Filed Weights   04/28/23 1452  Weight: 112.8 kg    Family History  Problem Relation Age of Onset   Heart disease Mother    Diabetes Father    Parkinson's disease Father    Colon cancer Neg Hx    Esophageal cancer Neg Hx    Rectal cancer Neg Hx    Stomach cancer Neg  Hx     Atrial Fibrillation Management history:  Previous antiarrhythmic drugs: None Previous cardioversions: None Previous ablations: None Anticoagulation history: Eliquis 5 mg BID   Past Medical History:  Diagnosis Date   Arthritis    Dyskinesia    occasionally    Hx of colonic polyps 12/10/2010   Leg pain left   Parkinson's disease    Past Surgical History:  Procedure Laterality Date   COLONOSCOPY     KNEE ARTHROSCOPY Right 01/2017   with Dr Charlann Boxer ;at surgical center    None listed     TOTAL KNEE ARTHROPLASTY Right 09/28/2017   Procedure: RIGHT TOTAL KNEE ARTHROPLASTY;  Surgeon: Durene Romans, MD;  Location: [facility];  Service: Orthopedics;  Laterality: Right;  70 mins   TOTAL KNEE ARTHROPLASTY Left 04/12/2018   Procedure: LEFT TOTAL KNEE ARTHROPLASTY;  Surgeon: Durene Romans, MD;  Location: [facility];  Service: Orthopedics;  Laterality: Left;  Adductor Block    Current Outpatient Medications  Medication Sig Dispense Refill   carbidopa-levodopa (SINEMET CR) 50-200 MG tablet TAKE 1 TABLET BY MOUTH THREE TIMES A DAY 90 tablet 0   carbidopa-levodopa (SINEMET IR) 25-100 MG tablet Take 1 tablet by mouth 5 (five) times daily. Take at 7 AM, 10 AM, 1 PM, 4 PM and 7 PM 450 tablet 3   ELIQUIS 5 MG TABS tablet 5 mg 2 (two) times daily.     melatonin 5 MG TABS Take by mouth.  Multiple Vitamin (QUINTABS) TABS Take 1 tablet by mouth daily.     pramipexole (MIRAPEX) 1 MG tablet TAKE 1 TABLET BY MOUTH EVERYDAY AT BEDTIME 30 tablet 0   rasagiline (AZILECT) 1 MG TABS tablet TAKE 1 TABLET BY MOUTH EVERY DAY 90 tablet 0   valACYclovir (VALTREX) 1000 MG tablet Take 1,000 mg by mouth as needed (cold sores).      baclofen (LIORESAL) 10 MG tablet Take 1 tablet (10 mg total) by mouth at bedtime as needed for muscle spasms. (Patient not taking: Reported on 04/28/2023) 90 each 3   colchicine 0.6 MG tablet TAKE 1 TABLET (0.6 MG TOTAL) BY MOUTH DAILY AS NEEDED. (Patient not taking: Reported on 04/28/2023) 30  tablet 2   methocarbamol (ROBAXIN) 500 MG tablet Take 500 mg by mouth as needed for muscle spasms. (Patient not taking: Reported on 04/28/2023)     predniSONE (DELTASONE) 10 MG tablet Take 10 mg by mouth in the morning, at noon, in the evening, and at bedtime. (Patient not taking: Reported on 04/28/2023)     No current facility-administered medications for this encounter.    No Known Allergies  Social History   Socioeconomic History   Marital status: Married    Spouse name: Dorene Grebe   Number of children: 3   Years of education: Ph.D   Highest education level: Not on file  Occupational History    Employer: [employer]  Tobacco Use   Smoking status: Never   Smokeless tobacco: Never  Vaping Use   Vaping Use: Never used  Substance and Sexual Activity   Alcohol use: Yes    Alcohol/week: 3.0 standard drinks of alcohol    Types: 3 Glasses of wine per week    Comment: 2 bottles of wine on week-end.   Drug use: No   Sexual activity: Not on file  Other Topics Concern   Not on file  Social History Narrative   Patient is right handed   Patient lives at home with his wife Dorene Grebe. 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   Caffeine- coffee on occasion    Patient has a Ph.D.         Social Determinants of Health   Financial Resource Strain: Not on file  Food Insecurity: Not on file  Transportation Needs: Not on file  Physical Activity: Not on file  Stress: Not on file  Social Connections: Not on file  Intimate Partner Violence: Not on file    ROS- All systems are reviewed and negative except as per the HPI above.  Physical Exam: Vitals:   04/28/23 1452  BP: (!) 140/82  Pulse: 93  Weight: 112.8 kg  Height: 6\' 1"  (1.854 m)    GEN- The patient is a well appearing male, alert and oriented x 3 today.   Head- normocephalic, atraumatic Eyes-  Sclera clear, conjunctiva pink Ears- hearing intact Oropharynx- clear Neck-  supple  Lungs- Clear to ausculation bilaterally, normal work of breathing Heart- Irregular rate and rhythm, no murmurs, rubs or gallops  GI- soft, NT, ND, + BS Extremities- no clubbing, cyanosis, or edema MS- no significant deformity or atrophy Skin- no rash or lesion Psych- euthymic mood, full affect Neuro- strength and sensation are intact  Wt Readings from Last 3 Encounters:  04/28/23 112.8 kg  09/02/21 110.8 kg  03/03/21 114.3 kg    EKG today demonstrates (baseline artifact due to deep brain stimulator) Vent. rate 74 BPM  PR interval * ms QRS duration 86 ms QT/QTcB 386/428 ms P-R-T axes * 109 128  Critical Test Result: STEMI Atrial fibrillation Septal infarct , age undetermined Possible Lateral infarct , age undetermined Inferior injury pattern ** ** ACUTE MI / STEMI ** ** Consider right ventricular involvement in acute inferior infarct Abnormal ECG No previous ECGs available  Echo: scheduled for 05/10/23.   Epic records are reviewed at length today.  CHA2DS2-VASc Score =    The patient's score is based upon:        ASSESSMENT AND PLAN: Persistent Atrial Fibrillation (ICD10:  I48.19) The patient's CHA2DS2-VASc score is 0, indicating a 0.2% annual risk of stroke.    Education provided about Afib. Discussion about cardioversion as a procedure to attempt to convert him into normal rhythm. After discussion, we will proceed with plan to continue Eliquis 5 mg BID for 3 weeks without interruption. Due to patient having a deep brain stimulator, will speak with EP regarding ability to perform cardioversion. I will also contact his neurologist Dr. Rubin Payor at [facility] to confirm as well. If able to proceed with DCCV, would schedule 3 weeks from 5/30. He would be able to discontinue Eliquis 4 weeks after cardioversion.  Follow up 1-2 weeks after DCCV pending clearance by EP and Neurology.   Lake Bells, PA-C Afib Clinic [facility] [address] [place], [place]  [address] [phone] 04/28/2023 3:58 PM

## 2023-04-30 ENCOUNTER — Other Ambulatory Visit (HOSPITAL_COMMUNITY): Payer: Self-pay | Admitting: *Deleted

## 2023-04-30 ENCOUNTER — Telehealth (HOSPITAL_COMMUNITY): Payer: Self-pay | Admitting: *Deleted

## 2023-04-30 DIAGNOSIS — I4819 Other persistent atrial fibrillation: Secondary | ICD-10-CM

## 2023-04-30 NOTE — Telephone Encounter (Signed)
Cardioversion scheduled. Forwarded below information to area that handles cardioversions.

## 2023-04-30 NOTE — Telephone Encounter (Signed)
-----   Message from Eustace Pen, PA-C sent at 04/29/2023  3:02 PM EDT ----- Regarding: EP and Neurologist clearance for DCCV Antonio [name],  I saw this patient yesterday for persistent Afib and he has history of deep brain stimulator (DBS) due to Parkinson's disease. I wanted to set him up for a DCCV. I spoke with Dr. Lalla Brothers regarding this patient and he recommended to obtain clearance from patient's neurologist. I spoke with nurse coordinator of Dr. Carloyn [facility] and obtained the following information which would have to be relayed to the cardioversion team / cardiologist for DCCV:  "External Defibrillation - Safe usage of external defibrillation has not been established in DBS patients. Defibrillation of the patient is unlikely to permanently damage the implanted device if stimulation is turned off and the defibrillator electrode does not come into contact with any component of the implantable device. Testing has been completed to applicable standards.   If the patient is required to undergo lithotripsy, electrocautery, external defibrillation, radiation energy, ultrasonic scanning, X-ray, or CT scan, observe the following:  Turn off stimulation at least five minutes before the procedure application. All equipment, including probes, ground plates and paddles, must be used as far away from the Stimulator as possible and oriented such that energy is not directed through or across the Stimulator, Leads, or Lead Extensions. Every effort should be taken to keep fields, including current, radiation, or high-output ultrasonic beams, away from the Stimulator. Equipment should be set to the lowest energy setting clinically indicated.  Confirm the system is functioning properly following the procedure. Turn stimulation on and observe for the return of therapy to confirm functionality."  The nurse coordinator did say Dr. Rubin Payor has cleared him for cardioversion but wanted to relay above  information to [place].   Can you please call the patient to set up DCCV 3 weeks from 5/30? Patient has the remote to turn off/on his DBS. Thank you

## 2023-05-08 ENCOUNTER — Other Ambulatory Visit: Payer: Self-pay | Admitting: Neurology

## 2023-05-10 ENCOUNTER — Ambulatory Visit (HOSPITAL_COMMUNITY)
Admission: RE | Admit: 2023-05-10 | Discharge: 2023-05-10 | Disposition: A | Discharge status: Home / Self Care | Payer: 59 | Source: Ambulatory Visit | Attending: Internal Medicine | Admitting: Internal Medicine

## 2023-05-10 ENCOUNTER — Ambulatory Visit (HOSPITAL_COMMUNITY)
Admission: RE | Admit: 2023-05-10 | Discharge: 2023-05-10 | Disposition: A | Discharge status: Home / Self Care | Payer: 59 | Source: Ambulatory Visit | Attending: Family Medicine | Admitting: Family Medicine

## 2023-05-10 DIAGNOSIS — I4819 Other persistent atrial fibrillation: Secondary | ICD-10-CM | POA: Diagnosis not present

## 2023-05-10 DIAGNOSIS — G20A1 Parkinson's disease without dyskinesia, without mention of fluctuations: Secondary | ICD-10-CM | POA: Insufficient documentation

## 2023-05-10 DIAGNOSIS — I4891 Unspecified atrial fibrillation: Secondary | ICD-10-CM

## 2023-05-10 LAB — ECHOCARDIOGRAM COMPLETE
Area-P 1/2: 3.31 cm2
Calc EF: 57.3 %
MV M vel: 4.07 m/s
MV Peak grad: 66.3 mmHg
S' Lateral: 4 cm
Single Plane A2C EF: 58.9 %
Single Plane A4C EF: 55 %

## 2023-05-10 LAB — BASIC METABOLIC PANEL
Anion gap: 7 (ref 5–15)
BUN: 20 mg/dL (ref 8–23)
CO2: 26 mmol/L (ref 22–32)
Calcium: 9.2 mg/dL (ref 8.9–10.3)
Chloride: 104 mmol/L (ref 98–111)
Creatinine, Ser: 0.92 mg/dL (ref 0.61–1.24)
GFR, Estimated: >60 mL/min (ref >60)
Glucose, Bld: 108 mg/dL — ABNORMAL HIGH (ref 70–99)
Potassium: 4.2 mmol/L (ref 3.5–5.1)
Sodium: 137 mmol/L (ref 135–145)

## 2023-05-10 LAB — CBC
HCT: 44.2 % (ref 39.0–52.0)
Hemoglobin: 14.5 g/dL (ref 13.0–17.0)
MCH: 30 pg (ref 26.0–34.0)
MCHC: 32.8 g/dL (ref 30.0–36.0)
MCV: 91.5 fL (ref 80.0–100.0)
Platelets: 237 10*3/uL (ref 150–400)
RBC: 4.83 MIL/uL (ref 4.22–5.81)
RDW: 12.9 % (ref 11.5–15.5)
WBC: 8 10*3/uL (ref 4.0–10.5)
nRBC: 0 % (ref 0.0–0.2)

## 2023-05-10 NOTE — Progress Notes (Signed)
  Echocardiogram 2D Echocardiogram has been performed.  Antonio [name] 05/10/2023, 10:56 AM

## 2023-05-14 NOTE — Pre-Procedure Instructions (Signed)
Spoke to patient on phone regarding cardioversion on monday:  Instructed to arrive at 11, NPO after midnight  Confirmed patient has a ride home and responsible person to stay with patient for 24 hours after the procedure  Confirmed no missed doses of eliquis, instructed patient to ensure continue taking and take in the AM before the procedure with a sip of water

## 2023-05-17 ENCOUNTER — Other Ambulatory Visit: Payer: Self-pay

## 2023-05-17 ENCOUNTER — Ambulatory Visit (HOSPITAL_COMMUNITY): Payer: 59 | Admitting: Anesthesiology

## 2023-05-17 ENCOUNTER — Encounter (HOSPITAL_COMMUNITY): Payer: Self-pay | Admitting: Cardiology

## 2023-05-17 ENCOUNTER — Ambulatory Visit (HOSPITAL_COMMUNITY)
Admission: RE | Admit: 2023-05-17 | Discharge: 2023-05-17 | Disposition: A | Discharge status: Home / Self Care | Payer: 59 | Attending: Cardiology | Admitting: Cardiology

## 2023-05-17 ENCOUNTER — Ambulatory Visit (HOSPITAL_BASED_OUTPATIENT_CLINIC_OR_DEPARTMENT_OTHER): Payer: 59 | Admitting: Anesthesiology

## 2023-05-17 ENCOUNTER — Encounter (HOSPITAL_COMMUNITY)
Admission: RE | Disposition: A | Discharge status: Home / Self Care | Payer: Self-pay | Source: Home / Self Care | Attending: Cardiology

## 2023-05-17 DIAGNOSIS — Z7901 Long term (current) use of anticoagulants: Secondary | ICD-10-CM | POA: Diagnosis not present

## 2023-05-17 DIAGNOSIS — G20A1 Parkinson's disease without dyskinesia, without mention of fluctuations: Secondary | ICD-10-CM | POA: Insufficient documentation

## 2023-05-17 DIAGNOSIS — I4891 Unspecified atrial fibrillation: Secondary | ICD-10-CM | POA: Diagnosis not present

## 2023-05-17 DIAGNOSIS — I4819 Other persistent atrial fibrillation: Secondary | ICD-10-CM

## 2023-05-17 HISTORY — PX: CARDIOVERSION: SHX1299

## 2023-05-17 SURGERY — CARDIOVERSION
Anesthesia: General

## 2023-05-17 MED ORDER — PROPOFOL 10 MG/ML IV BOLUS
INTRAVENOUS | Status: DC | PRN
  Administered 2023-05-17: 100 mg via INTRAVENOUS

## 2023-05-17 MED ORDER — LIDOCAINE 2% (20 MG/ML) 5 ML SYRINGE
INTRAMUSCULAR | Status: DC | PRN
  Administered 2023-05-17: 100 mg via INTRAVENOUS

## 2023-05-17 MED ORDER — SODIUM CHLORIDE 0.9 % IV SOLN: INTRAVENOUS | Status: DC

## 2023-05-17 SURGICAL SUPPLY — 1 items: ELECT DEFIB PAD ADLT CADENCE (PAD) ×1 IMPLANT

## 2023-05-17 NOTE — Interval H&P Note (Signed)
History and Physical Interval Note:  05/17/2023 12:09 PM  Antonio [name]  has presented today for surgery, with the diagnosis of afib.  The various methods of treatment have been discussed with the patient and family. After consideration of risks, benefits and other options for treatment, the patient has consented to  Procedure(s): CARDIOVERSION (N/A) as a surgical intervention.  The patient's history has been reviewed, patient examined, no change in status, stable for surgery.  I have reviewed the patient's chart and labs.  Questions were answered to the patient's satisfaction.     Olga Millers

## 2023-05-17 NOTE — Anesthesia Postprocedure Evaluation (Signed)
Anesthesia Post Note  Patient: Antonio [name]  Procedure(s) Performed: CARDIOVERSION     Patient location during evaluation: PACU Anesthesia Type: General Level of consciousness: awake and alert Pain management: pain level controlled Vital Signs Assessment: post-procedure vital signs reviewed and stable Respiratory status: spontaneous breathing, nonlabored ventilation, respiratory function stable and patient connected to nasal cannula oxygen Cardiovascular status: blood pressure returned to baseline and stable Postop Assessment: no apparent nausea or vomiting Anesthetic complications: no   No notable events documented.  Last Vitals:  Vitals:   05/17/23 1255 05/17/23 1300  BP: 110/70 113/70  Pulse: (!) 58 (!) 56  Resp: 15 16  Temp:    SpO2: 95% 95%    Last Pain:  Vitals:   05/17/23 1248  TempSrc: Temporal  PainSc: 0-No pain                 Gladys Gutman

## 2023-05-17 NOTE — Procedures (Signed)
Electrical Cardioversion Procedure Note [name] [id number] August 26, 1961  Procedure: Electrical Cardioversion Indications:  Atrial Fibrillation  Procedure Details Consent: Risks of procedure as well as the alternatives and risks of each were explained to the (patient/caregiver).  Consent for procedure obtained. Time Out: Verified patient identification, verified procedure, site/side was marked, verified correct patient position, special equipment/implants available, medications/allergies/relevent history reviewed, required imaging and test results available.  Performed  As requested by neurology, deep brain stimulator was turned off for > 5 min prior to procedure; pads were placed away from generator; pt understands there is still risk that DCCV could potentially cause damage to the deep brain stimulator device.  Patient placed on cardiac monitor, pulse oximetry, supplemental oxygen as necessary.  Sedation given:  Pt sedated by anesthesia with lidocaine 100 mg and diprovan 100 mg IV. Pacer pads placed anterior and posterior chest.  Cardioverted 1 time(s).  Cardioverted at 120J.  Evaluation Findings: Post procedure EKG shows:Sinus bradycardia Complications: None Patient did tolerate procedure well.   Antonio [name] 05/17/2023, 12:07 PM

## 2023-05-17 NOTE — Anesthesia Preprocedure Evaluation (Addendum)
Anesthesia Evaluation  Patient identified by MRN, date of birth, ID band Patient awake    Reviewed: Allergy & Precautions, NPO status , Patient's Chart, lab work & pertinent test results  Airway Mallampati: I  TM Distance: >3 FB Neck ROM: Full    Dental no notable dental hx. (+) Teeth Intact, Dental Advisory Given   Pulmonary    Pulmonary exam normal breath sounds clear to auscultation       Cardiovascular Normal cardiovascular exam+ dysrhythmias Atrial Fibrillation  Rhythm:Regular Rate:Normal  ECHO 6/24 1. Left ventricular ejection fraction, by estimation, is 55 to 60%. The left ventricle has normal function. The left ventricle has no regional wall motion abnormalities. Left ventricular diastolic parameters are indeterminate.  2. Right ventricular systolic function is normal. The right ventricular size is normal. There is normal pulmonary artery systolic pressure. The estimated right ventricular systolic pressure is 21.7 mmHg.  3. The mitral valve is normal in structure. Trivial mitral valve regurgitation. No evidence of mitral stenosis.  4. The aortic valve is normal in structure. Aortic valve regurgitation is trivial. No aortic stenosis is present.  5. The inferior vena cava is normal in size with greater than 50% respiratory variability, suggesting right atrial pressure of 3 mmHg.    Neuro/Psych  PSYCHIATRIC DISORDERS  Depression     Neuromuscular disease    GI/Hepatic   Endo/Other    Renal/GU      Musculoskeletal   Abdominal   Peds  Hematology   Anesthesia Other Findings   Reproductive/Obstetrics                             Anesthesia Physical Anesthesia Plan  ASA: 3  Anesthesia Plan: General   Post-op Pain Management:  Regional for Post-op pain and Minimal or no pain anticipated   Induction: Intravenous  PONV Risk Score and Plan: 1 and Ondansetron and Midazolam  Airway  Management Planned: Natural Airway and Simple Face Mask  Additional Equipment: None  Intra-op Plan:   Post-operative Plan:   Informed Consent: I have reviewed the patients History and Physical, chart, labs and discussed the procedure including the risks, benefits and alternatives for the proposed anesthesia with the patient or authorized representative who has indicated his/her understanding and acceptance.       Plan Discussed with: CRNA and Anesthesiologist  Anesthesia Plan Comments: ( )        Anesthesia Quick Evaluation

## 2023-05-17 NOTE — H&P (Signed)
ATRIAL FIB NEW PATIENT 04/28/2023 [place] [facility] at [facility]   Antonio [name], [place] [facility] Persistent atrial fibrillation [facility]) Dx Referred by Antonio Rocks, MD Reason for Visit   Additional Documentation  Vitals: BP 140/82 Important    Pulse 93   Ht 6\' 1"  (1.854 m)   Wt 112.8 kg   BMI 32.81 kg/m   BSA 2.41 m      More Vitals  Flowsheets: Anthropometrics,   NEWS,   MEWS Score,   Vital Signs,   CHADS2-VASc Score,   Method of Visit  Encounter Info: Billing Info,   History,   Allergies,   Detailed Report   All Notes   Progress Notes by Antonio Pen, PA-C at 04/28/2023 3:00 PM  Author: Eustace Pen, PA-C Author Type: Physician Assistant Certified Filed: 04/28/2023  4:05 PM  Note Status: Addendum Cosign: Cosign Not Required Date of Service: 04/28/2023  3:00 PM  Editor: Antonio [name] (Physician Assistant Certified)      Prior Versions: 1. Antonio [name] (Physician Assistant Certified) at 04/28/2023  4:04 PM - Signed       Primary Care Physician: Antonio Rocks, MD Primary Cardiologist: None Primary Electrophysiologist: None Referring Physician: Dr. Gardiner Ramus [name] is a 62 y.o. male with a history of Parkinson's disease with deep brain stimulator placed and atrial fibrillation who presents for consultation in the [facility]. The patient was initially diagnosed with atrial fibrillation on 04/20/23 after presenting to PCP with symptoms of 3 months of feeling off and loss of stamina. ECG copy showed rate controlled Afib with HR 77. He was found to be in rate controlled Afib. Patient is on Eliquis 5 mg BID for a CHADS2VASC score of zero.   On evaluation today, he is in rate controlled Afib. He feels fatigued when in Afib. He has noted this symptom over the past 2-3 months. He started 2 full doses of Eliquis 5 mg BID on Thursday 5/30. He can do daily activities for  the most part without issue. He has a deep brain stimulator located in upper left chest with 2 electrodes placed in his brain (right electrode ~2.5 years ago, left ~3.5 years ago). His neurologist is Antonio [name] at [facility] last seen March 2024. He had a normal sleep study done 5-6 years ago.   He is compliant with anticoagulation and has not missed any doses. He has no bleeding concerns.   Today, he denies symptoms of palpitations, chest pain, shortness of breath, orthopnea, PND, lower extremity edema, dizziness, presyncope, syncope, snoring, daytime somnolence, bleeding, or neurologic sequela. The patient is tolerating medications without difficulties and is otherwise without complaint today.      he has a BMI of Body mass index is 32.81 kg/m.Marland Kitchen    Filed Weights    04/28/23 1452  Weight: 112.8 kg           Family History  Problem Relation Age of Onset   Heart disease Mother     Diabetes Father     Parkinson's disease Father     Colon cancer Neg Hx     Esophageal cancer Neg Hx     Rectal cancer Neg Hx     Stomach cancer Neg Hx        Atrial Fibrillation Management history:   Previous antiarrhythmic drugs: None Previous cardioversions: None Previous ablations: None Anticoagulation history: Eliquis 5 mg BID  Past Medical History:  Diagnosis Date   Arthritis     Dyskinesia      occasionally    Hx of colonic polyps 12/10/2010   Leg pain left   Parkinson's disease           Past Surgical History:  Procedure Laterality Date   COLONOSCOPY       KNEE ARTHROSCOPY Right 01/2017    with Dr Antonio [name] ;at surgical center    None listed       TOTAL KNEE ARTHROPLASTY Right 09/28/2017    Procedure: RIGHT TOTAL KNEE ARTHROPLASTY;  Surgeon: Antonio Romans, MD;  Location: [facility];  Service: Orthopedics;  Laterality: Right;  70 mins   TOTAL KNEE ARTHROPLASTY Left 04/12/2018    Procedure: LEFT TOTAL KNEE ARTHROPLASTY;  Surgeon: Antonio Romans, MD;  Location: [facility];  Service:  Orthopedics;  Laterality: Left;  Adductor Block            Current Outpatient Medications  Medication Sig Dispense Refill   carbidopa-levodopa (SINEMET CR) 50-200 MG tablet TAKE 1 TABLET BY MOUTH THREE TIMES A DAY 90 tablet 0   carbidopa-levodopa (SINEMET IR) 25-100 MG tablet Take 1 tablet by mouth 5 (five) times daily. Take at 7 AM, 10 AM, 1 PM, 4 PM and 7 PM 450 tablet 3   ELIQUIS 5 MG TABS tablet 5 mg 2 (two) times daily.       melatonin 5 MG TABS Take by mouth.       Multiple Vitamin (QUINTABS) TABS Take 1 tablet by mouth daily.       pramipexole (MIRAPEX) 1 MG tablet TAKE 1 TABLET BY MOUTH EVERYDAY AT BEDTIME 30 tablet 0   rasagiline (AZILECT) 1 MG TABS tablet TAKE 1 TABLET BY MOUTH EVERY DAY 90 tablet 0   valACYclovir (VALTREX) 1000 MG tablet Take 1,000 mg by mouth as needed (cold sores).        baclofen (LIORESAL) 10 MG tablet Take 1 tablet (10 mg total) by mouth at bedtime as needed for muscle spasms. (Patient not taking: Reported on 04/28/2023) 90 each 3   colchicine 0.6 MG tablet TAKE 1 TABLET (0.6 MG TOTAL) BY MOUTH DAILY AS NEEDED. (Patient not taking: Reported on 04/28/2023) 30 tablet 2   methocarbamol (ROBAXIN) 500 MG tablet Take 500 mg by mouth as needed for muscle spasms. (Patient not taking: Reported on 04/28/2023)       predniSONE (DELTASONE) 10 MG tablet Take 10 mg by mouth in the morning, at noon, in the evening, and at bedtime. (Patient not taking: Reported on 04/28/2023)        No current facility-administered medications for this encounter.      No Known Allergies   Social History         Socioeconomic History   Marital status: Married      Spouse name: Antonio [name]   Number of children: 3   Years of education: Ph.D   Highest education level: Not on file  Occupational History      Employer: [employer]  Tobacco Use   Smoking status: Never   Smokeless tobacco: Never  Vaping Use   Vaping Use: Never used  Substance and Sexual Activity   Alcohol use: Yes       Alcohol/week: 3.0 standard drinks of alcohol      Types: 3 Glasses of wine per week      Comment: 2 bottles of wine on week-end.   Drug use: No   Sexual activity: Not on file  Other Topics Concern   Not on file  Social History Narrative    Patient is right handed    Patient lives at home with his wife Antonio [name]. 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    Caffeine- coffee on occasion     Patient has a Ph.D.              Social Determinants of Health    Financial Resource Strain: Not on file  Food Insecurity: Not on file  Transportation Needs: Not on file  Physical Activity: Not on file  Stress: Not on file  Social Connections: Not on file  Intimate Partner Violence: Not on file      ROS- All systems are reviewed and negative except as per the HPI above.   Physical Exam:    Vitals:    04/28/23 1452  BP: (!) 140/82  Pulse: 93  Weight: 112.8 kg  Height: 6\' 1"  (1.854 m)      GEN- The patient is a well appearing male, alert and oriented x 3 today.   Head- normocephalic, atraumatic Eyes-  Sclera clear, conjunctiva pink Ears- hearing intact Oropharynx- clear Neck- supple  Lungs- Clear to ausculation bilaterally, normal work of breathing Heart- Irregular rate and rhythm, no murmurs, rubs or gallops  GI- soft, NT, ND, + BS Extremities- no clubbing, cyanosis, or edema MS- no significant deformity or atrophy Skin- no rash or lesion Psych- euthymic mood, full affect Neuro- strength and sensation are intact      Wt Readings from Last 3 Encounters:  04/28/23 112.8 kg  09/02/21 110.8 kg  03/03/21 114.3 kg      EKG today demonstrates (baseline artifact due to deep brain stimulator) Vent. rate 74 BPM PR interval * ms QRS duration 86 ms QT/QTcB 386/428 ms P-R-T axes * 109 128  Critical Test Result: STEMI Atrial fibrillation Septal infarct , age undetermined Possible Lateral infarct , age  undetermined Inferior injury pattern ** ** ACUTE MI / STEMI ** ** Consider right ventricular involvement in acute inferior infarct Abnormal ECG No previous ECGs available   Echo: scheduled for 05/10/23.    Epic records are reviewed at length today.   CHA2DS2-VASc Score =    The patient's score is based upon:          ASSESSMENT AND PLAN: Persistent Atrial Fibrillation (ICD10:  I48.19) The patient's CHA2DS2-VASc score is 0, indicating a 0.2% annual risk of stroke.     Education provided about Afib. Discussion about cardioversion as a procedure to attempt to convert him into normal rhythm. After discussion, we will proceed with plan to continue Eliquis 5 mg BID for 3 weeks without interruption. Due to patient having a deep brain stimulator, will speak with EP regarding ability to perform cardioversion. I will also contact his neurologist Antonio [name] at [facility] to confirm as well. If able to proceed with DCCV, would schedule 3 weeks from 5/30. He would be able to discontinue Eliquis 4 weeks after cardioversion.   Follow up 1-2 weeks after DCCV pending clearance by EP and Neurology.     Lake Bells, PA-C Afib Clinic [facility] [address] [place], [place] [address] [phone] 04/28/2023 3:58 PM        For DCCV; compliant with apixaban; no changes. Olga Millers

## 2023-05-17 NOTE — Transfer of Care (Signed)
Immediate Anesthesia Transfer of Care Note  Patient: Antonio [name]  Procedure(s) Performed: CARDIOVERSION  Patient Location: PACU and Cath Lab  Anesthesia Type:MAC  Level of Consciousness: awake and alert   Airway & Oxygen Therapy: Patient Spontanous Breathing and Patient connected to nasal cannula oxygen  Post-op Assessment: Report given to RN and Post -op Vital signs reviewed and stable  Post vital signs: Reviewed and stable  Last Vitals:  Vitals Value Taken Time  BP 125/85 05/17/23 1235  Temp    Pulse 60 05/17/23 1235  Resp 18 05/17/23 1235  SpO2 99 % 05/17/23 1235  Vitals shown include unvalidated device data.  Last Pain:  Vitals:   05/17/23 1141  TempSrc:   PainSc: 0-No pain         Complications: No notable events documented.

## 2023-05-18 ENCOUNTER — Encounter (HOSPITAL_COMMUNITY): Payer: Self-pay | Admitting: Cardiology

## 2023-05-21 ENCOUNTER — Other Ambulatory Visit: Payer: Self-pay | Admitting: Neurology

## 2023-05-21 ENCOUNTER — Encounter: Payer: Self-pay | Admitting: Neurology

## 2023-05-21 DIAGNOSIS — G20A1 Parkinson's disease without dyskinesia, without mention of fluctuations: Secondary | ICD-10-CM

## 2023-06-01 ENCOUNTER — Encounter (HOSPITAL_COMMUNITY): Payer: Self-pay | Admitting: Internal Medicine

## 2023-06-01 ENCOUNTER — Ambulatory Visit (HOSPITAL_COMMUNITY)
Admission: RE | Admit: 2023-06-01 | Discharge: 2023-06-01 | Disposition: A | Discharge status: Home / Self Care | Payer: 59 | Source: Ambulatory Visit | Attending: Internal Medicine | Admitting: Internal Medicine

## 2023-06-01 VITALS — BP 118/68 | HR 60 | Ht 73.0 in | Wt 248.8 lb

## 2023-06-01 DIAGNOSIS — I4819 Other persistent atrial fibrillation: Secondary | ICD-10-CM | POA: Diagnosis present

## 2023-06-01 DIAGNOSIS — G20A1 Parkinson's disease without dyskinesia, without mention of fluctuations: Secondary | ICD-10-CM | POA: Diagnosis not present

## 2023-06-01 NOTE — Progress Notes (Signed)
Primary Care Physician: Ozella Rocks, MD Primary Cardiologist: None Primary Electrophysiologist: None Referring Physician: Dr. Gardiner Ramus Mazin is a 62 y.o. male with a history of Parkinson's disease with deep brain stimulator placed and atrial fibrillation who presents for consultation in the [facility]. The patient was initially diagnosed with atrial fibrillation on 04/20/23 after presenting to PCP with symptoms of 3 months of feeling off and loss of stamina. ECG copy showed rate controlled Afib with HR 77. He was found to be in rate controlled Afib. Patient is on Eliquis 5 mg BID for a CHADS2VASC score of zero.  On evaluation today, he is in rate controlled Afib. He feels fatigued when in Afib. He has noted this symptom over the past 2-3 months. He started 2 full doses of Eliquis 5 mg BID on Thursday 5/30. He can do daily activities for the most part without issue. He has a deep brain stimulator located in upper left chest with 2 electrodes placed in his brain (right electrode ~2.5 years ago, left ~3.5 years ago). His neurologist is Dr. Rubin Payor at [facility] last seen March 2024. He had a normal sleep study done 5-6 years ago.  He is compliant with anticoagulation and has not missed any doses. He has no bleeding concerns.  On follow up 06/01/23, patient is currently in NSR. He is s/p successful DCCV on 05/17/23 with conversion to NSR. He feels well overall. He uses Kardiamobile for rhythm monitoring. He may have had a brief episode of Afib on Saturday due to overexertion in the heat. No missed doses of Eliquis.   Today, he denies symptoms of palpitations, chest pain, shortness of breath, orthopnea, PND, lower extremity edema, dizziness, presyncope, syncope, snoring, daytime somnolence, bleeding, or neurologic sequela. The patient is tolerating medications without difficulties and is otherwise without complaint today.   History of negative sleep study  08/2019.   he has a BMI of Body mass index is 32.83 kg/m.Marland Kitchen Filed Weights   06/01/23 1452  Weight: 112.9 kg     Family History  Problem Relation Age of Onset   Heart disease Mother    Diabetes Father    Parkinson's disease Father    Colon cancer Neg Hx    Esophageal cancer Neg Hx    Rectal cancer Neg Hx    Stomach cancer Neg Hx     Atrial Fibrillation Management history:  Previous antiarrhythmic drugs: None Previous cardioversions: 05/17/23 Previous ablations: None Anticoagulation history: Eliquis 5 mg BID   Past Medical History:  Diagnosis Date   Arthritis    Dyskinesia    occasionally    Hx of colonic polyps 12/10/2010   Leg pain left   Parkinson's disease    Past Surgical History:  Procedure Laterality Date   CARDIOVERSION N/A 05/17/2023   Procedure: CARDIOVERSION;  Surgeon: Lewayne Bunting, MD;  Location: [facility];  Service: Cardiovascular;  Laterality: N/A;   COLONOSCOPY     KNEE ARTHROSCOPY Right 01/2017   with Dr Charlann Boxer ;at surgical center    None listed     TOTAL KNEE ARTHROPLASTY Right 09/28/2017   Procedure: RIGHT TOTAL KNEE ARTHROPLASTY;  Surgeon: Durene Romans, MD;  Location: [facility];  Service: Orthopedics;  Laterality: Right;  70 mins   TOTAL KNEE ARTHROPLASTY Left 04/12/2018   Procedure: LEFT TOTAL KNEE ARTHROPLASTY;  Surgeon: Durene Romans, MD;  Location: [facility];  Service: Orthopedics;  Laterality: Left;  Adductor Block  Current Outpatient Medications  Medication Sig Dispense Refill   carbidopa-levodopa (SINEMET CR) 50-200 MG tablet TAKE 1 TABLET BY MOUTH THREE TIMES A DAY 90 tablet 0   ELIQUIS 5 MG TABS tablet Take 5 mg by mouth 2 (two) times daily.     ibuprofen (ADVIL) 200 MG tablet Take 200-400 mg by mouth at bedtime as needed for moderate pain or mild pain.     melatonin 5 MG TABS Take 5 mg by mouth at bedtime.     valACYclovir (VALTREX) 1000 MG tablet Take 1,000 mg by mouth as needed (cold sores).      pramipexole (MIRAPEX) 1 MG  tablet TAKE 1 TABLET BY MOUTH EVERYDAY AT BEDTIME (Patient not taking: Reported on 06/01/2023) 30 tablet 0   rasagiline (AZILECT) 1 MG TABS tablet TAKE 1 TABLET BY MOUTH EVERY DAY (Patient not taking: Reported on 06/01/2023) 90 tablet 0   No current facility-administered medications for this encounter.    No Known Allergies  ROS- All systems are reviewed and negative except as per the HPI above.  Physical Exam: Vitals:   06/01/23 1452  Weight: 112.9 kg  Height: 6\' 1"  (1.854 m)    GEN- The patient is well appearing, alert and oriented x 3 today.   Neck - no JVD or carotid bruit noted Lungs- Clear to ausculation bilaterally, normal work of breathing Heart- Regular rate and rhythm, no murmurs, rubs or gallops, PMI not laterally displaced Extremities- no clubbing, cyanosis, or edema Skin - no rash or ecchymosis noted   Wt Readings from Last 3 Encounters:  06/01/23 112.9 kg  05/17/23 111.1 kg  04/28/23 112.8 kg    EKG today demonstrates (baseline artifact due to deep brain stimulator) Vent. rate 60 BPM PR interval 112 ms QRS duration 96 ms QT/QTcB 460/460 ms P-R-T axes * 102 114 Normal sinus rhythm Lateral infarct , age undetermined Abnormal ECG When compared with ECG of 17-May-2023 12:40, PREVIOUS ECG IS PRESENT  Echo 05/10/23:   1. Left ventricular ejection fraction, by estimation, is 55 to 60%. The  left ventricle has normal function. The left ventricle has no regional  wall motion abnormalities. Left ventricular diastolic parameters are  indeterminate.   2. Right ventricular systolic function is normal. The right ventricular  size is normal. There is normal pulmonary artery systolic pressure. The  estimated right ventricular systolic pressure is 21.7 mmHg.   3. The mitral valve is normal in structure. Trivial mitral valve  regurgitation. No evidence of mitral stenosis.   4. The aortic valve is normal in structure. Aortic valve regurgitation is  trivial. No aortic  stenosis is present.   5. The inferior vena cava is normal in size with greater than 50%  respiratory variability, suggesting right atrial pressure of 3 mmHg.   Epic records are reviewed at length today.  CHA2DS2-VASc Score = 0  The patient's score is based upon: CHF History: 0 HTN History: 0 Diabetes History: 0 Stroke History: 0 Vascular Disease History: 0 Age Score: 0 Gender Score: 0       ASSESSMENT AND PLAN: Persistent Atrial Fibrillation (ICD10:  I48.19) The patient's CHA2DS2-VASc score is 0, indicating a 0.2% annual risk of stroke.   S/p DCCV on 05/17/23 with successful conversion to NSR.  He is currently in NSR.  Continue monitoring rhythm with Kardiamobile device. We discussed treatment options going forward if he notes increased Afib burden on device. He may be inclined to pursue ablation over long term AAD therapy. He can discontinue Eliquis  on 7/24, but we discussed if he notices paroxysmal Afib to remain on Eliquis and follow up sooner to revisit treatment.  Follow up 3 months Afib clinic.   Lake Bells, PA-C Afib Clinic [facility] [address] [place], [place] [address] [phone] 06/01/2023 3:04 PM

## 2023-06-03 IMAGING — CT CT CERVICAL SPINE W/O CM
3 of 4 series · 13 of 33 positions shown, 16 images · non-contrast
Comparison: None.

CLINICAL DATA: Osteoarthritis. Degenerative disc disease. Right arm
pain and numbness.

EXAM:
CT CERVICAL SPINE WITHOUT CONTRAST
TECHNIQUE: Multidetector CT imaging of the cervical spine was performed without
intravenous contrast. Multiplanar CT image reconstructions were also
generated.

[Series 9: c-spine 2.00 br60 s3 sag bone · sagittal · 0.33mm/px · 5 of 82 slices shown, 6 images]
[im 28/82  bone]
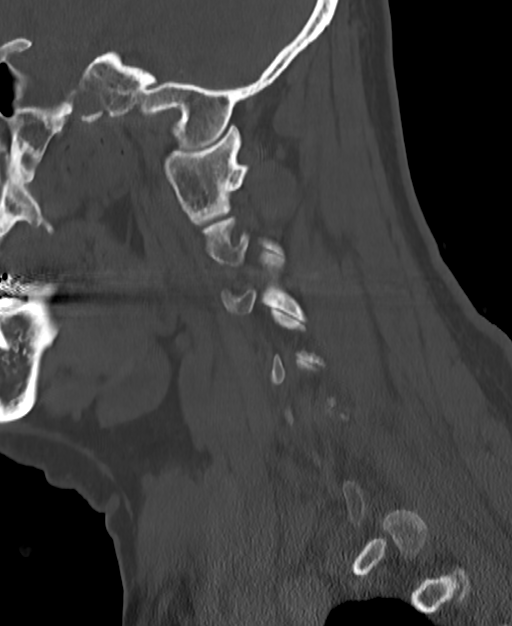
[im 34/82  bone]
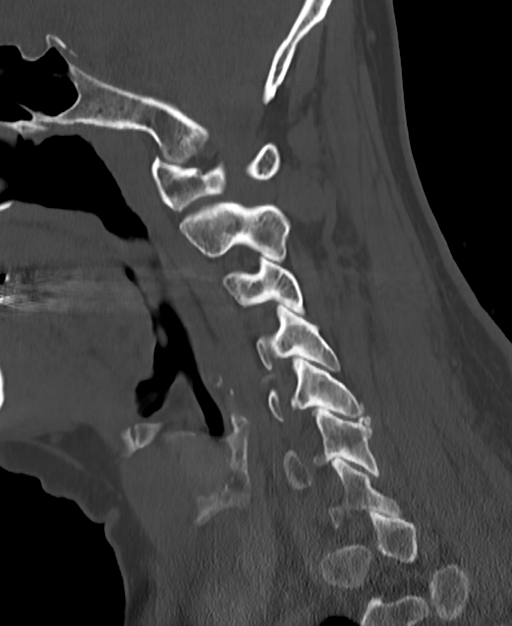
[im 41/82  soft-tissue]
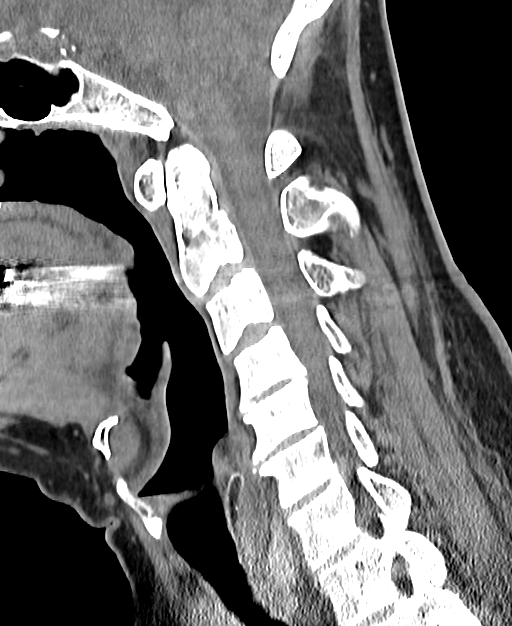
[im 41/82  bone]
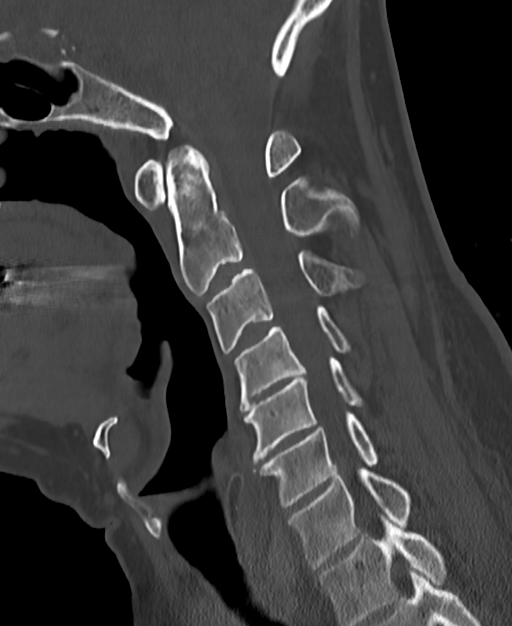
[im 48/82  bone]
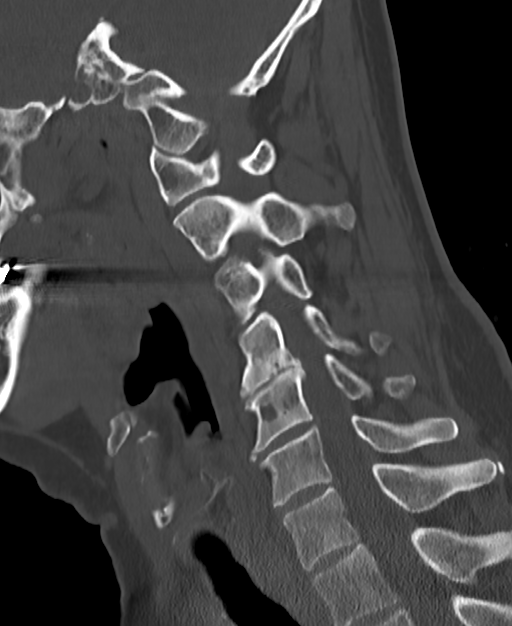
[im 55/82  bone]
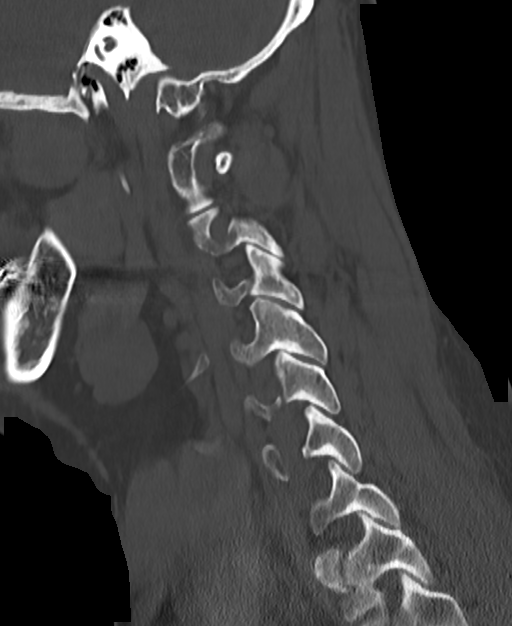

[Series 11: c-spine 2.00 hr60 s3 cor bone · coronal · 0.33mm/px · 3 of 83 slices shown]
[im 17/83  bone]
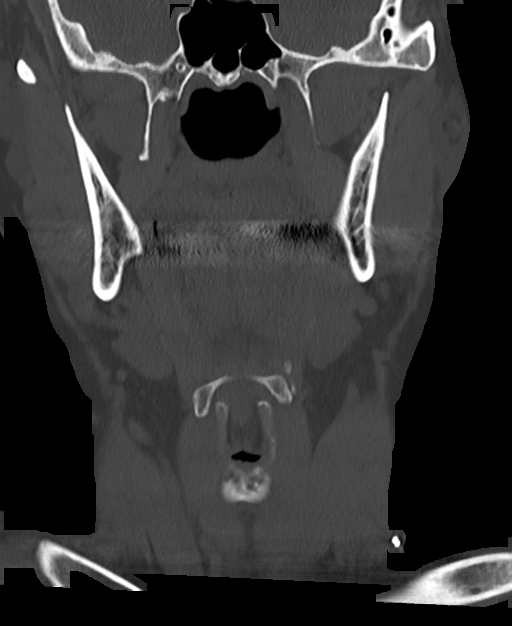
[im 33/83  bone]
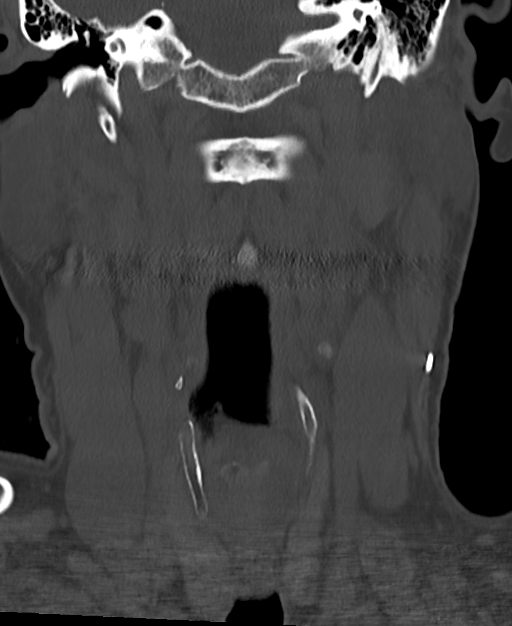
[im 50/83  bone]
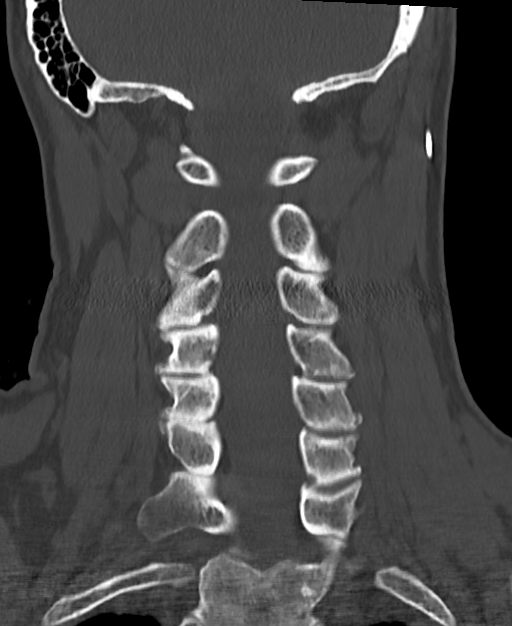

[Series 13: c-spine 2.00 hr40 s3 orthogonal axial soft · axial · 0.31mm/px · z∈[-925,-838]mm · 5 of 90 slices shown, 7 images]
[im 15/90  soft-tissue]
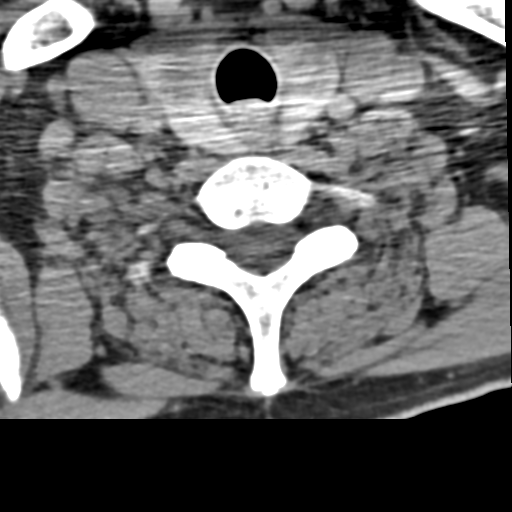
[im 15/90  bone]
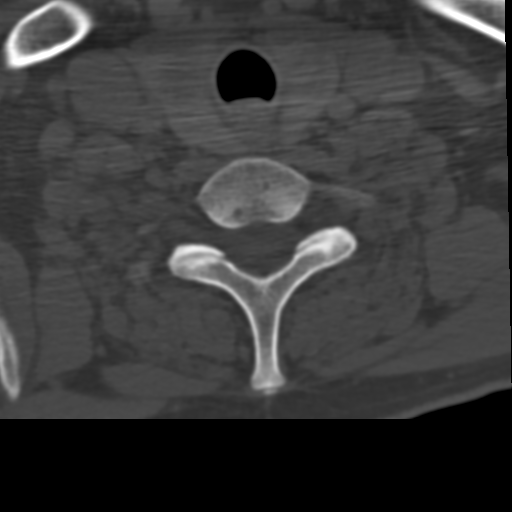
[im 30/90  bone]
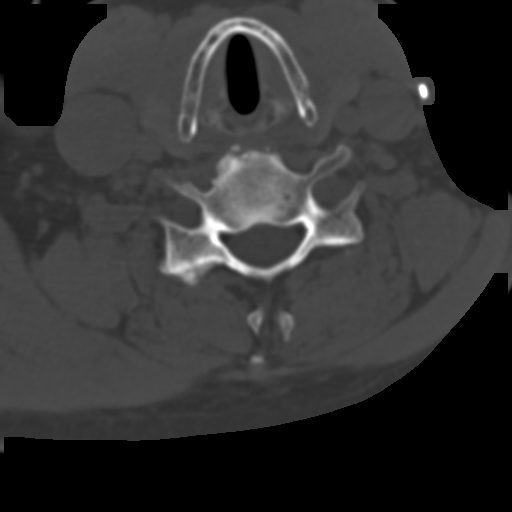
[im 45/90  bone]
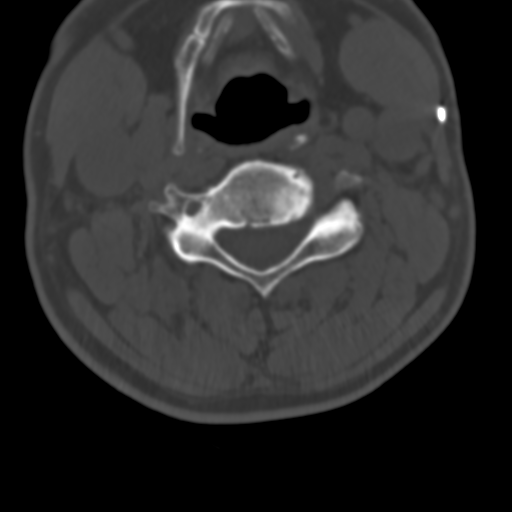
[im 60/90  bone]
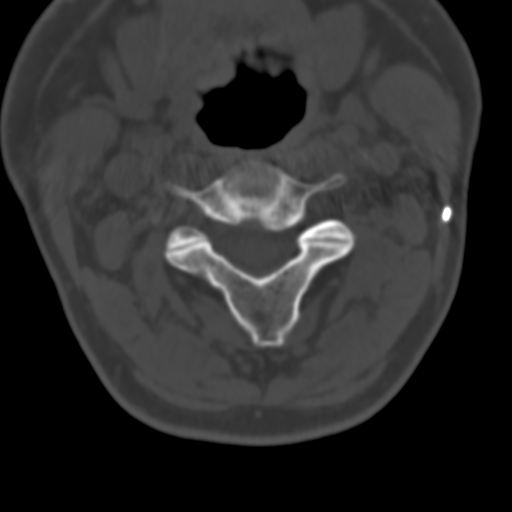
[im 75/90  soft-tissue]
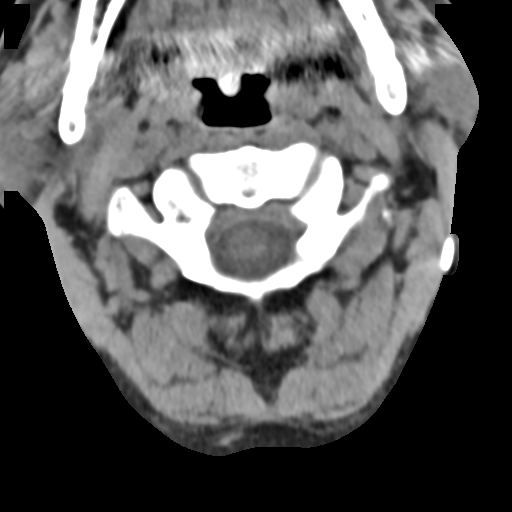
[im 75/90  bone]
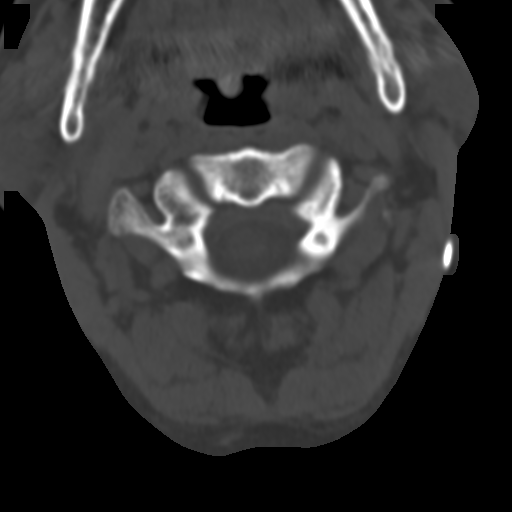

[13 of 33 positions shown; findings below may reference images not displayed]

FINDINGS: Alignment: No significant listhesis is present. Straightening of the
normal cervical lordosis is noted.

Skull base and vertebrae: The craniocervical junction is within
normal limits. Vertebral body heights maintained. No acute or
healing fractures are present. No focal osseous lesions are present.
Sclerotic endplate degenerative changes are present at C4-5.

Soft tissues and spinal canal: A 19 mm he hypodense nodule is
present anteriorly in the right lobe of the thyroid. Significant
adenopathy is present. No other focal cervical lesions are present.
Paraspinous soft tissues are otherwise within normal limits.

Disc levels: C2-3: Asymmetric left-sided uncovertebral spurring
partially effaces the ventral CSF. The foramina is patent.

C3-4: Mild uncovertebral spurring is present bilaterally. No focal
stenosis.

C4-5: Broad-based disc osteophyte complex effaces the ventral CSF.
Moderate central canal stenosis is present. Uncovertebral and facet
spurring leads to moderate foraminal narrowing, right greater than
left.

C5-6: Facet hypertrophy is worse on the right. Uncovertebral
spurring is worse on the left. Moderate left and mild right
foraminal narrowing is present.

C6-7: Uncovertebral spurring is noted bilaterally. Moderate right
foraminal narrowing is present. Mild left foraminal narrowing is
present.

C7-T1: Mild facet hypertrophy is noted bilaterally. No significant
stenosis is present.

Upper chest: Lung apices are clear. Thoracic inlet is within normal
limits.
IMPRESSION: 1. Multilevel degenerative changes of the cervical spine as
described.
2. Moderate central canal stenosis at C4-5 with moderate foraminal
narrowing, right greater than left.
3. Moderate left and mild right foraminal narrowing at C5-6.
4. Moderate right foraminal narrowing and mild left foraminal
narrowing at C6-7.
5. 19 mm hypodense nodule anteriorly in the right lobe of the
thyroid. Recommend thyroid US (ref: [HOSPITAL]. [DATE]): 143-50).

These results will be called to the ordering clinician or
representative by the Radiologist Assistant, and communication
documented in the PACS or [REDACTED].

## 2023-06-15 ENCOUNTER — Other Ambulatory Visit: Payer: Self-pay | Admitting: Neurology

## 2023-06-15 ENCOUNTER — Ambulatory Visit: Payer: 59 | Admitting: Neurology

## 2023-06-15 ENCOUNTER — Encounter: Payer: Self-pay | Admitting: Neurology

## 2023-06-15 VITALS — BP 117/82 | HR 54 | Ht 73.0 in | Wt 247.0 lb

## 2023-06-15 DIAGNOSIS — G20A2 Parkinson's disease without dyskinesia, with fluctuations: Secondary | ICD-10-CM | POA: Diagnosis not present

## 2023-06-15 DIAGNOSIS — R296 Repeated falls: Secondary | ICD-10-CM

## 2023-06-15 DIAGNOSIS — Z9689 Presence of other specified functional implants: Secondary | ICD-10-CM

## 2023-06-15 DIAGNOSIS — Z9181 History of falling: Secondary | ICD-10-CM

## 2023-06-15 DIAGNOSIS — R2689 Other abnormalities of gait and mobility: Secondary | ICD-10-CM

## 2023-06-15 DIAGNOSIS — G2581 Restless legs syndrome: Secondary | ICD-10-CM | POA: Diagnosis not present

## 2023-06-15 DIAGNOSIS — R419 Unspecified symptoms and signs involving cognitive functions and awareness: Secondary | ICD-10-CM | POA: Diagnosis not present

## 2023-06-15 DIAGNOSIS — G20A1 Parkinson's disease without dyskinesia, without mention of fluctuations: Secondary | ICD-10-CM

## 2023-06-15 MED ORDER — GABAPENTIN 100 MG PO CAPS: 100.0000 mg | ORAL_CAPSULE | Freq: Every day | ORAL | 5 refills | Status: DC

## 2023-06-15 MED ORDER — PRAMIPEXOLE DIHYDROCHLORIDE 1 MG PO TABS: 1.0000 mg | ORAL_TABLET | Freq: Every day | ORAL | 1 refills | Status: DC

## 2023-06-15 NOTE — Progress Notes (Signed)
Subjective:    [name] ID: Antonio [name] is a 62 y.o. male.  HPI    Interim history:   Antonio [name] is a very pleasant 62 year old right-handed gentleman with an underlying medical history of new onset A-fib, status post cardioversion in June 2024, PD, restless leg syndrome, and mild obesity, who presents for follow-up consultation of his right-sided predominant Parkinson's disease, s/p L, then R DBS, and his restless leg syndrome. Antonio [name] is accompanied by his wife today and presents after nearly 2 years. I last saw Antonio [name] on 09/02/2021, at which time Antonio [name] reported difficulty with his balance.  Antonio [name] had fallen several times.  Antonio [name] had more stress at work.  Antonio [name] was not consistent with his Sinemet IR.  Antonio [name] saw Dr. Rubin Payor in Antonio interim on 12/30/2021.  Antonio [name] had a subsequent follow-up with Valentina Shaggy, PA on 06/29/2022 and had a follow-up appointment with Dr. Rubin Payor on 02/10/2023.  Antonio [name] did not have any changes in his stimulator settings lately.  Antonio [name] was changed to Sinemet CR from Sinemet IR.   Antonio [name] was diagnosed with A-fib in Antonio interim in May 2024 and started seeing cardiology in June 2024, Antonio [name] is status post cardioversion on 05/17/2023.    Today, 06/15/2023: Antonio [name] reports having fallen several times.  His wife reports that Antonio [name] tends to fall when Antonio [name] starts moving too quickly, Antonio [name] has a tendency to walk on Antonio balls of his feet rather than rolling heel-to-toe.  Antonio [name] is in physical therapy once a week.  Antonio [name] has not used a walking aid, Antonio [name] has had some bruising but no head injuries thankfully.  Antonio [name] has a bony protuberance above his left ear, Antonio [name] is not sure how long ago Antonio [name] noticed it first, Antonio [name] does report that a light fixture fell on top of his head recently, Antonio [name] sustained a bruise but this is not close to where Antonio bony prominence is on Antonio side of his head.  Antonio [name] has ongoing issues with restless leg symptoms at night and has difficulty sleeping at night.  Antonio [name] takes pramipexole at bedtime.  Antonio [name] takes his Sinemet CR around 8 AM, 12 and  11 PM.  Antonio [name] does not have a set schedule for his mealtimes.  Sometimes a skipped breakfast and sometimes a skipped lunch depending on Antonio day.  Per wife, Antonio [name] has had more fatigue and forgetfulness.   Antonio [name]'s allergies, current medications, family history, past medical history, past social history, past surgical history and problem list were reviewed and updated as appropriate.    Previously (copied from previous notes for reference):      I saw Antonio [name] on 03/03/2021, at which time Antonio [name] reported doing fairly well after his right sided DBS.  Antonio [name] was on Sinemet CR at bedtime and was on Mirapex at Antonio time.  We restarted Azilect at that time.  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  We talked about initiating physical therapy.   Antonio [name] had an interim appointment with Valentina Shaggy, NP at [facility] in April 2022.   I saw Antonio [name] on 03/21/20, at which time Antonio [name] reported that his Neupro patch was helpful, Antonio [name] had residual restless leg symptoms at night.  His left hand symptoms particularly his tremor has become worse and Antonio [name] had a consultation regarding his second DBS with Dr. Rubin Payor and Dr. Tempie Donning.  Antonio [name] had interim right-sided DBS on 05/02/2020.    Antonio [name] had a neurosurgery postop visit on 06/12/2020 and a subsequent appointment with Dr. Rubin Payor on 07/17/2020.  Antonio [name] had a follow-up in programming appointment on 11/06/2020 with Dr. Rubin Payor, I reviewed prior notes.   Antonio [name] emailed in Antonio interim on 11/30/2020 requesting a refill on his pramipexole prescription, Antonio [name] was taking 1 mg at bedtime for his restless leg syndrome.  Antonio [name] had stopped Antonio Neupro patch about 2-1/2 months prior.  Antonio [name] reported this to his second DBS had done well and that Antonio [name] had been able to reduce his Sinemet.       I saw Antonio [name] on 12/19/2019, at which time Antonio [name] reported that his balance was worse.  Antonio [name] had had some falls without injuries.  I suggested we taper Antonio [name] off of Mirapex and start Antonio [name] on Neupro patch.  We increased Antonio patch in Antonio interim in March to 4 mg  once daily.    I saw Antonio [name] on 07/26/2019, at which time Antonio [name] felt some progression of his symptoms.  Antonio [name] had some leg cramping at night.  Antonio [name] felt his sleep was interrupted, Antonio [name] had more daytime somnolence.  Antonio [name] had reduced his Mirapex to 1 pill once daily for leg swelling.  Antonio [name] did increase it back up as Antonio leg swelling stayed Antonio same.  Antonio [name] had also reduced his Sinemet to 1 pill 4 times a day.  I suggested we proceed with a sleep study.  Antonio [name] was also advised to increase his Sinemet to 1 pill 5 times a day, keep his Sinemet CR at bedtime and Azilect once daily in Antonio morning. Antonio [name] had a baseline polysomnogram on 08/27/2019 which did not show any significant sleep disordered breathing, Antonio [name] had mild snoring.  Antonio [name] had no significant PLM's.  We called Antonio [name] with his test results at Antonio time.    I saw Antonio [name] on 01/23/2019, at which time Antonio [name] reported doing well.  Antonio [name] had a good programming appointment in 2019 in December which helped Antonio [name] quite a bit.  Antonio [name] was active physically.  Antonio [name] had some residual knee discomfort.  Antonio [name] was taking Advil as needed for this.  Antonio [name] had reduced his Mirapex on his own by skipping a dose here and there.  Antonio [name] was taking his nighttime dose consistently because of his restless legs.  Since Antonio [name] had some lower extremity swelling I suggested Antonio [name] reduce his Mirapex to half a pill in Antonio morning and midday and 1 pill at night.     I saw Antonio [name] on 07/20/2018, at which time Antonio [name] was recently status post left subthalamic DBS electrode placement on 07/14/2018, with generator placement pending for September 2019, on 08/22/2018. Antonio [name] had undergone left total knee replacement in May 2019. Antonio [name] had prior right total knee replacement. I suggested we keep his PD meds stable.      I saw Antonio [name] on 01/20/2018, at which time Antonio [name] was fairly stable. Antonio [name] had stopped taking Lexapro. Antonio [name] had undergone right total knee replacement surgery recently and was looking at left total knee replacement. Antonio [name] had DBS consultation at [facility] and was supposed to see  a neurosurgeon next. Antonio [name] had a recent fall. Thankfully Antonio [name] did not injure himself. I suggested we continue his medication regimen.        I saw Antonio [name] on 09/22/2017, at which time Antonio [name] reported a fall. His right knee was bothering Antonio [name]. Antonio [name] was scheduled for right total knee replacement on 09/28/2017. Antonio [name] was also scheduled for a DBS consultation at [facility].   I saw Antonio [name] on 06/22/2017, at which time Antonio [name] reported more off time. Antonio [name] felt Antonio [name] was more progressed in  his symptoms. His wife reported that Antonio [name] had fallen several times. Antonio [name] had more recent stressors. His wife had noted more freezing. Antonio [name] was more irritable and frustrated easier. Antonio [name] had more issues with his right knee, most likely needing total knee replacement. I suggested we increase his Sinemet to 1 pill 5 times a day and keep his Mirapex 1 mg 3 times a day, Sinemet CR bedtime, and keep his Azilect Antonio same. I suggested an opinion for DBS evaluation at [facility]. I also suggested Antonio [name] start taking Lexapro 5 mg strength.    I saw Antonio [name] on 02/16/2017, at which time Antonio [name] was on Sinemet 4 times a day and Sinemet CR at bedtime. Antonio [name] had to have interim right knee surgery after an injury. Antonio [name] was still on hydrocodone as needed for pain. 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 Lower extremity swelling was about Antonio same, memory stable, no recent falls. No side effects from his medication. Antonio [name] was off of Azilect for his surgery and was able to restarted without problems after about 2 weeks. I asked Antonio [name] to continue with Mirapex 3 times a day, Sinemet CR bedtime, Sinemet IR 4 times a day and Azilect once daily.   I saw Antonio [name] on 05/12/2016, at which time Antonio [name] was reporting right leg pain and thigh pain. This seemed to be worse in between Sinemet doses. Antonio [name] was on Sinemet 3 times a day but was not always keeping a set schedule for it. Antonio [name] was also taking Antonio CR Sinemet at night more consistently. I suggested low-dose baclofen as needed 10 mg strength for his  right leg muscle tension and pain. I suggested we continue with Azilect and Mirapex at Antonio current doses but increase his Sinemet to 1 pill 4 times a day at 7, 11, 3 PM and 7 PM. Antonio [name] was also encouraged to take Antonio CR at night.     I saw Antonio [name] on 01/13/16, at which time Antonio [name] reported feeling fairly stable. Antonio [name] did not continue with Antonio CR at night as Antonio [name] did not notice much in Antonio way of difference. Antonio [name] denied any recent restless leg symptoms. Antonio [name] did feel Mirapex was helpful for that. Antonio [name] was taking Sinemet 3 times a day and Mirapex 3 times a day, Azilect 1 mg once daily, which was generic. Antonio [name] reported fall about a week prior to his last appointment. Antonio [name] scraped his knee.Antonio [name] does not always drink enough water. Antonio [name] had some right-sided sciatica symptoms and radiating pain to Antonio back of his thigh.  I ordered an MRI L spine wo contrast, which Antonio [name] had on 01/22/16:  IMPRESSION:  This MRI of Antonio lumbar spine without contrast shows Antonio following: 1.    At L3-L4, there is a midline disc protrusion and mild facet hypertrophy combining to cause borderline spinal stenosis and moderate left lateral recess stenosis there does not appear to be nerve root compression though there is some encroachment upon Antonio traversing left L4 nerve root. 2.   There are milder degenerative changes at L2-L3, L4-L5 and L5-S1 with less potential for nerve root impingement. 3.   There are no acute findings.   We called Antonio [name] with Antonio test results.    I saw Antonio [name] on 09/09/2015, at which time Antonio [name] reported that Antonio addition of Azilect was helpful. I suggested Antonio [name] continue with Sinemet tid, Mirapex 1 mg tid, Azilect once daily and try C/L CR at night.   I saw Antonio [name] on 04/24/2015, at  which time Antonio [name] reported worsening balance and increase in his tremors. Unfortunately, Antonio [name] had fallen recently and fell backwards as Antonio [name] stood up from a chair and fell onto his back after which she felt sore for a couple of days but thankfully did not injure himself seriously. Antonio [name]  did not have any head injury or loss of consciousness. Antonio [name] was working 2 jobs. Antonio [name] was not drinking enough water. Antonio [name] was not sleeping as well at night. Memory was stable. Antonio [name] denied significant depressive symptoms. His restless legs symptoms were under control. I suggested she continue with Sinemet and Mirapex, but I asked Antonio [name] to restart Azilect which Antonio [name] had tried in Antonio past. We talked about potentially pursuing DBS surgery in Antonio future.   I first met Antonio [name] on 12/25/2014, at which time Antonio [name] reported improved parkinsonian symptoms with Mirapex and Sinemet. I asked Antonio [name] to continue his medications, Mirapex 1 mg strength one pill 3 times a day and Sinemet 25-100 milligrams strength one pill 3 times a day. I suggested Antonio [name] could add a half pill of Sinemet as needed in Antonio evenings.    Antonio [name] previously followed with Dr. Elspeth Cho was last seen by Antonio [name] on 07/17/2014, at which time Antonio [name] reported having stopped Sinemet and Antonio [name] was using it as needed only. His Mirapex was increased to 1 mg 3 times a day as Antonio [name] also reported residual restless leg symptoms. Antonio addition of Azilect was discussed.  Antonio [name] talked about DBS for future consideration.   Prior to that Antonio [name] was followed by Dr. Avie Echevaria and was last seen by Dr. Sandria Manly on 11/25/2011. Antonio [name] was originally seen by Dr. Sandria Manly in November 2009 at which time Antonio [name] presented with right-sided stiffness and fine motor dyscontrol. Antonio [name] has a positive family history of Parkinson's disease in his father. Antonio [name] was initially started on Azilect and Mirapex was added in 2011. Antonio [name] started Sinemet 25-100 milligrams strength one pill 3 times a day and eventually stop Antonio Azilect, due to side effects reported, including cognitive SEs.   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 Antonio [name] is a non-smoker, Antonio [name] drinks wine usually on weekends.    Antonio [name] noted a R hand tremor in Antonio last 6 months. Antonio [name] has work related stress. Antonio [name] sleeps well generally speaking. Confined spaces  are more difficulty to negotiate. Antonio [name] has mild memory issues. Antonio [name] has not fallen. Antonio [name] did not do well without Antonio Sinemet and has since then restarted it. Antonio [name] takes it 3 times a day along with one pill of Mirapex. Antonio [name] has not taken his midday dose yet. Antonio [name] has no significant side effects except for mild sleepiness. Antonio [name] has never fallen asleep while driving. Antonio [name] has mild swelling in his feet at times especially on longer plane rides. Antonio [name] has to travel internationally maybe 3 times a year. His medication dose does last for 4 and sometimes 5 hours but at Antonio end of Antonio day especially if Antonio [name]'s had a long work day Antonio [name] feels fatigued and more off. Antonio [name] goes to bed late. It is usually between midnight and 1 AM. Antonio [name] has a rise time of 7:30 AM. Antonio [name] snores when Antonio [name] sleeps on his back but does not have any gasping sensations are witnessed apneas as Antonio [name] recalls. His RLS symptoms are under control.       His Past Medical History Is Significant For: Past Medical History:  Diagnosis Date   Arthritis  Dyskinesia    occasionally    Hx of colonic polyps 12/10/2010   Leg pain left   Parkinson's disease     His Past Surgical History Is Significant For: Past Surgical History:  Procedure Laterality Date   CARDIOVERSION N/A 05/17/2023   Procedure: CARDIOVERSION;  Surgeon: Lewayne Bunting, MD;  Location: [facility];  Service: Cardiovascular;  Laterality: N/A;   COLONOSCOPY     KNEE ARTHROSCOPY Right 01/2017   with Dr Charlann Boxer ;at surgical center    None listed     TOTAL KNEE ARTHROPLASTY Right 09/28/2017   Procedure: RIGHT TOTAL KNEE ARTHROPLASTY;  Surgeon: Durene Romans, MD;  Location: [facility];  Service: Orthopedics;  Laterality: Right;  70 mins   TOTAL KNEE ARTHROPLASTY Left 04/12/2018   Procedure: LEFT TOTAL KNEE ARTHROPLASTY;  Surgeon: Durene Romans, MD;  Location: [facility];  Service: Orthopedics;  Laterality: Left;  Adductor Block    His Family History Is Significant For: Family History  Problem Relation Age of Onset    Heart disease Mother    Diabetes Father    Parkinson's disease Father    Colon cancer Neg Hx    Esophageal cancer Neg Hx    Rectal cancer Neg Hx    Stomach cancer Neg Hx     His Social History Is Significant For: Social History   Socioeconomic History   Marital status: Married    Spouse name: Youth worker   Number of children: 3   Years of education: Ph.D   Highest education level: Not on file  Occupational History    Employer: [employer]  Tobacco Use   Smoking status: Never   Smokeless tobacco: Never  Vaping Use   Vaping status: Never Used  Substance and Sexual Activity   Alcohol use: Not Currently   Drug use: No   Sexual activity: Not on file  Other Topics Concern   Not on file  Social History Narrative   [name] is right handed   [name] lives at home with his wife Dorene Grebe. 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   Caffeine- coffee on occasion    [name] has a Ph.D.         Social Determinants of Health   Financial Resource Strain: Not on file  Food Insecurity: Not on file  Transportation Needs: Not on file  Physical Activity: Not on file  Stress: Not on file  Social Connections: Not on file    His Allergies Are:  No Known Allergies:   His Current Medications Are:  Outpatient Encounter Medications as of 06/15/2023  Medication Sig   carbidopa-levodopa (SINEMET CR) 50-200 MG tablet TAKE 1 TABLET BY MOUTH THREE TIMES A DAY   ELIQUIS 5 MG TABS tablet Take 5 mg by mouth 2 (two) times daily.   ibuprofen (ADVIL) 200 MG tablet Take 200-400 mg by mouth at bedtime as needed for moderate pain or mild pain.   melatonin 5 MG TABS Take 5 mg by mouth at bedtime.   pramipexole (MIRAPEX) 1 MG tablet TAKE 1 TABLET BY MOUTH EVERYDAY AT BEDTIME   rasagiline (AZILECT) 1 MG TABS tablet TAKE 1 TABLET BY MOUTH EVERY DAY   valACYclovir (VALTREX) 1000 MG tablet Take 1,000 mg by mouth as needed (cold sores).    No  facility-administered encounter medications on file as of 06/15/2023.  :  Review of Systems:  Out of a complete 14 point review of systems, all are  reviewed and negative with Antonio exception of these symptoms as listed below:  Review of Systems  Neurological:        Pt here parkinson f/u Pt having issues with gait Pt has increased RLS and not sleeping well at night Pt states having hard time remembering names  Wife states when [name] is fatigue symptoms increase Pt states has AFIB     Objective:  Neurological Exam  Physical Exam Physical Examination:   Vitals:   06/15/23 1046  BP: 117/82  Pulse: (!) 54    General Examination: Antonio [name] is a very pleasant 62 y.o. male in no acute distress. Antonio [name] appears well-developed and well-nourished and well groomed.   HEENT: Normocephalic, atraumatic, pupils are equal, round and reactive to light, corrective eyeglasses in place.  Tracking is impaired.  No obvious nystagmus seen. Antonio [name] has a decreased eye blink rate and moderate  facial masking, mild hypophonia, no dysarthria.  Small bony prominence, not mobile above left ear, not painful to touch. There is no drooling, no nasal drainage. No facial dyskinesias are noted. Well-healed scars from left and right DBS.  Mild nuchal rigidity.  No significant neck pain.   Chest: Clear to auscultation without wheezing, rhonchi or crackles noted.   Heart: S1+S2+0, regular and normal without murmurs, rubs or gallops noted.    Abdomen: Soft, non-tender and non-distended.   Extremities: There is 1+ edema in Antonio distal lower extremities bilaterally, mostly around ankles.    Skin: Warm and dry without trophic changes noted, with chronic changes in distal legs.   Musculoskeletal: exam reveals mild bilateral knee swelling, right a little worse than left. Mild residual discomfort in both knees.   Neurologically:  Mental status: Antonio [name] is awake, alert and oriented in all 4 spheres. His immediate and remote  memory, attention, language skills and fund of knowledge are fair, details of his history are provided by his wife.  Thought process is linear. Mood is normal and affect is normal.   Cranial nerves II - XII are as described above under HEENT exam.  Motor exam: Normal bulk, and strength are noted, Antonio [name] has fairly good range of motion, no obvious resting tremor today, overall mild to moderate bradykinesia.  No obvious dyskinesias, tone is mildly increased in Antonio right upper extremity, fairly normal in Antonio left upper extremity.  Overall mild to moderate difficulty with fine motor skills.   Antonio [name] stands up with mild to moderate difficulty and his posture is stooped.  Some start hesitation, turns slowly, mild insecurity with turns, no telltale shuffling.  Decreased arm swing bilaterally. Sensory exam is intact to light touch.  Cerebellar: no dysmetria. No gait ataxia.   Assessment and plan:    In summary, Antonio [name] is a 62 year old right-handed gentleman with an underlying medical history of new onset A-fib, status post cardioversion in June 2024, PD, restless leg syndrome, and mild obesity, who presents for follow-up consultation of his right-sided predominant Parkinson's disease, s/p bilateral DBS.  PD diagnosis dates back to 2009 and symptoms dating back to about 2008. Antonio [name] is status post L DBS placement in 2019, generator placement in September and electrode placement in August 2019. Antonio [name] is status post right DBS in June 2021.  Antonio [name] has had worsening balance and recurrent falls.  Antonio [name] was on Neupro in Antonio past and immediate release Sinemet but these were stopped.  Antonio [name] continues to take Mirapex at bedtime for his restless leg syndrome.  Antonio [name] is advised to take it at 5  PM, about 2 hours before bedtime.  In addition, I suggested adjunct treatment with gabapentin low-dose, 100 mg strength at bedtime.  We talked about expectations and off label use of gabapentin and possible common side effects.  Antonio [name] is advised to  continue with Sinemet CR 3 times a day but take it at 9 AM, 1 PM and 5 PM daily.  We restarted his Azilect in April 2022 and Antonio [name] is advised to continue with it.  We talked about gait safety and fall prevention.  Antonio [name] is encouraged to start using a walker not to rely on it to remind himself that Antonio [name] needs to start walking slowly and cautiously, get his bearings first.   Antonio [name] had issues with interim back pain and radiculopathy, had seen orthopedics.   Antonio [name] had undergone epidural steroid injections into Antonio neck.   Antonio [name] was found to have a thyroid nodule for which Antonio [name] had another biopsy in May 2023.  Findings were not in keeping with malignancy.  Antonio [name] is encouraged to continue with scheduled follow-up appointments with neurology at [facility].  Antonio [name] is advised to have them look at Antonio bony prominence above his left ear.  Antonio [name] is advised to follow-up in this clinic to see one of our nurse practitioners in 6 months, sooner if needed. I answered all their questions today and Antonio [name] and his wife are in agreement. I spent 45 minutes in total face-to-face time and in reviewing records during pre-charting, more than 50% of which was spent in counseling and coordination of care, reviewing test results, reviewing medications and treatment regimen and/or in discussing or reviewing Antonio diagnosis of PD, RLS, recurrent falls, Antonio prognosis and treatment options. Pertinent laboratory and imaging test results that were available during this visit with Antonio [name] were reviewed by me and considered in my medical decision making (see chart for details).

## 2023-06-15 NOTE — Patient Instructions (Signed)
It was nice to see you again today.  Here is what we discussed:  Please continue with Sinemet CR, generic, 50-200 mg strength 1 pill 3 times daily at 9 AM, 1 PM and 5 PM daily. You can continue with rasagiline 1 mg once daily in the morning. You can continue with pramipexole 1 mg at night, I would take it 2 hours before bedtime for restless legs. As an adjunct for your restless leg symptoms, we will add gabapentin 100 mg at bedtime. Start Neurontin (gabapentin) 100 mg strength: Take 1 pill nightly at bedtime. The most common side effects reported are sedation or sleepiness. Rare side effects include balance problems, confusion.  Consider using a walker to help slow down your walking and your tendency to fall when you start walking too fast and start moving too fast. Follow-up in 6 months with our nurse practitioner here.  Continue following with Valentina Shaggy, PA and Dr. Westley Hummer regularly.  You can ask them about the protrusion and bony prominence on your head on the left side above the ear.

## 2023-07-13 ENCOUNTER — Ambulatory Visit: Payer: 59 | Admitting: Neurology

## 2023-07-27 ENCOUNTER — Encounter (HOSPITAL_COMMUNITY): Payer: Self-pay | Admitting: Internal Medicine

## 2023-07-27 ENCOUNTER — Ambulatory Visit (HOSPITAL_COMMUNITY)
Admission: RE | Admit: 2023-07-27 | Discharge: 2023-07-27 | Disposition: A | Discharge status: Home / Self Care | Payer: 59 | Source: Ambulatory Visit | Attending: Internal Medicine | Admitting: Internal Medicine

## 2023-07-27 VITALS — BP 130/84 | HR 50 | Ht 73.0 in | Wt 245.2 lb

## 2023-07-27 DIAGNOSIS — Z7901 Long term (current) use of anticoagulants: Secondary | ICD-10-CM | POA: Insufficient documentation

## 2023-07-27 DIAGNOSIS — I4819 Other persistent atrial fibrillation: Secondary | ICD-10-CM | POA: Diagnosis present

## 2023-07-27 DIAGNOSIS — Z8249 Family history of ischemic heart disease and other diseases of the circulatory system: Secondary | ICD-10-CM | POA: Diagnosis not present

## 2023-07-27 DIAGNOSIS — Z9682 Presence of neurostimulator: Secondary | ICD-10-CM | POA: Diagnosis not present

## 2023-07-27 DIAGNOSIS — G20A1 Parkinson's disease without dyskinesia, without mention of fluctuations: Secondary | ICD-10-CM | POA: Insufficient documentation

## 2023-07-27 MED ORDER — ELIQUIS 5 MG PO TABS: 5.0000 mg | ORAL_TABLET | Freq: Two times a day (BID) | ORAL | 3 refills | Status: DC

## 2023-07-27 NOTE — Progress Notes (Addendum)
Primary Care Physician: Ozella Rocks, MD Primary Cardiologist: None Primary Electrophysiologist: None Referring Physician: Dr. Gardiner Ramus Schulze is a 62 y.o. male with a history of Parkinson's disease with deep brain stimulator placed and atrial fibrillation who presents for consultation in the [facility]. The patient was initially diagnosed with atrial fibrillation on 04/20/23 after presenting to PCP with symptoms of 3 months of feeling off and loss of stamina. ECG copy showed rate controlled Afib with HR 77. He was found to be in rate controlled Afib. Patient is on Eliquis 5 mg BID for a CHADS2VASC score of zero.  On evaluation today, he is in rate controlled Afib. He feels fatigued when in Afib. He has noted this symptom over the past 2-3 months. He started 2 full doses of Eliquis 5 mg BID on Thursday 5/30. He can do daily activities for the most part without issue. He has a deep brain stimulator located in upper left chest with 2 electrodes placed in his brain (right electrode ~2.5 years ago, left ~3.5 years ago). His neurologist is Dr. Rubin Payor at [facility] last seen March 2024. He had a normal sleep study done 5-6 years ago.  He is compliant with anticoagulation and has not missed any doses. He has no bleeding concerns.  On follow up 06/01/23, patient is currently in NSR. He is s/p successful DCCV on 05/17/23 with conversion to NSR. He feels well overall. He uses Kardiamobile for rhythm monitoring. He may have had a brief episode of Afib on Saturday due to overexertion in the heat. No missed doses of Eliquis.   On follow up 07/27/23, he is currently in NSR. Seen in [facility] ED on 8/10 for dental caries/swollen neck and found to be in transient Afib with RVR / possible atrial flutter. Patient tells me that via Kardiamobile device and symptoms he is noting to have paroxysmal episodes of Afib 2-3 times per week. He is able to show me on his phone several  strips indicating PAF. He feels dizzy and SOB during episodes. They are lasting several hours. He has stopped Eliquis 4 weeks after ablation.   Today, he denies symptoms of palpitations, chest pain, shortness of breath, orthopnea, PND, lower extremity edema, dizziness, presyncope, syncope, snoring, daytime somnolence, bleeding, or neurologic sequela. The patient is tolerating medications without difficulties and is otherwise without complaint today.   History of negative sleep study 08/2019.   he has a BMI of Body mass index is 32.35 kg/m.Marland Kitchen Filed Weights   07/27/23 1040  Weight: 111.2 kg    Family History  Problem Relation Age of Onset   Heart disease Mother    Diabetes Father    Parkinson's disease Father    Colon cancer Neg Hx    Esophageal cancer Neg Hx    Rectal cancer Neg Hx    Stomach cancer Neg Hx     Atrial Fibrillation Management history:  Previous antiarrhythmic drugs: None Previous cardioversions: 05/17/23 Previous ablations: None Anticoagulation history: Eliquis 5 mg BID   Past Medical History:  Diagnosis Date   Arthritis    Dyskinesia    occasionally    Hx of colonic polyps 12/10/2010   Leg pain left   Parkinson's disease    Past Surgical History:  Procedure Laterality Date   CARDIOVERSION N/A 05/17/2023   Procedure: CARDIOVERSION;  Surgeon: Lewayne Bunting, MD;  Location: [facility];  Service: Cardiovascular;  Laterality: N/A;   COLONOSCOPY  KNEE ARTHROSCOPY Right 01/2017   with Dr Charlann Boxer ;at surgical center    None listed     TOTAL KNEE ARTHROPLASTY Right 09/28/2017   Procedure: RIGHT TOTAL KNEE ARTHROPLASTY;  Surgeon: Durene Romans, MD;  Location: [facility] ORS;  Service: Orthopedics;  Laterality: Right;  70 mins   TOTAL KNEE ARTHROPLASTY Left 04/12/2018   Procedure: LEFT TOTAL KNEE ARTHROPLASTY;  Surgeon: Durene Romans, MD;  Location: [facility] ORS;  Service: Orthopedics;  Laterality: Left;  Adductor Block    Current Outpatient Medications  Medication  Sig Dispense Refill   carbidopa-levodopa (SINEMET CR) 50-200 MG tablet TAKE 1 TABLET BY MOUTH THREE TIMES A DAY 90 tablet 0   gabapentin (NEURONTIN) 100 MG capsule Take 1 capsule (100 mg total) by mouth at bedtime. 30 capsule 5   ibuprofen (ADVIL) 200 MG tablet Take 200-400 mg by mouth at bedtime as needed for moderate pain or mild pain.     melatonin 5 MG TABS Take 5 mg by mouth at bedtime.     pramipexole (MIRAPEX) 1 MG tablet Take 1 tablet (1 mg total) by mouth at bedtime. Around 9 PM daily. 90 tablet 1   rasagiline (AZILECT) 1 MG TABS tablet TAKE 1 TABLET BY MOUTH EVERY DAY 30 tablet 2   valACYclovir (VALTREX) 1000 MG tablet Take 1,000 mg by mouth as needed (cold sores).      ELIQUIS 5 MG TABS tablet Take 1 tablet (5 mg total) by mouth 2 (two) times daily. 60 tablet 3   No current facility-administered medications for this encounter.    No Known Allergies  ROS- All systems are reviewed and negative except as per the HPI above.  Physical Exam: Vitals:   07/27/23 1040  BP: 130/84  Pulse: (!) 50  Weight: 111.2 kg  Height: 6\' 1"  (1.854 m)    GEN- The patient is well appearing, alert and oriented x 3 today.   Neck - no JVD or carotid bruit noted Lungs- Clear to ausculation bilaterally, normal work of breathing Heart- Regular bradycardic rate and rhythm, no murmurs, rubs or gallops, PMI not laterally displaced Extremities- no clubbing, cyanosis, or edema Skin - no rash or ecchymosis noted   Wt Readings from Last 3 Encounters:  07/27/23 111.2 kg  06/15/23 112 kg  06/01/23 112.9 kg    EKG today demonstrates (baseline artifact due to deep brain stimulator) Vent. rate 50 BPM PR interval * ms QRS duration 116 ms QT/QTcB 526/479 ms P-R-T axes * 65 34 Junctional rhythm - appears to be sinus rhythm Septal infarct , age undetermined Abnormal ECG When compared with ECG of 01-Jun-2023 15:02, PREVIOUS ECG IS PRESENT  Echo 05/10/23:   1. Left ventricular ejection fraction, by  estimation, is 55 to 60%. The  left ventricle has normal function. The left ventricle has no regional  wall motion abnormalities. Left ventricular diastolic parameters are  indeterminate.   2. Right ventricular systolic function is normal. The right ventricular  size is normal. There is normal pulmonary artery systolic pressure. The  estimated right ventricular systolic pressure is 21.7 mmHg.   3. The mitral valve is normal in structure. Trivial mitral valve  regurgitation. No evidence of mitral stenosis.   4. The aortic valve is normal in structure. Aortic valve regurgitation is  trivial. No aortic stenosis is present.   5. The inferior vena cava is normal in size with greater than 50%  respiratory variability, suggesting right atrial pressure of 3 mmHg.   Epic records are reviewed at length  today.  CHA2DS2-VASc Score = 0  The patient's score is based upon: CHF History: 0 HTN History: 0 Diabetes History: 0 Stroke History: 0 Vascular Disease History: 0 Age Score: 0 Gender Score: 0       ASSESSMENT AND PLAN: Persistent Atrial Fibrillation (ICD10:  I48.19) The patient's CHA2DS2-VASc score is 0, indicating a 0.2% annual risk of stroke.   S/p DCCV on 05/17/23 with successful conversion to NSR.  He is currently in NSR.  Discussion of rhythm control options regarding episodes of PAF. He is bradycardic at baseline which may be prohibitive of AAD therapy; might have to consider Multaq or Tikosyn. We also discussed ablation and he would like to speak with EP regarding ablation. He will discuss options with wife and call me back on how he wishes to proceed for medication treatment. He will restart Eliquis after discussion since he is paroxysmal. He agrees and will restart Eliquis.    Will refer to EP to discuss ablation.    L[place] Bells, PA-C [facility] [address] [place], [place] [address] [phone] 07/27/2023 11:22 AM

## 2023-07-27 NOTE — Patient Instructions (Signed)
Please restart Eliquis 5 mg twice daily.   Speak to your wife about ablation and also anti-arrhythmic medication to treat your Afib. An ablation is about 6 months away so it would be recommended to consider medication as a bridge to ablation. We would have to cautiously start you on a medication as your resting heart rate is naturally low.

## 2023-07-27 NOTE — Addendum Note (Signed)
Encounter addended by: Eustace Pen, PA-C on: 07/27/2023 11:30 AM  Actions taken: Clinical Note Signed

## 2023-08-04 IMAGING — US US FNA BIOPSY THYROID 1ST LESION
1 series · 12 of 12 positions shown · non-contrast
Comparison: Prior thyroid ultrasound 09/03/2021

MEDICATIONS:
None

COMPLICATIONS:
None immediate.

INDICATION: Indeterminate thyroid nodule

EXAM:
ULTRASOUND GUIDED FINE NEEDLE ASPIRATION OF INDETERMINATE THYROID
NODULE
TECHNIQUE: Informed written consent was obtained from the patient after a
discussion of the risks, benefits and alternatives to treatment.
Questions regarding the procedure were encouraged and answered. A
timeout was performed prior to the initiation of the procedure.

[Series 2: us fna biopsy thyroid 1st lesion · 0.07mm/px · 12 acquisitions, 12 frames shown]
[im 1/12]
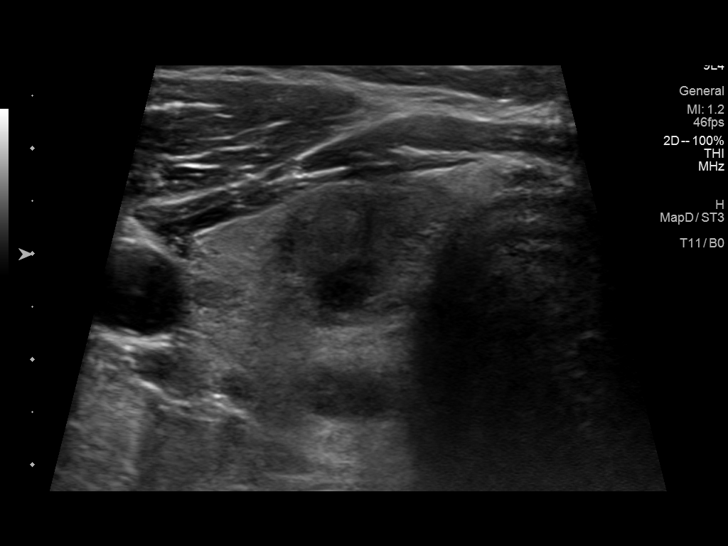
[im 2/12]
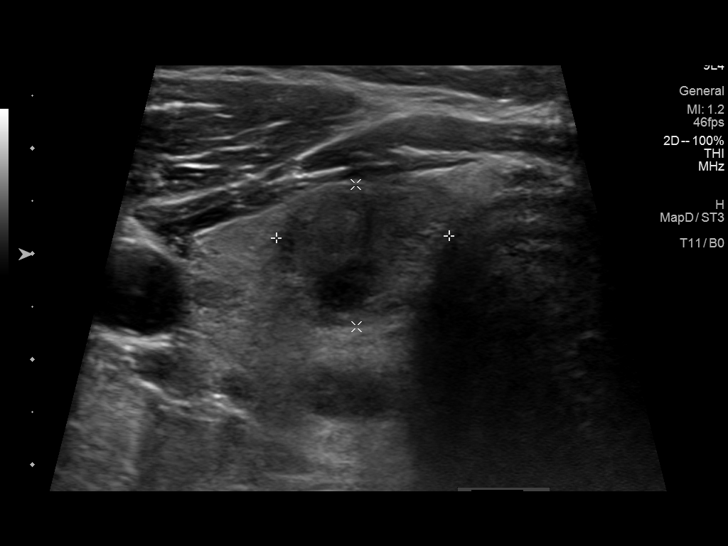
[im 3/12]
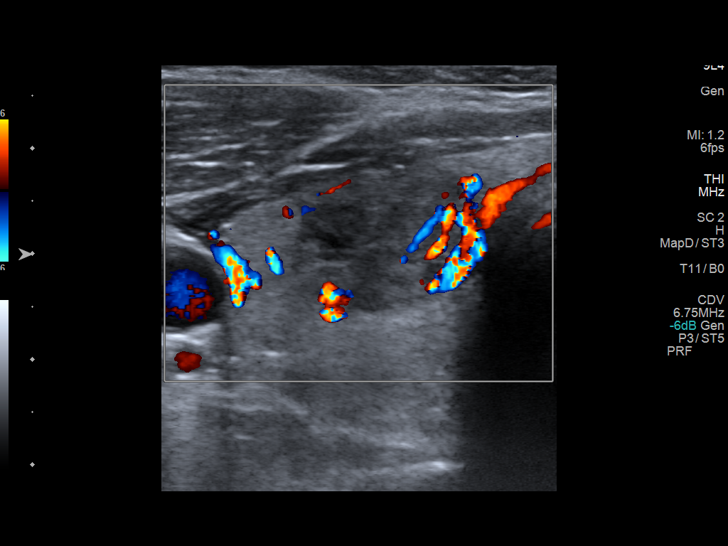
[im 4/12]
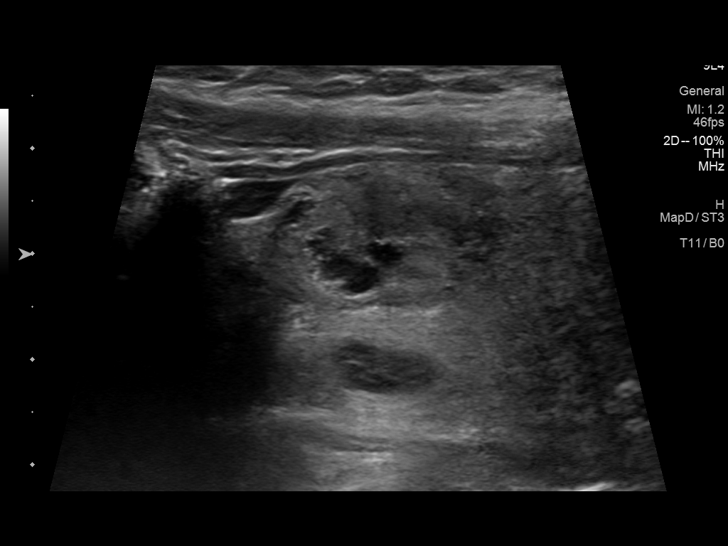
[im 5/12]
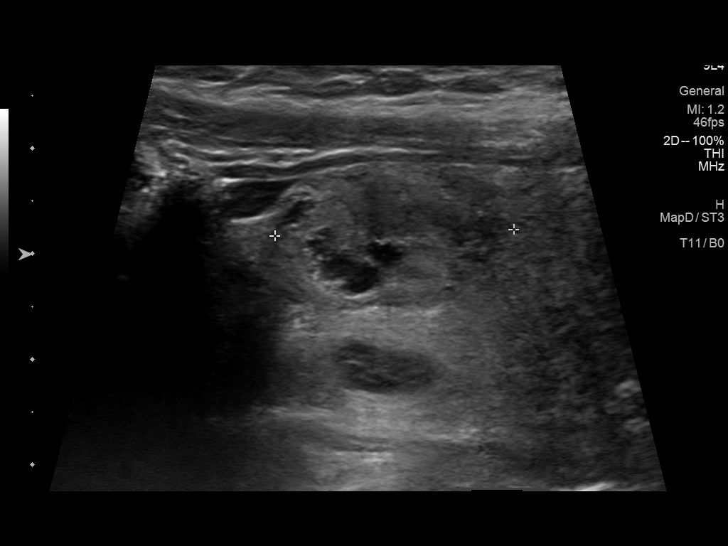
[im 6/12]
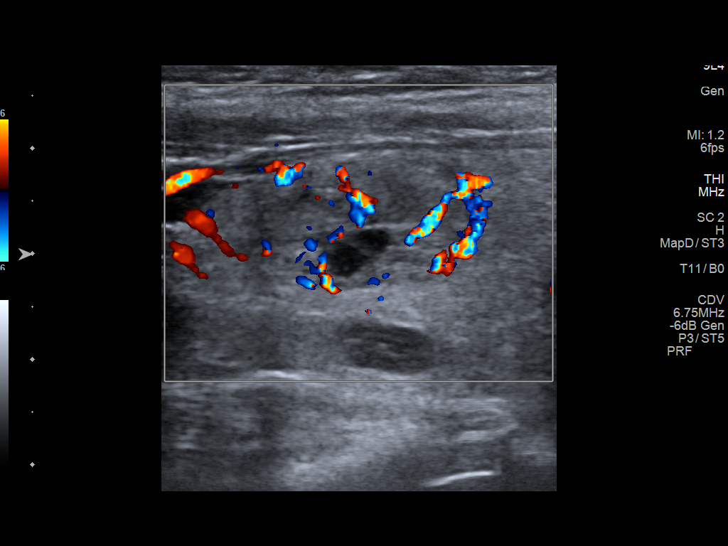
[im 7/12]
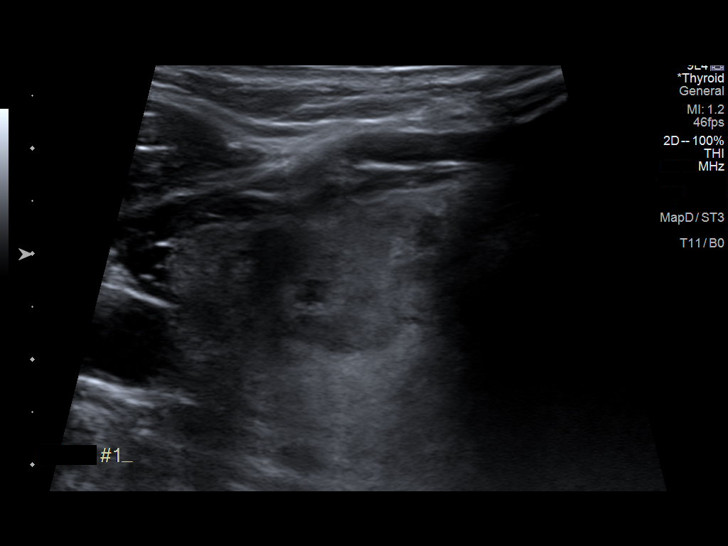
[im 8/12]
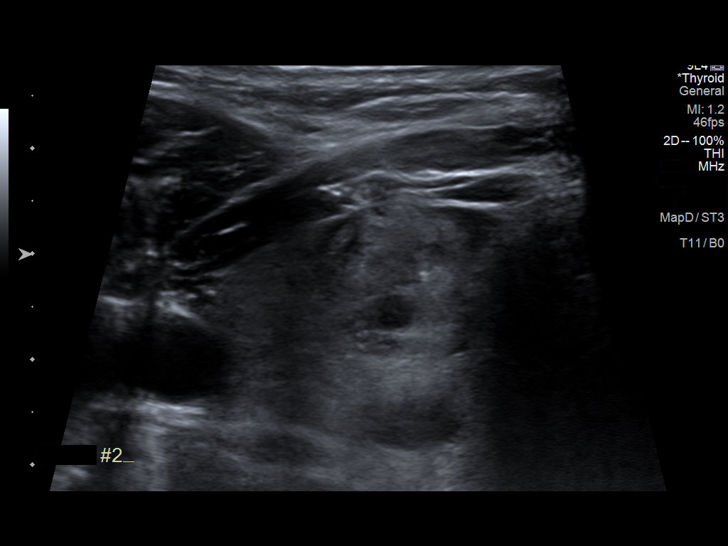
[im 9/12]
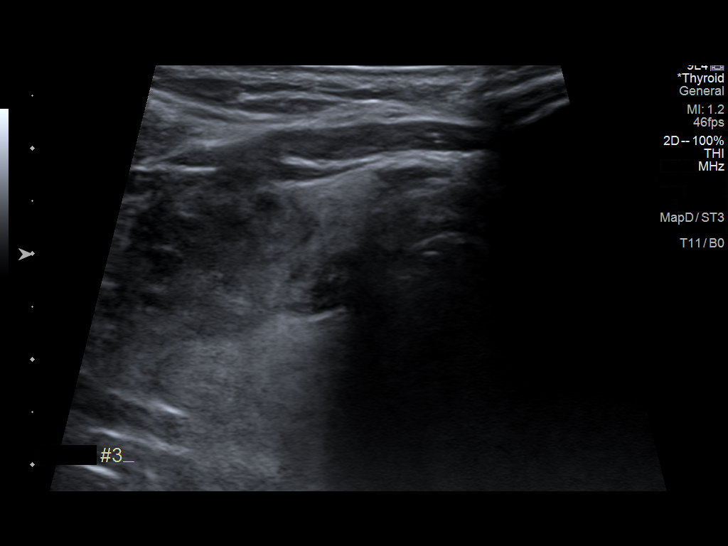
[im 10/12]
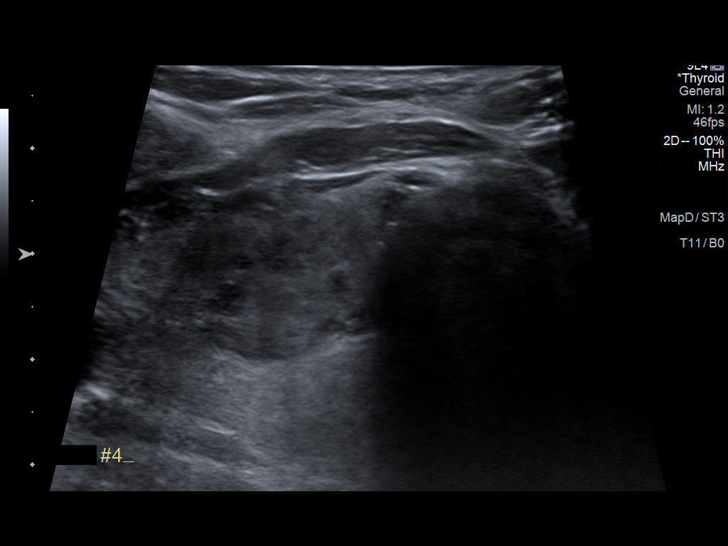
[im 11/12]
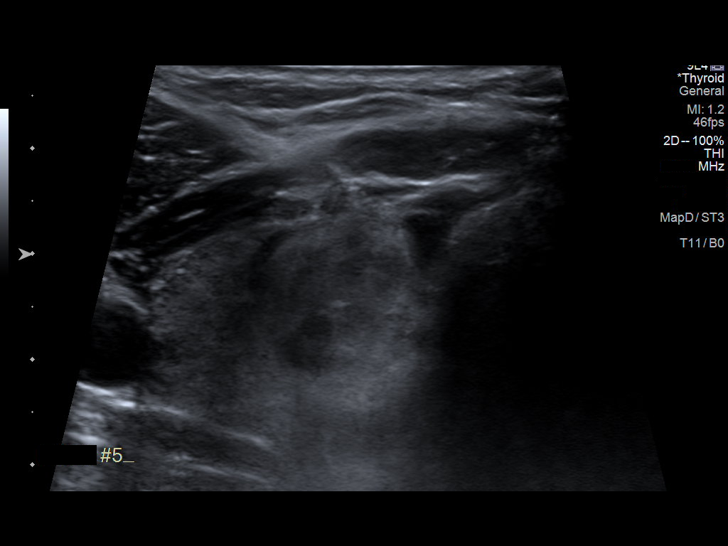
[im 12/12]
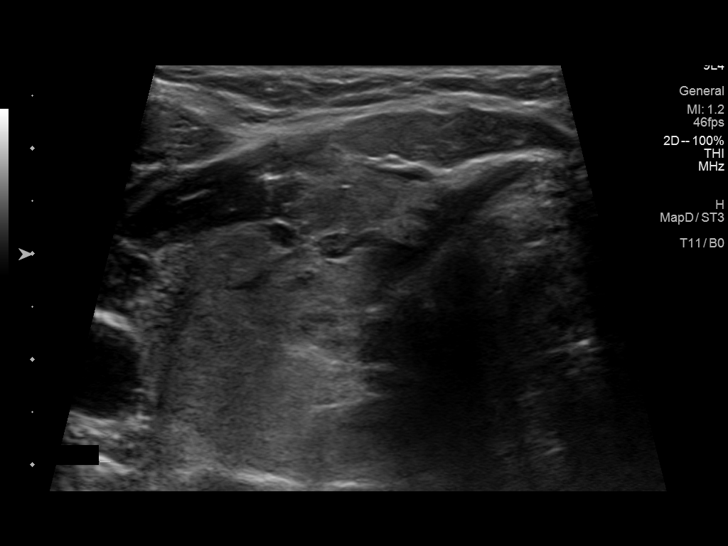

[12 of 12 positions shown; findings below may reference images not displayed]

Pre-procedural ultrasound scanning demonstrated unchanged size and
appearance of the indeterminate nodule within the right mid gland

The procedure was planned. The neck was prepped in the usual sterile
fashion, and a sterile drape was applied covering the operative
field. A timeout was performed prior to the initiation of the
procedure. Local anesthesia was provided with 1% lidocaine.

Under direct ultrasound guidance, 5 FNA biopsies were performed of
the nodule with a 25 gauge needle. Multiple ultrasound images were
saved for procedural documentation purposes. The samples were
prepared and submitted to pathology.

Limited post procedural scanning was negative for hematoma or
additional complication. Dressings were placed. The patient
tolerated the above procedures procedure well without immediate
postprocedural complication.
FINDINGS: Nodule reference number based on prior diagnostic ultrasound: 1

Maximum size: 2.3 cm

Location: Right; Mid

ACR TI-RADS risk category: TR4 (4-6 points)

Reason for biopsy: meets ACR TI-RADS criteria

Ultrasound imaging confirms appropriate placement of the needles
within the thyroid nodule.
IMPRESSION: Technically successful ultrasound guided fine needle aspiration of
right mid thyroid nodule.

## 2023-08-16 ENCOUNTER — Ambulatory Visit: Payer: 59 | Admitting: Neurology

## 2023-08-22 NOTE — H&P (View-Only) (Signed)
Electrophysiology Office Note:   Date:  08/23/2023  ID:  Antonio [name], DOB October 09, 1961, MRN [id number]  Primary Cardiologist: None Electrophysiologist: Nobie Putnam, MD      History of Present Illness:   Antonio [name] is a 62 y.o. male with h/o Parkinson's disease with deep brain stimulator who is seen today for atrial fibrillation at the request of Dr. Shelly Flatten.   Patient was first diagnosed with atrial fibrillation on 04/20/2023 during a PCP visit during which he reported feeling some fatigue for several months.  ECG at that time showed atrial fibrillation.  He underwent successful cardioversion to normal sinus rhythm on 05/17/2023. He uses a Nutritional therapist for monitoring.  He reports paroxysmal episodes of A-fib 2-3 times per week.  He had an ED visit on 07/03/2023 for dental caries and neck/tongue swelling, and was found to be in transient with RVR at that time.  Symptoms are primarily dizziness and shortness of breath during these episodes.  He recently had an episode after working outside where he reports feeling particularly unwell and his Antonio [name] device confirmed atrial fibrillation with rates in the low 100s, which for him is quite fast.  Review of systems complete and found to be negative unless listed in HPI.   EP Information / Studies Reviewed:    EKG is not ordered today. EKG from 04/20/23 reviewed which showed atrial fibrillation.   I also reviewed several of his Kardia mobile strips which help confirmed paroxysmal atrial fibrillation.  EKG 04/20/23:    EKG [facility] - 07/03/2023 3:28 PM EDT  Diagnosis Class Abnormal Acquisition Device D3K Systolic BP 171 Diastolic BP 87 Ventricular Rate 124 Atrial Rate 153 QRS Duration 108 Q-T Interval 308 QTC Calculation(Bazett) 442 Calculated R Axis 61 Calculated T Axis -47  Diagnosis Atrial fibrillation with rapid ventricular response with premature ventricular or aberrantly conducted complexes ST & T wave abnormality,  consider inferior ischemia : artifactual ST & T wave abnormality, consider anterolateral ischemia : artifactual Abnormal ECG No previous ECGs available Antonio [name] ([id number]) on 07/03/2023 3:28:57 PM certifies that he/she has reviewed the ECG tracing and confirms the independent  interpretation is correct.   Echocardiogram 05/10/2023: Normal LV size and function, LVEF 55 to 60%. Normal RV size and function. No significant valvular disease mentioned. Normal left and right atrial size.  Risk Assessment/Calculations:    CHA2DS2-VASc Score = 0   This indicates a 0.2% annual risk of stroke. The patient's score is based upon: CHF History: 0 HTN History: 0 Diabetes History: 0 Stroke History: 0 Vascular Disease History: 0 Age Score: 0 Gender Score: 0              Physical Exam:   VS:  BP 102/64 (BP Location: Left Arm, Patient Position: Sitting, Cuff Size: Normal)   Pulse (!) 54   Resp 16   Ht 6\' 1"  (1.854 m)   Wt 242 lb 9.6 oz (110 kg)   SpO2 98%   BMI 32.01 kg/m    Wt Readings from Last 3 Encounters:  08/23/23 242 lb 9.6 oz (110 kg)  07/27/23 245 lb 3.2 oz (111.2 kg)  06/15/23 247 lb (112 kg)     GEN: Well nourished, well developed in no acute distress NECK: No JVD; No carotid bruits CARDIAC: Regular rate and rhythm, no murmurs, rubs, gallops RESPIRATORY:  Clear to auscultation without rales, wheezing or rhonchi  ABDOMEN: Soft, non-tender, non-distended EXTREMITIES:  No edema; No deformity   ASSESSMENT AND PLAN:  Antonio [name] is a 62 y.o. male with h/o Parkinson's disease with deep brain stimulator who is seen today for atrial fibrillation at the request of Dr. Shelly Flatten.   #.  Paroxysmal atrial fibrillation, symptomatic: - Discussed treatment options today for AF including antiarrhythmic drug therapy and ablation. Discussed risks, recovery and likelihood of success with each treatment strategy. Risk, benefits, and alternatives to EP study and ablation for afib were  discussed. These risks include but are not limited to stroke, bleeding, vascular damage, tamponade, perforation, damage to the esophagus, lungs, phrenic nerve and other structures, pulmonary vein stenosis, worsening renal function, coronary vasospasm and death.  Discussed potential need for repeat ablation procedures and antiarrhythmic drugs after an initial ablation. The patient understands these risk and wishes to proceed.  We will therefore proceed with catheter ablation at the next available time.  Carto, ICE, anesthesia are requested for the procedure.  Will also obtain CT PV protocol prior to the procedure to exclude LAA thrombus and further evaluate atrial anatomy. -Baseline bradycardia precludes rate controlling medications.  This would also limit antiarrhythmic drug therapy options.  Additionally there is some concern about the interactions between antiarrhythmic medications with his neurologic medications that are needed for his Parkinson's disease.   #.  Secondary hypercoagulable state due to atrial fibrillation: -His CHA2DS2-VASc score is 0. -He has already been started on Eliquis by his PCP and is tolerating this without issue.  Given plans to pursue catheter ablation in the near future, we will continue Eliquis for now. -We will implant a loop recorder at the time of ablation to monitor for recurrence of atrial fibrillation.  Given that his CHA2DS2-VASc is 0 and he has had some falls related to his Parkinson's disease, if he has no further episodes of atrial fibrillation that we can stop any oral anticoagulation after a risk and benefits discussion.   Signed, Nobie Putnam, MD

## 2023-08-22 NOTE — Progress Notes (Unsigned)
Electrophysiology Office Note:   Date:  08/23/2023  ID:  Antonio [name], DOB October 09, 1961, MRN [id number]  Primary Cardiologist: None Electrophysiologist: Nobie Putnam, MD      History of Present Illness:   Antonio [name] is a 62 y.o. male with h/o Parkinson's disease with deep brain stimulator who is seen today for atrial fibrillation at the request of Dr. Shelly Flatten.   Patient was first diagnosed with atrial fibrillation on 04/20/2023 during a PCP visit during which he reported feeling some fatigue for several months.  ECG at that time showed atrial fibrillation.  He underwent successful cardioversion to normal sinus rhythm on 05/17/2023. He uses a Nutritional therapist for monitoring.  He reports paroxysmal episodes of A-fib 2-3 times per week.  He had an ED visit on 07/03/2023 for dental caries and neck/tongue swelling, and was found to be in transient with RVR at that time.  Symptoms are primarily dizziness and shortness of breath during these episodes.  He recently had an episode after working outside where he reports feeling particularly unwell and his Lourena Simmonds device confirmed atrial fibrillation with rates in the low 100s, which for him is quite fast.  Review of systems complete and found to be negative unless listed in HPI.   EP Information / Studies Reviewed:    EKG is not ordered today. EKG from 04/20/23 reviewed which showed atrial fibrillation.   I also reviewed several of his Kardia mobile strips which help confirmed paroxysmal atrial fibrillation.  EKG 04/20/23:    EKG [facility] - 07/03/2023 3:28 PM EDT  Diagnosis Class Abnormal Acquisition Device D3K Systolic BP 171 Diastolic BP 87 Ventricular Rate 124 Atrial Rate 153 QRS Duration 108 Q-T Interval 308 QTC Calculation(Bazett) 442 Calculated R Axis 61 Calculated T Axis -47  Diagnosis Atrial fibrillation with rapid ventricular response with premature ventricular or aberrantly conducted complexes ST & T wave abnormality,  consider inferior ischemia : artifactual ST & T wave abnormality, consider anterolateral ischemia : artifactual Abnormal ECG No previous ECGs available Pieter Partridge ([id number]) on 07/03/2023 3:28:57 PM certifies that he/she has reviewed the ECG tracing and confirms the independent  interpretation is correct.   Echocardiogram 05/10/2023: Normal LV size and function, LVEF 55 to 60%. Normal RV size and function. No significant valvular disease mentioned. Normal left and right atrial size.  Risk Assessment/Calculations:    CHA2DS2-VASc Score = 0   This indicates a 0.2% annual risk of stroke. The patient's score is based upon: CHF History: 0 HTN History: 0 Diabetes History: 0 Stroke History: 0 Vascular Disease History: 0 Age Score: 0 Gender Score: 0              Physical Exam:   VS:  BP 102/64 (BP Location: Left Arm, Patient Position: Sitting, Cuff Size: Normal)   Pulse (!) 54   Resp 16   Ht 6\' 1"  (1.854 m)   Wt 242 lb 9.6 oz (110 kg)   SpO2 98%   BMI 32.01 kg/m    Wt Readings from Last 3 Encounters:  08/23/23 242 lb 9.6 oz (110 kg)  07/27/23 245 lb 3.2 oz (111.2 kg)  06/15/23 247 lb (112 kg)     GEN: Well nourished, well developed in no acute distress NECK: No JVD; No carotid bruits CARDIAC: Regular rate and rhythm, no murmurs, rubs, gallops RESPIRATORY:  Clear to auscultation without rales, wheezing or rhonchi  ABDOMEN: Soft, non-tender, non-distended EXTREMITIES:  No edema; No deformity   ASSESSMENT AND PLAN:  Antonio [name] is a 62 y.o. male with h/o Parkinson's disease with deep brain stimulator who is seen today for atrial fibrillation at the request of Dr. Shelly Flatten.   #.  Paroxysmal atrial fibrillation, symptomatic: - Discussed treatment options today for AF including antiarrhythmic drug therapy and ablation. Discussed risks, recovery and likelihood of success with each treatment strategy. Risk, benefits, and alternatives to EP study and ablation for afib were  discussed. These risks include but are not limited to stroke, bleeding, vascular damage, tamponade, perforation, damage to the esophagus, lungs, phrenic nerve and other structures, pulmonary vein stenosis, worsening renal function, coronary vasospasm and death.  Discussed potential need for repeat ablation procedures and antiarrhythmic drugs after an initial ablation. The patient understands these risk and wishes to proceed.  We will therefore proceed with catheter ablation at the next available time.  Carto, ICE, anesthesia are requested for the procedure.  Will also obtain CT PV protocol prior to the procedure to exclude LAA thrombus and further evaluate atrial anatomy. -Baseline bradycardia precludes rate controlling medications.  This would also limit antiarrhythmic drug therapy options.  Additionally there is some concern about the interactions between antiarrhythmic medications with his neurologic medications that are needed for his Parkinson's disease.   #.  Secondary hypercoagulable state due to atrial fibrillation: -His CHA2DS2-VASc score is 0. -He has already been started on Eliquis by his PCP and is tolerating this without issue.  Given plans to pursue catheter ablation in the near future, we will continue Eliquis for now. -We will implant a loop recorder at the time of ablation to monitor for recurrence of atrial fibrillation.  Given that his CHA2DS2-VASc is 0 and he has had some falls related to his Parkinson's disease, if he has no further episodes of atrial fibrillation that we can stop any oral anticoagulation after a risk and benefits discussion.   Signed, Nobie Putnam, MD

## 2023-08-23 ENCOUNTER — Encounter: Payer: Self-pay | Admitting: Cardiology

## 2023-08-23 ENCOUNTER — Ambulatory Visit: Payer: 59 | Attending: Cardiology | Admitting: Cardiology

## 2023-08-23 VITALS — BP 102/64 | HR 54 | Resp 16 | Ht 73.0 in | Wt 242.6 lb

## 2023-08-23 DIAGNOSIS — I4819 Other persistent atrial fibrillation: Secondary | ICD-10-CM | POA: Diagnosis not present

## 2023-08-23 DIAGNOSIS — D6869 Other thrombophilia: Secondary | ICD-10-CM

## 2023-08-23 DIAGNOSIS — I480 Paroxysmal atrial fibrillation: Secondary | ICD-10-CM

## 2023-08-23 LAB — CBC

## 2023-08-23 NOTE — Patient Instructions (Signed)
Medication Instructions:  Your physician recommends that you continue on your current medications as directed. Please refer to the Current Medication list given to you today.  *If you need a refill on your cardiac medications before your next appointment, please call your pharmacy*  Lab Work: TODAY: BMET and CBC   Testing/Procedures: Your physician has requested that you have cardiac CT. Cardiac computed tomography (CT) is a painless test that uses an x-ray machine to take clear, detailed pictures of your heart. For further information please visit [email or web address] Please follow instruction sheet as given.  Your physician has recommended that you have an ablation. Catheter ablation is a medical procedure used to treat some cardiac arrhythmias (irregular heartbeats). During catheter ablation, a long, thin, flexible tube is put into a blood vessel in your groin (upper thigh), or neck. This tube is called an ablation catheter. It is then guided to your heart through the blood vessel. Radio frequency waves destroy small areas of heart tissue where abnormal heartbeats may cause an arrhythmia to start. Please see the instruction sheet given to you today.  Follow-Up: At [facility], you and your health needs are our priority.  As part of our continuing mission to provide you with exceptional heart care, we have created designated Provider Care Teams.  These Care Teams include your primary Cardiologist (physician) and Advanced Practice Providers (APPs -  Physician Assistants and Nurse Practitioners) who all work together to provide you with the care you need, when you need it.  Your next appointment:   See instruction letter

## 2023-08-24 LAB — CBC
Hematocrit: 43.9 % (ref 37.5–51.0)
Hemoglobin: 14.2 g/dL (ref 13.0–17.7)
MCH: 29.6 pg (ref 26.6–33.0)
MCHC: 32.3 g/dL (ref 31.5–35.7)
MCV: 92 fL (ref 79–97)
Platelets: 249 10*3/uL (ref 150–450)
RBC: 4.79 x10E6/uL (ref 4.14–5.80)
RDW: 13 % (ref 11.6–15.4)
WBC: 6.4 10*3/uL (ref 3.4–10.8)

## 2023-08-24 LAB — BASIC METABOLIC PANEL
BUN/Creatinine Ratio: 17 (ref 10–24)
BUN: 16 mg/dL (ref 8–27)
CO2: 26 mmol/L (ref 20–29)
Calcium: 9.8 mg/dL (ref 8.6–10.2)
Chloride: 103 mmol/L (ref 96–106)
Creatinine, Ser: 0.92 mg/dL (ref 0.76–1.27)
Glucose: 98 mg/dL (ref 70–99)
Potassium: 4.4 mmol/L (ref 3.5–5.2)
Sodium: 139 mmol/L (ref 134–144)
eGFR: 93 mL/min/{1.73_m2} (ref >59)

## 2023-08-31 ENCOUNTER — Ambulatory Visit (HOSPITAL_COMMUNITY)
Admission: RE | Admit: 2023-08-31 | Discharge: 2023-08-31 | Disposition: A | Discharge status: Home / Self Care | Payer: 59 | Source: Ambulatory Visit | Attending: Cardiology | Admitting: Cardiology

## 2023-08-31 DIAGNOSIS — I4819 Other persistent atrial fibrillation: Secondary | ICD-10-CM | POA: Diagnosis present

## 2023-08-31 MED ORDER — IOHEXOL 350 MG/ML SOLN
95.0000 mL | Freq: Once | INTRAVENOUS | Status: AC | PRN
  Administered 2023-08-31: 95 mL via INTRAVENOUS

## 2023-09-01 ENCOUNTER — Ambulatory Visit (HOSPITAL_COMMUNITY): Payer: 59 | Admitting: Internal Medicine

## 2023-09-03 ENCOUNTER — Telehealth: Payer: Self-pay | Admitting: Cardiology

## 2023-09-03 NOTE — Telephone Encounter (Signed)
Patient states that he needs to cancel his ablations because the insurance doesn't cover it. Please advise

## 2023-09-03 NOTE — Telephone Encounter (Signed)
Spoke with the patient and advised that his ablation has been approved however the loop recorder was denied.

## 2023-09-07 NOTE — Pre-Procedure Instructions (Signed)
Instructed patient on the following items: Arrival time 0930 Nothing to eat or drink after midnight No meds AM of procedure Responsible person to drive you home and stay with you for 24 hrs  Have you missed any doses of anti-coagulant Eliquis- takes twice a day, hasn't missed any doses.  Don't take dose in the morning.

## 2023-09-08 ENCOUNTER — Observation Stay (HOSPITAL_COMMUNITY)
Admission: RE | Admit: 2023-09-08 | Discharge: 2023-09-09 | Disposition: A | Discharge status: Home / Self Care | Payer: 59 | Attending: Cardiology | Admitting: Cardiology

## 2023-09-08 ENCOUNTER — Ambulatory Visit (HOSPITAL_BASED_OUTPATIENT_CLINIC_OR_DEPARTMENT_OTHER): Payer: 59 | Admitting: Registered Nurse

## 2023-09-08 ENCOUNTER — Ambulatory Visit (HOSPITAL_COMMUNITY)
Admission: RE | Disposition: A | Discharge status: Home / Self Care | Payer: Self-pay | Source: Home / Self Care | Attending: Cardiology

## 2023-09-08 ENCOUNTER — Other Ambulatory Visit: Payer: Self-pay

## 2023-09-08 ENCOUNTER — Ambulatory Visit (HOSPITAL_COMMUNITY): Payer: 59 | Admitting: Registered Nurse

## 2023-09-08 ENCOUNTER — Encounter (HOSPITAL_COMMUNITY): Payer: Self-pay | Admitting: Cardiology

## 2023-09-08 DIAGNOSIS — K029 Dental caries, unspecified: Secondary | ICD-10-CM | POA: Diagnosis not present

## 2023-09-08 DIAGNOSIS — I480 Paroxysmal atrial fibrillation: Secondary | ICD-10-CM

## 2023-09-08 DIAGNOSIS — Z7901 Long term (current) use of anticoagulants: Secondary | ICD-10-CM | POA: Insufficient documentation

## 2023-09-08 DIAGNOSIS — G20C Parkinsonism, unspecified: Secondary | ICD-10-CM | POA: Insufficient documentation

## 2023-09-08 DIAGNOSIS — I4891 Unspecified atrial fibrillation: Principal | ICD-10-CM | POA: Diagnosis present

## 2023-09-08 HISTORY — PX: ATRIAL FIBRILLATION ABLATION: EP1191

## 2023-09-08 SURGERY — ATRIAL FIBRILLATION ABLATION
Anesthesia: General

## 2023-09-08 MED ORDER — PROPOFOL 10 MG/ML IV BOLUS
INTRAVENOUS | Status: DC | PRN
  Administered 2023-09-08: 180 mg via INTRAVENOUS

## 2023-09-08 MED ORDER — ONDANSETRON HCL 4 MG/2ML IJ SOLN: 4.0000 mg | Freq: Four times a day (QID) | INTRAMUSCULAR | Status: DC | PRN

## 2023-09-08 MED ORDER — SODIUM CHLORIDE 0.9% FLUSH: 3.0000 mL | Freq: Two times a day (BID) | INTRAVENOUS | Status: DC

## 2023-09-08 MED ORDER — HEPARIN SODIUM (PORCINE) 1000 UNIT/ML IJ SOLN
INTRAMUSCULAR | Status: AC
  Filled 2023-09-08: qty 10

## 2023-09-08 MED ORDER — SUGAMMADEX SODIUM 200 MG/2ML IV SOLN
INTRAVENOUS | Status: DC | PRN
  Administered 2023-09-08: 217.8 mg via INTRAVENOUS

## 2023-09-08 MED ORDER — ATROPINE SULFATE 1 MG/ML IV SOLN
INTRAVENOUS | Status: DC | PRN
Start: 2023-09-08 — End: 2023-09-08
  Administered 2023-09-08: 1 mg via INTRAVENOUS

## 2023-09-08 MED ORDER — ACETAMINOPHEN 325 MG PO TABS: 650.0000 mg | ORAL_TABLET | ORAL | Status: DC | PRN

## 2023-09-08 MED ORDER — ATROPINE SULFATE 1 MG/10ML IJ SOSY
PREFILLED_SYRINGE | INTRAMUSCULAR | Status: AC
  Filled 2023-09-08: qty 10

## 2023-09-08 MED ORDER — APIXABAN 5 MG PO TABS
5.0000 mg | ORAL_TABLET | Freq: Two times a day (BID) | ORAL | Status: DC
  Administered 2023-09-08 – 2023-09-09 (×2): 5 mg via ORAL
  Filled 2023-09-08 (×2): qty 1

## 2023-09-08 MED ORDER — SODIUM CHLORIDE 0.9% FLUSH
10.0000 mL | Freq: Two times a day (BID) | INTRAVENOUS | Status: DC
  Administered 2023-09-08 – 2023-09-09 (×2): 10 mL via INTRAVENOUS

## 2023-09-08 MED ORDER — HEPARIN (PORCINE) IN NACL 1000-0.9 UT/500ML-% IV SOLN
INTRAVENOUS | Status: DC | PRN
  Administered 2023-09-08 (×3): 1000 mL

## 2023-09-08 MED ORDER — CARBIDOPA-LEVODOPA ER 50-200 MG PO TBCR
1.0000 | EXTENDED_RELEASE_TABLET | Freq: Three times a day (TID) | ORAL | Status: DC
  Administered 2023-09-08 – 2023-09-09 (×3): 1 via ORAL
  Filled 2023-09-08 (×6): qty 1

## 2023-09-08 MED ORDER — GABAPENTIN 100 MG PO CAPS
100.0000 mg | ORAL_CAPSULE | Freq: Every day | ORAL | Status: DC
  Administered 2023-09-08: 100 mg via ORAL
  Filled 2023-09-08: qty 1

## 2023-09-08 MED ORDER — RASAGILINE MESYLATE 1 MG PO TABS
1.0000 mg | ORAL_TABLET | Freq: Every evening | ORAL | Status: DC
  Administered 2023-09-08: 1 mg via ORAL
  Filled 2023-09-08 (×2): qty 1

## 2023-09-08 MED ORDER — LIDOCAINE 2% (20 MG/ML) 5 ML SYRINGE
INTRAMUSCULAR | Status: DC | PRN
  Administered 2023-09-08: 80 mg via INTRAVENOUS

## 2023-09-08 MED ORDER — SODIUM CHLORIDE 0.9% FLUSH: 3.0000 mL | INTRAVENOUS | Status: DC | PRN

## 2023-09-08 MED ORDER — ROCURONIUM BROMIDE 10 MG/ML (PF) SYRINGE
PREFILLED_SYRINGE | INTRAVENOUS | Status: DC | PRN
  Administered 2023-09-08: 10 mg via INTRAVENOUS
  Administered 2023-09-08: 70 mg via INTRAVENOUS
  Administered 2023-09-08: 30 mg via INTRAVENOUS

## 2023-09-08 MED ORDER — PRAMIPEXOLE DIHYDROCHLORIDE 1 MG PO TABS
1.0000 mg | ORAL_TABLET | Freq: Every day | ORAL | Status: DC
  Administered 2023-09-08: 1 mg via ORAL
  Filled 2023-09-08 (×2): qty 1

## 2023-09-08 MED ORDER — SODIUM CHLORIDE 0.9 % IV SOLN: INTRAVENOUS | Status: DC

## 2023-09-08 MED ORDER — FENTANYL CITRATE (PF) 250 MCG/5ML IJ SOLN
INTRAMUSCULAR | Status: DC | PRN
  Administered 2023-09-08: 100 ug via INTRAVENOUS

## 2023-09-08 MED ORDER — PHENYLEPHRINE HCL-NACL 20-0.9 MG/250ML-% IV SOLN
INTRAVENOUS | Status: DC | PRN
  Administered 2023-09-08: 30 ug/min via INTRAVENOUS

## 2023-09-08 MED ORDER — CEFAZOLIN SODIUM-DEXTROSE 2-4 GM/100ML-% IV SOLN
INTRAVENOUS | Status: AC
  Filled 2023-09-08: qty 100

## 2023-09-08 MED ORDER — DEXAMETHASONE SODIUM PHOSPHATE 10 MG/ML IJ SOLN
INTRAMUSCULAR | Status: DC | PRN
  Administered 2023-09-08: 5 mg via INTRAVENOUS

## 2023-09-08 MED ORDER — FENTANYL CITRATE (PF) 100 MCG/2ML IJ SOLN
INTRAMUSCULAR | Status: AC
  Filled 2023-09-08: qty 2

## 2023-09-08 MED ORDER — HEPARIN SODIUM (PORCINE) 1000 UNIT/ML IJ SOLN
INTRAMUSCULAR | Status: DC | PRN
Start: 2023-09-08 — End: 2023-09-08
  Administered 2023-09-08: 15000 [IU] via INTRAVENOUS
  Administered 2023-09-08: 6000 [IU] via INTRAVENOUS

## 2023-09-08 MED ORDER — CEFAZOLIN SODIUM-DEXTROSE 2-3 GM-%(50ML) IV SOLR
INTRAVENOUS | Status: DC | PRN
Start: 2023-09-08 — End: 2023-09-08
  Administered 2023-09-08: 2 g via INTRAVENOUS

## 2023-09-08 MED ORDER — PROTAMINE SULFATE 10 MG/ML IV SOLN
INTRAVENOUS | Status: DC | PRN
Start: 2023-09-08 — End: 2023-09-08
  Administered 2023-09-08: 35 mg via INTRAVENOUS

## 2023-09-08 MED ORDER — ONDANSETRON HCL 4 MG/2ML IJ SOLN
INTRAMUSCULAR | Status: DC | PRN
  Administered 2023-09-08: 4 mg via INTRAVENOUS

## 2023-09-08 SURGICAL SUPPLY — 23 items
BAG SNAP BAND KOVER 36X36 (MISCELLANEOUS) IMPLANT
BLANKET WARM UNDERBOD FULL ACC (MISCELLANEOUS) ×1 IMPLANT
CABLE PFA RX CATH CONN (CABLE) IMPLANT
CATH 8FR REPROCESSED SOUNDSTAR (CATHETERS) ×1 IMPLANT
CATH 8FR SOUNDSTAR REPROCESSED (CATHETERS) IMPLANT
CATH FARAWAVE ABLATION 31 (CATHETERS) IMPLANT
CATH OCTARAY 2.0 F 3-3-3-3-3 (CATHETERS) IMPLANT
CATH WEBSTER BI DIR CS D-F CRV (CATHETERS) IMPLANT
CLOSURE PERCLOSE PROSTYLE (VASCULAR PRODUCTS) IMPLANT
COVER SWIFTLINK CONNECTOR (BAG) ×1 IMPLANT
DEVICE CLOSURE MYNXGRIP 6/7F (Vascular Products) IMPLANT
DILATOR VESSEL 38 20CM 16FR (INTRODUCER) IMPLANT
GUIDEWIRE INQWIRE 1.5J.035X260 (WIRE) IMPLANT
INQWIRE 1.5J .035X260CM (WIRE) ×1
PACK EP LATEX FREE (CUSTOM PROCEDURE TRAY) ×1
PACK EP LF (CUSTOM PROCEDURE TRAY) ×1 IMPLANT
PAD DEFIB RADIO PHYSIO CONN (PAD) ×1 IMPLANT
PATCH CARTO3 (PAD) IMPLANT
SHEATH FARADRIVE STEERABLE (SHEATH) IMPLANT
SHEATH PINNACLE 8F 10CM (SHEATH) IMPLANT
SHEATH PINNACLE 9F 10CM (SHEATH) IMPLANT
SHEATH PROBE COVER 6X72 (BAG) IMPLANT
SHEATH WIRE KIT BAYLIS SL1 (KITS) IMPLANT

## 2023-09-08 NOTE — Anesthesia Procedure Notes (Signed)
Procedure Name: Intubation Date/Time: 09/08/2023 11:19 AM  Performed by: Loleta Charm Stenner, CRNAPre-anesthesia Checklist: Patient identified, Patient being monitored, Timeout performed, Emergency Drugs available and Suction available Patient Re-evaluated:Patient Re-evaluated prior to induction Oxygen Delivery Method: Circle system utilized Preoxygenation: Pre-oxygenation with 100% oxygen Induction Type: IV induction Ventilation: Mask ventilation without difficulty Laryngoscope Size: Mac and 4 Grade View: Grade II Tube type: Oral Tube size: 7.5 mm Number of attempts: 1 Airway Equipment and Method: Stylet Placement Confirmation: ETT inserted through vocal cords under direct vision, positive ETCO2 and breath sounds checked- equal and bilateral Secured at: 24 cm Tube secured with: Tape Dental Injury: Teeth and Oropharynx as per pre-operative assessment

## 2023-09-08 NOTE — Interval H&P Note (Signed)
History and Physical Interval Note:  09/08/2023 10:57 AM  Antonio [name]  has presented today for surgery, with the diagnosis of afib.  The various methods of treatment have been discussed with the patient and family. After consideration of risks, benefits and other options for treatment, the patient has consented to  Procedure(s): ATRIAL FIBRILLATION ABLATION (N/A) LOOP RECORDER INSERTION (N/A) as a surgical intervention.  The patient's history has been reviewed, patient examined, no change in status, stable for surgery.  I have reviewed the patient's chart and labs.  Questions were answered to the patient's satisfaction.     Nobie Putnam

## 2023-09-08 NOTE — Progress Notes (Signed)
Patient transferred to 6E-16 via bed with portable telemetry. Bedside assessment of the bilateral groin sites completed with Kathline Magic.

## 2023-09-08 NOTE — Transfer of Care (Signed)
Immediate Anesthesia Transfer of Care Note  Patient: Antonio [name]  Procedure(s) Performed: ATRIAL FIBRILLATION ABLATION  Patient Location: PACU and Cath Lab  Anesthesia Type:General  Level of Consciousness: awake  Airway & Oxygen Therapy: Patient Spontanous Breathing  Post-op Assessment: Report given to RN and Post -op Vital signs reviewed and stable  Post vital signs: Reviewed and stable  Last Vitals:  Vitals Value Taken Time  BP 135/85 (96)   Temp    Pulse 66   Resp 20   SpO2 98     Last Pain:  Vitals:   09/08/23 0952  PainSc: 0-No pain         Complications: No notable events documented.

## 2023-09-08 NOTE — Anesthesia Preprocedure Evaluation (Signed)
Anesthesia Evaluation  Patient identified by MRN, date of birth, ID band Patient awake    Reviewed: Allergy & Precautions, NPO status , Patient's Chart, lab work & pertinent test results  Airway Mallampati: II  TM Distance: >3 FB Neck ROM: Full    Dental no notable dental hx.    Pulmonary neg pulmonary ROS   Pulmonary exam normal        Cardiovascular + dysrhythmias Atrial Fibrillation  Rhythm:Irregular Rate:Normal     Neuro/Psych    Depression    Parkinson's  Neuromuscular disease    GI/Hepatic negative GI ROS, Neg liver ROS,,,  Endo/Other  negative endocrine ROS    Renal/GU negative Renal ROS  negative genitourinary   Musculoskeletal  (+) Arthritis , Osteoarthritis,    Abdominal Normal abdominal exam  (+)   Peds  Hematology Lab Results      Component                Value               Date                      WBC                      6.4                 08/23/2023                HGB                      14.2                08/23/2023                HCT                      43.9                08/23/2023                MCV                      92                  08/23/2023                PLT                      249                 08/23/2023              Anesthesia Other Findings   Reproductive/Obstetrics                             Anesthesia Physical Anesthesia Plan  ASA: 3  Anesthesia Plan: General   Post-op Pain Management:    Induction: Intravenous  PONV Risk Score and Plan: 2 and Ondansetron, Dexamethasone and Treatment may vary due to age or medical condition  Airway Management Planned: Oral ETT and Mask  Additional Equipment: None  Intra-op Plan:   Post-operative Plan: Extubation in OR  Informed Consent: I have reviewed the patients History and Physical, chart, labs and discussed the procedure including the risks, benefits and alternatives for the proposed  anesthesia  with the patient or authorized representative who has indicated his/her understanding and acceptance.     Dental advisory given  Plan Discussed with: CRNA  Anesthesia Plan Comments:        Anesthesia Quick Evaluation

## 2023-09-08 NOTE — Discharge Instructions (Signed)

## 2023-09-09 ENCOUNTER — Encounter (HOSPITAL_COMMUNITY): Payer: Self-pay | Admitting: Cardiology

## 2023-09-09 ENCOUNTER — Other Ambulatory Visit (HOSPITAL_COMMUNITY): Payer: Self-pay

## 2023-09-09 DIAGNOSIS — I480 Paroxysmal atrial fibrillation: Secondary | ICD-10-CM | POA: Diagnosis not present

## 2023-09-09 LAB — POCT ACTIVATED CLOTTING TIME
Activated Clotting Time: 238 s
Activated Clotting Time: 299 s

## 2023-09-09 MED FILL — Fentanyl Citrate Preservative Free (PF) Inj 100 MCG/2ML: INTRAMUSCULAR | Qty: 2 | Status: AC

## 2023-09-09 NOTE — Plan of Care (Signed)
Problem: Education: Goal: Understanding of disease, treatment, and recovery process will improve 09/09/2023 1148 by Herma Carson, RN Outcome: Adequate for Discharge 09/09/2023 0731 by Herma Carson, RN Outcome: Progressing   Problem: Activity: Goal: Ability to return to baseline activity level will improve 09/09/2023 1148 by Herma Carson, RN Outcome: Adequate for Discharge 09/09/2023 0731 by Herma Carson, RN Outcome: Progressing   Problem: Cardiac: Goal: Ability to maintain adequate cardiovascular perfusion will improve 09/09/2023 1148 by Herma Carson, RN Outcome: Adequate for Discharge 09/09/2023 0731 by Herma Carson, RN Outcome: Progressing Goal: Vascular access site(s) Level 0-1 will be maintained 09/09/2023 1148 by Herma Carson, RN Outcome: Adequate for Discharge 09/09/2023 0731 by Herma Carson, RN Outcome: Progressing   Problem: Health Behavior/ Discharge Planning: Goal: Ability to safely manage health related needs after discharge 09/09/2023 1148 by Herma Carson, RN Outcome: Adequate for Discharge 09/09/2023 0731 by Herma Carson, RN Outcome: Progressing   Problem: Education: Goal: Knowledge of General Education information will improve Description: Including pain rating scale, medication(s)/side effects and non-pharmacologic comfort measures 09/09/2023 1148 by Herma Carson, RN Outcome: Adequate for Discharge 09/09/2023 0731 by Herma Carson, RN Outcome: Progressing   Problem: Health Behavior/Discharge Planning: Goal: Ability to manage health-related needs will improve 09/09/2023 1148 by Herma Carson, RN Outcome: Adequate for Discharge 09/09/2023 0731 by Herma Carson, RN Outcome: Progressing   Problem: Clinical Measurements: Goal: Ability to maintain clinical measurements within normal limits will improve 09/09/2023 1148 by Herma Carson, RN Outcome: Adequate for Discharge 09/09/2023 0731 by Herma Carson,  RN Outcome: Progressing Goal: Will remain free from infection 09/09/2023 1148 by Herma Carson, RN Outcome: Adequate for Discharge 09/09/2023 0731 by Herma Carson, RN Outcome: Progressing Goal: Diagnostic test results will improve 09/09/2023 1148 by Herma Carson, RN Outcome: Adequate for Discharge 09/09/2023 0731 by Herma Carson, RN Outcome: Progressing Goal: Respiratory complications will improve 09/09/2023 1148 by Herma Carson, RN Outcome: Adequate for Discharge 09/09/2023 0731 by Herma Carson, RN Outcome: Progressing Goal: Cardiovascular complication will be avoided 09/09/2023 1148 by Herma Carson, RN Outcome: Adequate for Discharge 09/09/2023 0731 by Herma Carson, RN Outcome: Progressing   Problem: Activity: Goal: Risk for activity intolerance will decrease 09/09/2023 1148 by Herma Carson, RN Outcome: Adequate for Discharge 09/09/2023 0731 by Herma Carson, RN Outcome: Progressing   Problem: Nutrition: Goal: Adequate nutrition will be maintained 09/09/2023 1148 by Herma Carson, RN Outcome: Adequate for Discharge 09/09/2023 0731 by Herma Carson, RN Outcome: Progressing   Problem: Coping: Goal: Level of anxiety will decrease 09/09/2023 1148 by Herma Carson, RN Outcome: Adequate for Discharge 09/09/2023 0731 by Herma Carson, RN Outcome: Progressing   Problem: Elimination: Goal: Will not experience complications related to bowel motility 09/09/2023 1148 by Herma Carson, RN Outcome: Adequate for Discharge 09/09/2023 0731 by Herma Carson, RN Outcome: Progressing Goal: Will not experience complications related to urinary retention 09/09/2023 1148 by Herma Carson, RN Outcome: Adequate for Discharge 09/09/2023 0731 by Herma Carson, RN Outcome: Progressing   Problem: Pain Managment: Goal: General experience of comfort will improve 09/09/2023 1148 by Herma Carson, RN Outcome: Adequate for Discharge 09/09/2023  0731 by Herma Carson, RN Outcome: Progressing   Problem: Safety: Goal: Ability to remain free from injury will improve 09/09/2023 1148 by Herma Carson, RN Outcome: Adequate for Discharge 09/09/2023 0731 by Herma Carson, RN Outcome:  Progressing   Problem: Skin Integrity: Goal: Risk for impaired skin integrity will decrease 09/09/2023 1148 by Herma Carson, RN Outcome: Adequate for Discharge 09/09/2023 0731 by Herma Carson, RN Outcome: Progressing   Problem: Education: Goal: Understanding of post-operative needs will improve Outcome: Adequate for Discharge Goal: Individualized Educational Video(s) Outcome: Adequate for Discharge   Problem: Clinical Measurements: Goal: Postoperative complications will be avoided or minimized Outcome: Adequate for Discharge   Problem: Respiratory: Goal: Will regain and/or maintain adequate ventilation Outcome: Adequate for Discharge

## 2023-09-09 NOTE — Discharge Summary (Signed)
ELECTROPHYSIOLOGY PROCEDURE DISCHARGE SUMMARY    Patient ID: Antonio [name],  MRN: [id number], DOB/AGE: 1961/03/09 62 y.o.  Admit date: 09/08/2023 Discharge date: 09/09/2023  Primary Care Physician: Ozella Rocks, MD  Electrophysiologist: Dr. Jimmey Ralph  Primary Discharge Diagnosis:  paroxysmal Atrial fibrillation      CHA2DS2Vasc is zero, on Eliquis  Secondary Discharge Diagnosis:  Parkinson's disease  Procedures This Admission:  1.  Electrophysiology study and radiofrequency catheter ablation on by Dr Jimmey Ralph     PROCEDURES: 1. Pulmonary vein isolation (wide antral circumferential lesion set) 2. Posterior wall ablation and isolation  3. Three-dimensional electroanatomic mapping of atrial fibrillation 4. Intracardiac echocardiography 5. Transseptal puncture of an intact septum. 6. Comprehensive EP study   Brief HPI: Antonio [name] is a 62 y.o. male with a history of paroxysmal atrial fibrillation.  Referred to EP for rhythm management and planned for ablation.  Risks, benefits, and alternatives to catheter ablation of atrial fibrillation were reviewed with the patient who wished to proceed.      Hospital Course:  The patient was admitted and underwent EPS/RFCA of atrial fibrillation with details as outlined in his procedure report.  Post procedure he had a very persistent superficial oozing from his R groin procedure site requiring several additional site hold/pressure.  Lido w/epi was applied and Dr. Jimmey Ralph placed a figure * stitch for over night.  The stitch removed this morning, groin looked great, no ecchymosis, no hematoma, no recurrent bleeding.  He has been OOB and ambulated this morning without difficulty or bleeding.  Both sites are stable.  He was monitored on telemetry overnight which demonstrated SB 50's, some SR 60's.  The patient feels well today, denies CP, SOB, site discomfort.  He was examined by Dr. Jimmey Ralph and considered to be stable for discharge.  Wound care  and restrictions were reviewed with the patient.  The patient will be seen back by the Afib clinic in 4 weeks and Dr Jimmey Ralph in 12 weeks for post ablation follow up.    Physical Exam: Vitals:   09/08/23 2341 09/09/23 0419 09/09/23 0746 09/09/23 0748  BP: (!) 118/57 102/66 (!) 100/52 (!) 100/52  Pulse: 61 (!) 56 63 66  Resp: 20 19 19 16   Temp: 98.4 F (36.9 C) 98.1 F (36.7 C) 98.1 F (36.7 C)   TempSrc: Oral Oral Oral   SpO2: 96% 96%  99%  Weight:      Height:        GEN- The patient is well appearing, alert and oriented x 3 today.   HEENT: normocephalic, atraumatic; sclera clear, conjunctiva pink; hearing intact; oropharynx clear; neck supple  Lungs-  CTA b/l, normal work of breathing.  No wheezes, rales, rhonchi Heart- RRR, no murmurs, rubs or gallops  GI- soft, non-tender, non-distended, bowel sounds present  Extremities- no clubbing, cyanosis, or edema; b/l groin sites are stable without bleeding, or hematoma.   MS- no significant deformity or atrophy Skin- warm and dry, no rash or lesion Psych- euthymic mood, full affect Neuro- strength and sensation are intact   Labs:   Lab Results  Component Value Date   WBC 6.4 08/23/2023   HGB 14.2 08/23/2023   HCT 43.9 08/23/2023   MCV 92 08/23/2023   PLT 249 08/23/2023   No results for input(s): "NA", "K", "CL", "CO2", "BUN", "CREATININE", "CALCIUM", "PROT", "BILITOT", "ALKPHOS", "ALT", "AST", "GLUCOSE" in the last 168 hours.  Invalid input(s): "LABALBU"   Discharge Medications:  Allergies as of 09/09/2023  No Known Allergies      Medication List     TAKE these medications    carbidopa-levodopa 50-200 MG tablet Commonly known as: SINEMET CR TAKE 1 TABLET BY MOUTH THREE TIMES A DAY   cephALEXin 500 MG capsule Commonly known as: KEFLEX Take 1,500 mg by mouth See admin instructions. Take 3 capsules (1500 mg) by mouth prior to dental exams   Eliquis 5 MG Tabs tablet Generic drug: apixaban Take 1 tablet (5 mg  total) by mouth 2 (two) times daily.   gabapentin 100 MG capsule Commonly known as: Neurontin Take 1 capsule (100 mg total) by mouth at bedtime.   ibuprofen 200 MG tablet Commonly known as: ADVIL Take 200-400 mg by mouth every 8 (eight) hours as needed (pain.).   melatonin 5 MG Tabs Take 5 mg by mouth at bedtime.   pramipexole 1 MG tablet Commonly known as: MIRAPEX Take 1 tablet (1 mg total) by mouth at bedtime. Around 9 PM daily.   rasagiline 1 MG Tabs tablet Commonly known as: AZILECT TAKE 1 TABLET BY MOUTH EVERY DAY What changed: when to take this   valACYclovir 1000 MG tablet Commonly known as: VALTREX Take 1,000 mg by mouth 2 (two) times daily as needed (cold sores).        Disposition: Home Discharge Instructions     Diet - low sodium heart healthy   Complete by: As directed    Increase activity slowly   Complete by: As directed         Duration of Discharge Encounter: Greater than 30 minutes including physician time.  Norma Fredrickson, PA-C 09/09/2023 11:32 AM

## 2023-09-09 NOTE — Plan of Care (Signed)
Problem: Education: Goal: Understanding of disease, treatment, and recovery process will improve Outcome: Progressing   Problem: Activity: Goal: Ability to return to baseline activity level will improve Outcome: Progressing   Problem: Cardiac: Goal: Ability to maintain adequate cardiovascular perfusion will improve Outcome: Progressing Goal: Vascular access site(s) Level 0-1 will be maintained Outcome: Progressing   Problem: Health Behavior/ Discharge Planning: Goal: Ability to safely manage health related needs after discharge Outcome: Progressing   Problem: Education: Goal: Knowledge of General Education information will improve Description: Including pain rating scale, medication(s)/side effects and non-pharmacologic comfort measures Outcome: Progressing   Problem: Health Behavior/Discharge Planning: Goal: Ability to manage health-related needs will improve Outcome: Progressing   Problem: Clinical Measurements: Goal: Ability to maintain clinical measurements within normal limits will improve Outcome: Progressing Goal: Will remain free from infection Outcome: Progressing Goal: Diagnostic test results will improve Outcome: Progressing Goal: Respiratory complications will improve Outcome: Progressing Goal: Cardiovascular complication will be avoided Outcome: Progressing   Problem: Activity: Goal: Risk for activity intolerance will decrease Outcome: Progressing   Problem: Nutrition: Goal: Adequate nutrition will be maintained Outcome: Progressing   Problem: Coping: Goal: Level of anxiety will decrease Outcome: Progressing   Problem: Elimination: Goal: Will not experience complications related to bowel motility Outcome: Progressing Goal: Will not experience complications related to urinary retention Outcome: Progressing   Problem: Pain Managment: Goal: General experience of comfort will improve Outcome: Progressing   Problem: Safety: Goal: Ability to  remain free from injury will improve Outcome: Progressing   Problem: Skin Integrity: Goal: Risk for impaired skin integrity will decrease Outcome: Progressing

## 2023-09-09 NOTE — Anesthesia Postprocedure Evaluation (Signed)
Anesthesia Post Note  Patient: Antonio [name]  Procedure(s) Performed: ATRIAL FIBRILLATION ABLATION     Patient location during evaluation: PACU Anesthesia Type: General Level of consciousness: awake and alert Pain management: pain level controlled Vital Signs Assessment: post-procedure vital signs reviewed and stable Respiratory status: spontaneous breathing, nonlabored ventilation, respiratory function stable and patient connected to nasal cannula oxygen Cardiovascular status: blood pressure returned to baseline and stable Postop Assessment: no apparent nausea or vomiting Anesthetic complications: no   No notable events documented.  Last Vitals:  Vitals:   09/09/23 0746 09/09/23 0748  BP: (!) 100/52 (!) 100/52  Pulse: 63 66  Resp: 19 16  Temp: 36.7 C   SpO2:  99%    Last Pain:  Vitals:   09/09/23 0747  TempSrc:   PainSc: 0-No pain                 [name]

## 2023-09-25 ENCOUNTER — Other Ambulatory Visit: Payer: Self-pay | Admitting: Neurology

## 2023-09-25 DIAGNOSIS — G20A1 Parkinson's disease without dyskinesia, without mention of fluctuations: Secondary | ICD-10-CM

## 2023-10-06 ENCOUNTER — Encounter (HOSPITAL_COMMUNITY): Payer: Self-pay | Admitting: Physician Assistant

## 2023-10-06 ENCOUNTER — Ambulatory Visit (HOSPITAL_COMMUNITY)
Admission: RE | Admit: 2023-10-06 | Discharge: 2023-10-06 | Disposition: A | Discharge status: Home / Self Care | Payer: 59 | Source: Ambulatory Visit | Attending: Physician Assistant | Admitting: Physician Assistant

## 2023-10-06 VITALS — BP 118/82 | HR 59 | Ht 73.0 in | Wt 245.2 lb

## 2023-10-06 DIAGNOSIS — Z9682 Presence of neurostimulator: Secondary | ICD-10-CM | POA: Insufficient documentation

## 2023-10-06 DIAGNOSIS — I4819 Other persistent atrial fibrillation: Secondary | ICD-10-CM | POA: Insufficient documentation

## 2023-10-06 DIAGNOSIS — R9431 Abnormal electrocardiogram [ECG] [EKG]: Secondary | ICD-10-CM | POA: Diagnosis not present

## 2023-10-06 DIAGNOSIS — I4891 Unspecified atrial fibrillation: Secondary | ICD-10-CM | POA: Diagnosis present

## 2023-10-06 DIAGNOSIS — G20A1 Parkinson's disease without dyskinesia, without mention of fluctuations: Secondary | ICD-10-CM | POA: Insufficient documentation

## 2023-10-06 NOTE — Progress Notes (Signed)
Primary Care Physician: Ozella Rocks, MD Primary Cardiologist: None Primary Electrophysiologist: Dr Jimmey Ralph Referring Physician: Dr. Gardiner Ramus Antonio [name] is a 62 y.o. male with a history of Parkinson's disease with deep brain stimulator placed and atrial fibrillation who presents for consultation in the [facility]ibr[facility]. The patient was initially diagnosed with atrial fibrillation on 04/20/23 after presenting to PCP with symptoms of 3 months of feeling off and loss of stamina. ECG copy showed rate controlled Afib with HR 77. He was found to be in rate controlled Afib. Patient is on Eliquis 5 mg BID for a CHADS2VASC score of zero.  On evaluation today, he is in rate controlled Afib. He feels fatigued when in Afib. He has noted this symptom over the past 2-3 months. He started 2 full doses of Eliquis 5 mg BID on Thursday 5/30. He can do daily activities for the most part without issue. He has a deep brain stimulator located in upper left chest with 2 electrodes placed in his brain (right electrode ~2.5 years ago, left ~3.5 years ago). His neurologist is Dr. Rubin Payor at [facility] last seen March 2024. He had a normal sleep study done 5-6 years ago.  He is compliant with anticoagulation and has not missed any doses. He has no bleeding concerns.  On follow up 06/01/23, patient is currently in NSR. He is s/p successful DCCV on 05/17/23 with conversion to NSR. He feels well overall. He uses Kardiamobile for rhythm monitoring. He may have had a brief episode of Afib on Saturday due to overexertion in the heat. No missed doses of Eliquis.   On follow up 07/27/23, he is currently in NSR. Seen in [facility] ED on 8/10 for dental caries/swollen neck and found to be in transient Afib with RVR / possible atrial flutter. Patient tells me that via Kardiamobile device and symptoms he is noting to have paroxysmal episodes of Afib 2-3 times per week. He is able to show me on his phone  several strips indicating PAF. He feels dizzy and SOB during episodes. They are lasting several hours. He has stopped Eliquis 4 weeks after ablation.   On follow up 10/06/23. Patient is s/p afib ablation 09/08/23 with Dr Jimmey Ralph. Patient reports that he has done well since the procedure with no afib detected on his Kardia mobile. His R groin site has healed, no further oozing. He denies any chest pain.   Today, he denies symptoms of palpitations, chest pain, shortness of breath, orthopnea, PND, lower extremity edema, dizziness, presyncope, syncope, snoring, daytime somnolence, bleeding, or neurologic sequela. The patient is tolerating medications without difficulties and is otherwise without complaint today.   History of negative sleep study 08/2019.   Atrial Fibrillation Management history:  Previous antiarrhythmic drugs: None Previous cardioversions: 05/17/23 Previous ablations: 09/08/23 Anticoagulation history: Eliquis 5 mg BID   Past Medical History:  Diagnosis Date   Arthritis    Dyskinesia    occasionally    Hx of colonic polyps 12/10/2010   Leg pain left   Parkinson's disease (HCC)     Current Outpatient Medications  Medication Sig Dispense Refill   carbidopa-levodopa (SINEMET CR) 50-200 MG tablet TAKE 1 TABLET BY MOUTH THREE TIMES A DAY 90 tablet 0   cephALEXin (KEFLEX) 500 MG capsule Take 1,500 mg by mouth See admin instructions. Take 3 capsules (1500 mg) by mouth prior to dental exams     ELIQUIS 5 MG TABS tablet Take 1 tablet (5 mg total)  by mouth 2 (two) times daily. 60 tablet 3   gabapentin (NEURONTIN) 100 MG capsule Take 1 capsule (100 mg total) by mouth at bedtime. 30 capsule 5   ibuprofen (ADVIL) 200 MG tablet Take 200-400 mg by mouth every 8 (eight) hours as needed (pain.).     melatonin 5 MG TABS Take 5 mg by mouth at bedtime.     pramipexole (MIRAPEX) 1 MG tablet Take 1 tablet (1 mg total) by mouth at bedtime. Around 9 PM daily. 90 tablet 1   rasagiline (AZILECT) 1  MG TABS tablet Take 1 tablet (1 mg total) by mouth every evening. 30 tablet 5   valACYclovir (VALTREX) 1000 MG tablet Take 1,000 mg by mouth 2 (two) times daily as needed (cold sores).     No current facility-administered medications for this encounter.    No Known Allergies  ROS- All systems are reviewed and negative except as per the HPI above.  Physical Exam: Vitals:   10/06/23 1133  BP: 118/82  Pulse: (!) 59  Weight: 111.2 kg  Height: 6\' 1"  (1.854 m)    GEN: Well nourished, well developed in no acute distress NECK: No JVD; No carotid bruits CARDIAC: Regular rate and rhythm, no murmurs, rubs, gallops RESPIRATORY:  Clear to auscultation without rales, wheezing or rhonchi  ABDOMEN: Soft, non-tender, non-distended EXTREMITIES:  No edema; No deformity    Wt Readings from Last 3 Encounters:  10/06/23 111.2 kg  09/08/23 110.7 kg  08/23/23 110 kg    EKG today demonstrates (baseline artifact due to deep brain stimulator) SR, PAC Vent. rate 59 BPM PR interval * ms QRS duration 104 ms QT/QTcB 462/457 ms   Echo 05/10/23:   1. Left ventricular ejection fraction, by estimation, is 55 to 60%. The  left ventricle has normal function. The left ventricle has no regional  wall motion abnormalities. Left ventricular diastolic parameters are  indeterminate.   2. Right ventricular systolic function is normal. The right ventricular  size is normal. There is normal pulmonary artery systolic pressure. The  estimated right ventricular systolic pressure is 21.7 mmHg.   3. The mitral valve is normal in structure. Trivial mitral valve  regurgitation. No evidence of mitral stenosis.   4. The aortic valve is normal in structure. Aortic valve regurgitation is  trivial. No aortic stenosis is present.   5. The inferior vena cava is normal in size with greater than 50%  respiratory variability, suggesting right atrial pressure of 3 mmHg.   Epic records are reviewed at length  today.  CHA2DS2-VASc Score = 0  The patient's score is based upon: CHF History: 0 HTN History: 0 Diabetes History: 0 Stroke History: 0 Vascular Disease History: 0 Age Score: 0 Gender Score: 0       ASSESSMENT AND PLAN: Persistent Atrial Fibrillation (ICD10:  I48.19) The patient's CHA2DS2-VASc score is 0, indicating a 0.2% annual risk of stroke.   S/p afib ablation 09/08/23 Patient appears to be maintaining SR Continue Eliquis 5 mg BID with no missed doses for 3 months post ablation    Follow up with Dr Jimmey Ralph as scheduled.    Jorja Loa PA-C Afib Clinic [facility] [address] [place], [place] [address] [phone] 10/06/2023 11:41 AM

## 2023-11-24 ENCOUNTER — Other Ambulatory Visit: Payer: Self-pay | Admitting: Neurology

## 2023-12-01 ENCOUNTER — Encounter: Payer: Self-pay | Admitting: Cardiology

## 2023-12-02 MED ORDER — ELIQUIS 5 MG PO TABS: 5.0000 mg | ORAL_TABLET | Freq: Two times a day (BID) | ORAL | 0 refills | Status: DC

## 2023-12-15 NOTE — Progress Notes (Signed)
[name] [address] [place]. [place] [address] [phone]       OFFICE FOLLOW UP NOTE  Mr. Antonio [name] Antonio [name] Date of Birth:  11-29-1960 Medical Record Number:  [id number]    Primary neurologist: Dr. Frances Furbish Reason for visit: Parkinson's disease  Virtual Visit via Video Note  I connected with Antonio [name] on 12/20/23 at  1:45 PM EST by a video enabled telemedicine application and verified that I am speaking with the correct person using two identifiers.  Location: Patient: At home Provider: at home   I discussed the limitations of evaluation and management by telemedicine and the availability of in person appointments. The patient expressed understanding and agreed to proceed.    SUBJECTIVE:   Follow-up visit:  Prior visit: 06/15/2023 with Dr. Frances Furbish  Brief HPI:   Antonio [name] is a 63 y.o. male who is followed for right sided predominant Parkinson's disease s/p L then R DBS and RLS. PD dx dates back to 2009 with symptoms dating to around 2008. He is status post L DBS placement in 2019, generator placement in September and electrode placement in August 2019. He is status post right DBS in June 2021.  Has been on Neupro and Sinemet IR in the past but these were stopped.  At prior visit with Dr. Frances Furbish, complained of several falls, issues with RLS affecting sleep and more issues with fatigue and forgetfulness.  Recommended continuation of Sinemet CR 3 times daily but changing times to 9 AM, 1 PM and 11 PM, Azilect, and Mirapex and added gabapentin 100 mg nightly for RLS.     Interval history:  Patient returns today via MyChart video visit for follow-up visit accompanied by his wife.  Wife is concerned that his speech has continued to decline, he is slower to form words, he continues to have falls even on flat surfaces, uses cane occasionally.  Currently taking Sinemet CR 1 tab between 8 to 9 AM, 12-2pm and 10-11pm, he takes his pramipexole, Azilect and  gabapentin between 10 to 11 PM.  He does report improvement of RLS symptoms since starting gabapentin but can be bothersome in the evening times when trying to relax. Wife is frustrated that he can easily miss or be late on his afternoon dose despite setting a timer, she tries to encourage him to carry some pills in his pocket so he is able to take as soon as the timer goes off.  He is tolerating the medications without side effects.  He works with PT every Wednesday.  Plans on follow-up with [facility] neurology in March for 1 year follow-up.  Recently went on vacation with wife sleeping in the same bed and she noticed patient frequently talking and acting out his dreams, at times will fall out of bed.  They sleep in separate rooms at home.  He previously took melatonin 10 mg nightly which he believes helped some but stopped after reading about potential side effects.       ROS:   14 system review of systems performed and negative with exception of those listed in HPI  PMH:  Past Medical History:  Diagnosis Date   Arthritis    Dyskinesia    occasionally    Hx of colonic polyps 12/10/2010   Leg pain left   Parkinson's disease (HCC)     PSH:  Past Surgical History:  Procedure Laterality Date   ATRIAL FIBRILLATION ABLATION N/A 09/08/2023   Procedure: ATRIAL FIBRILLATION ABLATION;  Surgeon: Jimmey Ralph,  Ivin Booty, MD;  Location: [facility] INVASIVE CV LAB;  Service: Cardiovascular;  Laterality: N/A;   CARDIOVERSION N/A 05/17/2023   Procedure: CARDIOVERSION;  Surgeon: Lewayne Bunting, MD;  Location: [facility] INVASIVE CV LAB;  Service: Cardiovascular;  Laterality: N/A;   COLONOSCOPY     KNEE ARTHROSCOPY Right 01/2017   with Dr Charlann Boxer ;at surgical center    None listed     TOTAL KNEE ARTHROPLASTY Right 09/28/2017   Procedure: RIGHT TOTAL KNEE ARTHROPLASTY;  Surgeon: Durene Romans, MD;  Location: [facility] ORS;  Service: Orthopedics;  Laterality: Right;  70 mins   TOTAL KNEE ARTHROPLASTY Left 04/12/2018   Procedure:  LEFT TOTAL KNEE ARTHROPLASTY;  Surgeon: Durene Romans, MD;  Location: [facility] ORS;  Service: Orthopedics;  Laterality: Left;  Adductor Block    Social History:  Social History   Socioeconomic History   Marital status: Married    Spouse name: Dorene Grebe   Number of children: 3   Years of education: Ph.D   Highest education level: Not on file  Occupational History    Employer: [employer]  Tobacco Use   Smoking status: Never   Smokeless tobacco: Never   Tobacco comments:    Never smoked 07/27/23  Vaping Use   Vaping status: Never Used  Substance and Sexual Activity   Alcohol use: Yes    Alcohol/week: 2.0 standard drinks of alcohol    Types: 2 Glasses of wine per week    Comment: 2 glasses of wine weekly 07/27/23   Drug use: No   Sexual activity: Not on file  Other Topics Concern   Not on file  Social History Narrative   Patient is right handed   Patient lives at home with his wife Dorene Grebe. 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   Caffeine- coffee on occasion    Patient has a Ph.D.         Social Drivers of Corporate investment banker Strain: Not on file  Food Insecurity: No Food Insecurity (09/08/2023)   Hunger Vital Sign    Worried About Running Out of Food in the Last Year: Never true    Ran Out of Food in the Last Year: Never true  Transportation Needs: No Transportation Needs (09/08/2023)   PRAPARE - Administrator, Civil Service (Medical): No    Lack of Transportation (Non-Medical): No  Physical Activity: Not on file  Stress: Not on file  Social Connections: Unknown (07/03/2023)   [facility] from [facility]   Social Network    Social Network: Not on file  Intimate Partner Violence: Not At Risk (09/08/2023)   Humiliation, Afraid, Rape, and Kick questionnaire    Fear of Current or Ex-Partner: No    Emotionally Abused: No    Physically Abused: No    Sexually Abused: No    Family History:   Family History  Problem Relation Age of Onset   Heart disease Mother    Diabetes Father    Parkinson's disease Father    Colon cancer Neg Hx    Esophageal cancer Neg Hx    Rectal cancer Neg Hx    Stomach cancer Neg Hx     Medications:   Current Outpatient Medications on File Prior to Visit  Medication Sig Dispense Refill   carbidopa-levodopa (SINEMET CR) 50-200 MG tablet TAKE 1 TABLET BY MOUTH THREE TIMES A DAY 90 tablet 0   cephALEXin (KEFLEX) 500 MG capsule Take 1,500  mg by mouth See admin instructions. Take 3 capsules (1500 mg) by mouth prior to dental exams     ELIQUIS 5 MG TABS tablet Take 1 tablet (5 mg total) by mouth 2 (two) times daily. 60 tablet 0   gabapentin (NEURONTIN) 100 MG capsule Take 1 capsule (100 mg total) by mouth at bedtime. 30 capsule 5   ibuprofen (ADVIL) 200 MG tablet Take 200-400 mg by mouth every 8 (eight) hours as needed (pain.).     melatonin 5 MG TABS Take 5 mg by mouth at bedtime.     pramipexole (MIRAPEX) 1 MG tablet TAKE 1 TABLET (1 MG TOTAL) BY MOUTH AT BEDTIME. AROUND 9 PM DAILY. 90 tablet 1   rasagiline (AZILECT) 1 MG TABS tablet Take 1 tablet (1 mg total) by mouth every evening. 30 tablet 5   valACYclovir (VALTREX) 1000 MG tablet Take 1,000 mg by mouth 2 (two) times daily as needed (cold sores).     No current facility-administered medications on file prior to visit.    Allergies:  No Known Allergies    OBJECTIVE:  Physical Exam *Limited d/t visit type* General: well developed, well nourished, very pleasant middle-age male, seated, in no evident distress    Neurologic Exam Mental Status: Awake and fully alert. Oriented to place and time. Recent and remote memory intact. Attention span, concentration and fund of knowledge appropriate. Mood and affect appropriate.  Moderate facial masking.  Mild hypophonia.  No evidence of dysarthria or aphasia.  No facial dyskinesias are noted.        ASSESSMENT/PLAN: YIANNIS TULLOCH is a 63 y.o. year  old male with history of right sided predominant Parkinson's disease initially diagnosed in 2009 s/p L DBS 2019 and R DBS 2021.     Parkinson's disease:  RLS: Continue Sinemet CR 50-200 3 times daily -advised to take around 9 AM, 1 PM and 5 PM.  Discussed importance of taking medication consistently without missing dosages, discussed ways to help with this. Continue Azilect 1 mg daily Continues Mirapex 1 mg nightly Continue gabapentin 100 mg nightly, can consider adding evening dosage if RLS symptoms persist despite changing times of Sinemet dosage Recommend restarting melatonin 10 mg nightly to help with RBD Continue to follow with [facility] neurology for bilateral DBS Continue working with PT once weekly, use of AD as needed for fall prevention Previously tried: Neupro patch, Sinemet IR     Follow up in 6 months with Dr. Frances Furbish or call earlier if needed   CC:  PCP: Ozella Rocks, MD    I spent 40 minutes of face-to-face and non-face-to-face time with patient and wife via MyChart video visit.  This included previsit chart review, lab review, study review, order entry, electronic health record documentation, patient and wife education and discussion regarding above diagnoses and treatment plan and answered all other questions to patient and wife's satisfaction  Ihor Austin, AGNP-BC  [facility] [address] [place], [place] [address]  Phone [phone] Fax [phone] Note: This document was prepared with digital dictation and possible smart phrase technology. Any transcriptional errors that result from this process are unintentional.

## 2023-12-16 NOTE — Progress Notes (Deleted)
Electrophysiology Office Note:   Date:  12/16/2023  ID:  Antonio [name], DOB 1961/08/10, MRN [id number]  Primary Cardiologist: None Electrophysiologist: Nobie Putnam, MD      History of Present Illness:   Antonio [name] is a 63 y.o. male with h/o Parkinson's disease with deep brain stimulator who was initially seen for atrial fibrillation at the request of Dr. Shelly Flatten. Patient was first diagnosed with atrial fibrillation on 04/20/2023 during a PCP visit during which he reported feeling some fatigue for several months.  ECG at that time showed atrial fibrillation.  He underwent successful cardioversion to normal sinus rhythm on 05/17/2023. He uses a Nutritional therapist for monitoring.  He reported paroxysmal episodes of A-fib 2-3 times per week.  He had an ED visit on 07/03/2023 for dental caries and neck/tongue swelling, and was found to be in transient with RVR at that time.  Symptoms are primarily dizziness and shortness of breath during these episodes.  H  He underwent PVI and posterior wall ablation on 09/08/23. ***  Review of systems complete and found to be negative unless listed in HPI.   EP Information / Studies Reviewed:    EKG is ordered today. Personal review as below.     I also reviewed several of his Kardia mobile strips which help confirmed paroxysmal atrial fibrillation.  EKG 04/20/23:    EKG [facility] - 07/03/2023 3:28 PM EDT  Diagnosis Class Abnormal Acquisition Device D3K Systolic BP 171 Diastolic BP 87 Ventricular Rate 124 Atrial Rate 153 QRS Duration 108 Q-T Interval 308 QTC Calculation(Bazett) 442 Calculated R Axis 61 Calculated T Axis -47  Diagnosis Atrial fibrillation with rapid ventricular response with premature ventricular or aberrantly conducted complexes ST & T wave abnormality, consider inferior ischemia : artifactual ST & T wave abnormality, consider anterolateral ischemia : artifactual Abnormal ECG No previous ECGs available Antonio [name]  ([id number]) on 07/03/2023 3:28:57 PM certifies that he/she has reviewed the ECG tracing and confirms the independent  interpretation is correct.   Echocardiogram 05/10/2023: Normal LV size and function, LVEF 55 to 60%. Normal RV size and function. No significant valvular disease mentioned. Normal left and right atrial size.  Risk Assessment/Calculations:    CHA2DS2-VASc Score = 0   This indicates a 0.2% annual risk of stroke. The patient's score is based upon: CHF History: 0 HTN History: 0 Diabetes History: 0 Stroke History: 0 Vascular Disease History: 0 Age Score: 0 Gender Score: 0     No BP recorded.  {Refresh Note OR Click here to enter BP  :1}***        Physical Exam:   VS:  There were no vitals taken for this visit.   Wt Readings from Last 3 Encounters:  10/06/23 245 lb 3.2 oz (111.2 kg)  09/08/23 244 lb 0.8 oz (110.7 kg)  08/23/23 242 lb 9.6 oz (110 kg)     GEN: Well nourished, well developed in no acute distress NECK: No JVD; No carotid bruits CARDIAC: Regular rate and rhythm, no murmurs, rubs, gallops RESPIRATORY:  Clear to auscultation without rales, wheezing or rhonchi  ABDOMEN: Soft, non-tender, non-distended EXTREMITIES:  No edema; No deformity   ASSESSMENT AND PLAN:   Antonio [name] is a 63 y.o. male with h/o Parkinson's disease with deep brain stimulator who is seen today for atrial fibrillation at the request of Dr. Shelly Flatten.   #.  Paroxysmal atrial fibrillation, symptomatic: - Discussed treatment options today for AF including antiarrhythmic drug therapy and ablation.  Discussed risks, recovery and likelihood of success with each treatment strategy. Risk, benefits, and alternatives to EP study and ablation for afib were discussed. These risks include but are not limited to stroke, bleeding, vascular damage, tamponade, perforation, damage to the esophagus, lungs, phrenic nerve and other structures, pulmonary vein stenosis, worsening renal function, coronary  vasospasm and death.  Discussed potential need for repeat ablation procedures and antiarrhythmic drugs after an initial ablation. The patient understands these risk and wishes to proceed.  We will therefore proceed with catheter ablation at the next available time.  Carto, ICE, anesthesia are requested for the procedure.  Will also obtain CT PV protocol prior to the procedure to exclude LAA thrombus and further evaluate atrial anatomy. -Baseline bradycardia precludes rate controlling medications.  This would also limit antiarrhythmic drug therapy options.  Additionally there is some concern about the interactions between antiarrhythmic medications with his neurologic medications that are needed for his Parkinson's disease.   #.  Secondary hypercoagulable state due to atrial fibrillation: -His CHA2DS2-VASc score is 0. -He has already been started on Eliquis by his PCP and is tolerating this without issue.  Given plans to pursue catheter ablation in the near future, we will continue Eliquis for now. -We will implant a loop recorder at the time of ablation to monitor for recurrence of atrial fibrillation.  Given that his CHA2DS2-VASc is 0 and he has had some falls related to his Parkinson's disease, if he has no further episodes of atrial fibrillation that we can stop any oral anticoagulation after a risk and benefits discussion.   Signed, Nobie Putnam, MD

## 2023-12-17 ENCOUNTER — Ambulatory Visit: Payer: 59 | Admitting: Cardiology

## 2023-12-20 ENCOUNTER — Telehealth: Payer: 59 | Admitting: Adult Health

## 2023-12-20 ENCOUNTER — Encounter: Payer: Self-pay | Admitting: Adult Health

## 2023-12-20 DIAGNOSIS — G2581 Restless legs syndrome: Secondary | ICD-10-CM | POA: Diagnosis not present

## 2023-12-20 DIAGNOSIS — Z9689 Presence of other specified functional implants: Secondary | ICD-10-CM

## 2023-12-20 DIAGNOSIS — G20A2 Parkinson's disease without dyskinesia, with fluctuations: Secondary | ICD-10-CM

## 2023-12-20 MED ORDER — PRAMIPEXOLE DIHYDROCHLORIDE 1 MG PO TABS
1.0000 mg | ORAL_TABLET | Freq: Every day | ORAL | 1 refills | Status: DC
Start: 2023-12-20 — End: 2024-08-15

## 2023-12-20 MED ORDER — RASAGILINE MESYLATE 1 MG PO TABS
1.0000 mg | ORAL_TABLET | Freq: Every evening | ORAL | 5 refills | Status: DC
Start: 2023-12-20 — End: 2024-05-29

## 2023-12-20 MED ORDER — GABAPENTIN 100 MG PO CAPS
100.0000 mg | ORAL_CAPSULE | Freq: Every day | ORAL | 5 refills | Status: DC
Start: 2023-12-20 — End: 2024-05-29

## 2023-12-20 MED ORDER — CARBIDOPA-LEVODOPA ER 50-200 MG PO TBCR
1.0000 | EXTENDED_RELEASE_TABLET | Freq: Three times a day (TID) | ORAL | 3 refills | Status: DC
Start: 2023-12-20 — End: 2024-06-22

## 2023-12-20 NOTE — Patient Instructions (Addendum)
Restart melatonin 10mg  nightly to help with REM sleep disorder behavior  Continue Sinemet CR 1 tab 3 times daily -please take around 9 AM, 1 PM and 5 PM  Continue Azilect, Mirapex and gabapentin at current dosages  Continue routine follow-up with [facility] neurology  Continue working with PT every Wednesday    Follow-up in 6 months with Dr. Frances Furbish or call earlier if needed

## 2023-12-23 ENCOUNTER — Ambulatory Visit: Payer: 59 | Attending: Cardiology | Admitting: Cardiology

## 2023-12-23 ENCOUNTER — Encounter: Payer: Self-pay | Admitting: Cardiology

## 2023-12-23 VITALS — BP 118/60 | HR 61 | Ht 73.0 in | Wt 240.0 lb

## 2023-12-23 DIAGNOSIS — I480 Paroxysmal atrial fibrillation: Secondary | ICD-10-CM | POA: Diagnosis not present

## 2023-12-23 DIAGNOSIS — D6869 Other thrombophilia: Secondary | ICD-10-CM

## 2023-12-23 NOTE — Patient Instructions (Signed)
Medication Instructions:  Your physician has recommended you make the following change in your medication:  1) STOP taking Eliqius   *If you need a refill on your cardiac medications before your next appointment, please call your pharmacy*  Follow-Up: At [facility], you and your health needs are our priority.  As part of our continuing mission to provide you with exceptional heart care, we have created designated Provider Care Teams.  These Care Teams include your primary Cardiologist (physician) and Advanced Practice Providers (APPs -  Physician Assistants and Nurse Practitioners) who all work together to provide you with the care you need, when you need it.   Your next appointment:   6 months  Provider:   You may see Nobie Putnam, MD or one of the following Advanced Practice Providers on your designated Care Team:   Francis Dowse, [place] [address]" [place], [place] Sherie Don, NP Canary Brim, NP

## 2023-12-23 NOTE — Progress Notes (Signed)
Electrophysiology Office Note:   Date:  12/23/2023  ID:  Antonio [name], DOB 05/27/1961, MRN [id number]  Primary Cardiologist: None Electrophysiologist: Nobie Putnam, MD      History of Present Illness:   Antonio [name] is a 63 y.o. male with h/o Parkinson's disease with deep brain stimulator who was initially seen for atrial fibrillation at the request of Dr. Shelly Flatten. Patient was first diagnosed with atrial fibrillation on 04/20/2023 during a PCP visit during which he reported feeling some fatigue for several months.  ECG at that time showed atrial fibrillation.  He underwent successful cardioversion to normal sinus rhythm on 05/17/2023. He uses a Nutritional therapist for monitoring.  He reported paroxysmal episodes of A-fib 2-3 times per week.  He had an ED visit on 07/03/2023 for dental caries and neck/tongue swelling, and was found to be in transient with RVR at that time.  Symptoms are primarily dizziness and shortness of breath during these episodes.    Interval: He underwent PVI and posterior wall ablation on 09/08/23. He has done well since. No known recurrence of atrial fibrillation. Monitors with Kardia mobile device. Has been traveling. No new or acute complaints.  Review of systems complete and found to be negative unless listed in HPI.   EP Information / Studies Reviewed:    EKG is ordered today. Personal review as below.  EKG Interpretation Date/Time:  Thursday December 23 2023 16:39:19 EST Ventricular Rate:  61 PR Interval:    QRS Duration:  86 QT Interval:  434 QTC Calculation: 436 R Axis:   52  Text Interpretation: Sinus rhythm with premature atrial complexes. Nonspecific ST abnormality Artifact When compared with ECG of 06-Oct-2023 11:36,no significant change. Confirmed by Nobie Putnam [phone]) on 12/23/2023 5:07:40 PM  I also reviewed several of his Kardia mobile strips which help confirmed paroxysmal atrial fibrillation.  EKG 04/20/23:    EKG [facility] -  07/03/2023 3:28 PM EDT  Diagnosis Class Abnormal Acquisition Device D3K Systolic BP 171 Diastolic BP 87 Ventricular Rate 124 Atrial Rate 153 QRS Duration 108 Q-T Interval 308 QTC Calculation(Bazett) 442 Calculated R Axis 61 Calculated T Axis -47  Diagnosis Atrial fibrillation with rapid ventricular response with premature ventricular or aberrantly conducted complexes ST & T wave abnormality, consider inferior ischemia : artifactual ST & T wave abnormality, consider anterolateral ischemia : artifactual Abnormal ECG No previous ECGs available Pieter Partridge ([id number]) on 07/03/2023 3:28:57 PM certifies that he/she has reviewed the ECG tracing and confirms the independent  interpretation is correct.   Echocardiogram 05/10/2023: Normal LV size and function, LVEF 55 to 60%. Normal RV size and function. No significant valvular disease mentioned. Normal left and right atrial size.  Risk Assessment/Calculations:    CHA2DS2-VASc Score = 0   This indicates a 0.2% annual risk of stroke. The patient's score is based upon: CHF History: 0 HTN History: 0 Diabetes History: 0 Stroke History: 0 Vascular Disease History: 0 Age Score: 0 Gender Score: 0              Physical Exam:   VS:  BP 118/60   Pulse 61   Ht 6\' 1"  (1.854 m)   Wt 240 lb (108.9 kg)   SpO2 96%   BMI 31.66 kg/m    Wt Readings from Last 3 Encounters:  12/23/23 240 lb (108.9 kg)  10/06/23 245 lb 3.2 oz (111.2 kg)  09/08/23 244 lb 0.8 oz (110.7 kg)     GEN: Well nourished, well developed in no  acute distress NECK: No JVD CARDIAC: Normal rate and regular rhythm RESPIRATORY:  Clear to auscultation without rales, wheezing or rhonchi  ABDOMEN: Soft, non-distended EXTREMITIES:  No edema; No deformity   ASSESSMENT AND PLAN:   Antonio [name] is a 63 y.o. male with h/o Parkinson's disease with deep brain stimulator who is seen today for atrial fibrillation at the request of Dr. Shelly Flatten.   #.  Paroxysmal atrial  fibrillation, symptomatic: Ablation on 09/08/23 with no known recurrence.  #.  Secondary hypercoagulable state due to atrial fibrillation: - Continue monitoring with Kardia device. He has reliable symptoms when in atrial fibrillation. - His CHA2DS2-VASc score is 0. He has completed 3 months of anti-coagulation post ablation. Stop Eliquis.   Signed, Nobie Putnam, MD

## 2024-02-09 IMAGING — US US FNA BIOPSY THYROID 1ST LESION
1 series · 13 of 14 positions shown · non-contrast
Comparison: US 09/03/21

INDICATION: Right mid lobe thyroid nodule; 2.3 cm

Previous biopsy 11/04/21--- re biopsy per [facility]
FINAL MICROSCOPIC DIAGNOSIS:
- Atypia of undetermined significance ([REDACTED] category III)
AFIRMA: Risk of Malignancy ~4%
EXAM:
ULTRASOUND GUIDED FINE NEEDLE ASPIRATION OF INDETERMINATE THYROID
NODULE
TECHNIQUE: Informed written consent was obtained from the patient after a
discussion of the risks, benefits and alternatives to treatment.
Questions regarding the procedure were encouraged and answered. A
timeout was performed prior to the initiation of the procedure.

[Series 1: us fna biopsy thyroid 1st lesion · 0.06mm/px · 14 acquisitions, 13 frames shown]
[im 1/14]
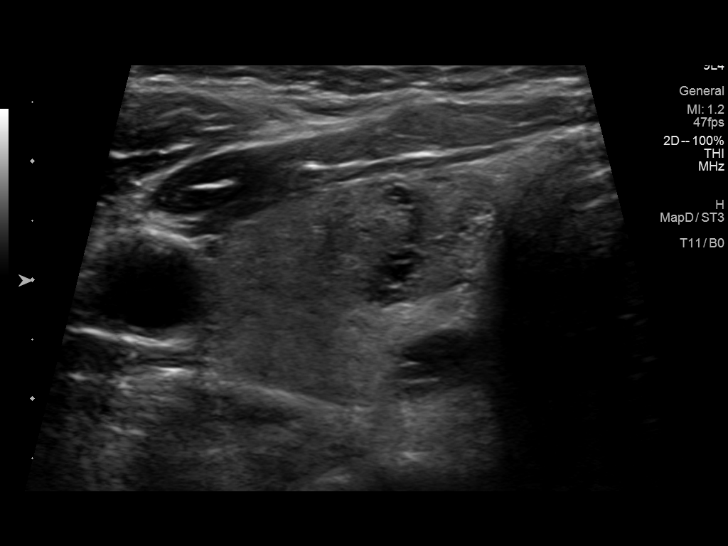
[im 2/14]
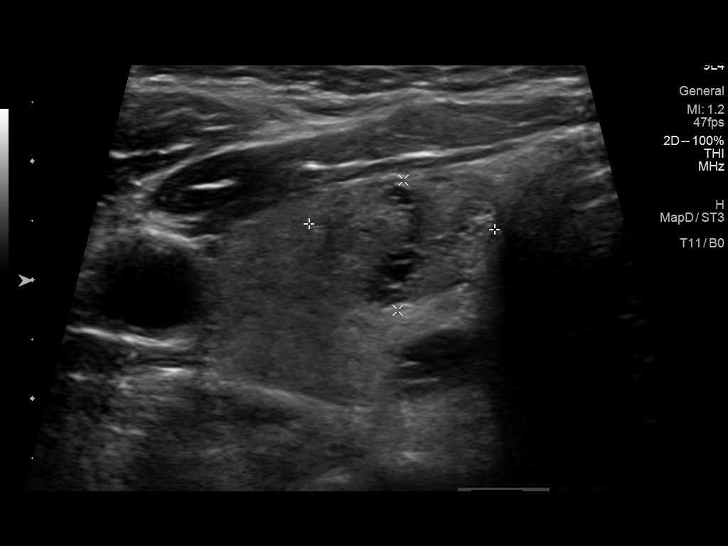
[im 3/14]
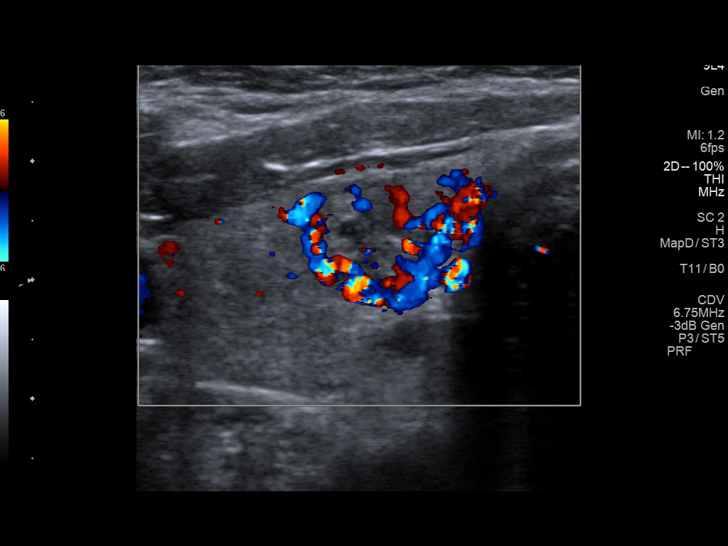
[im 4/14]
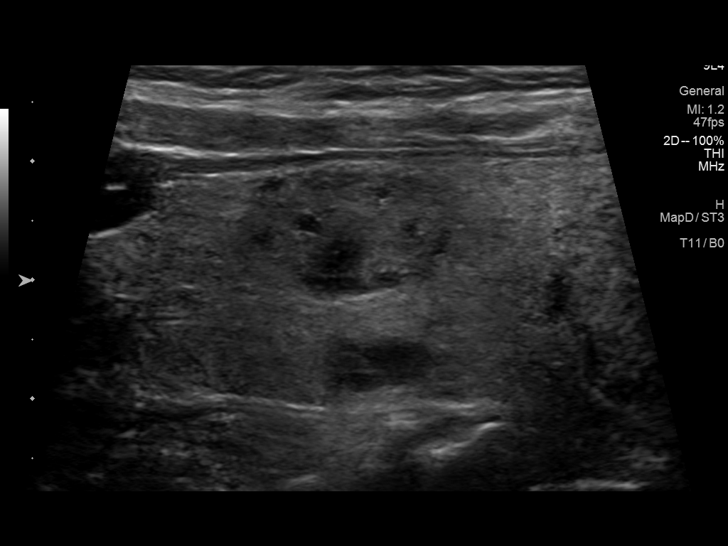
[im 5/14]
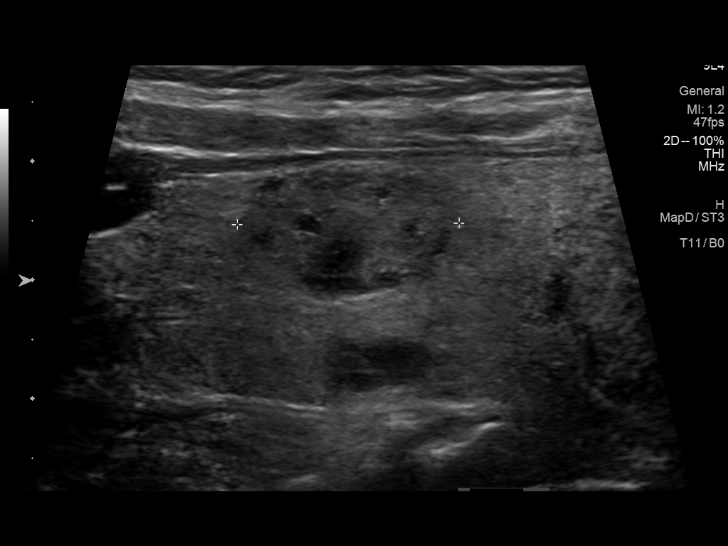
[im 6/14]
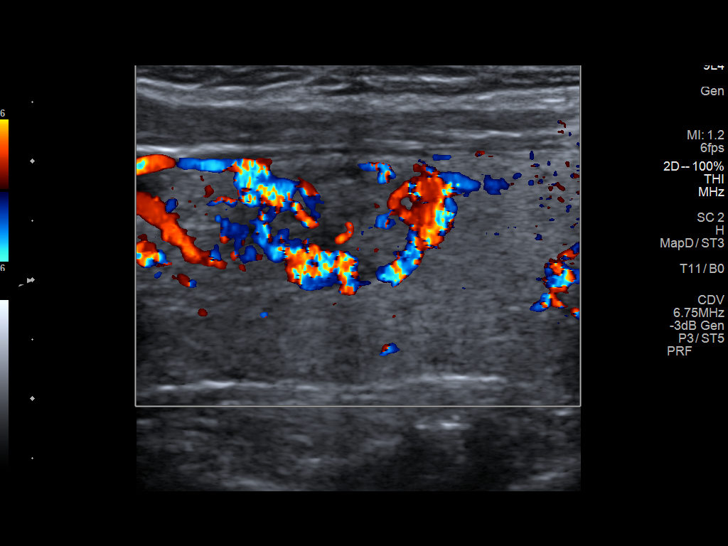
[im 8/14]
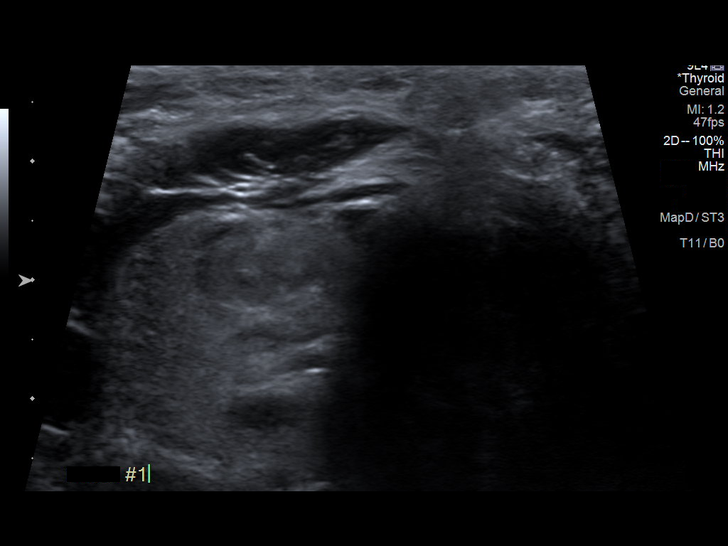
[im 9/14]
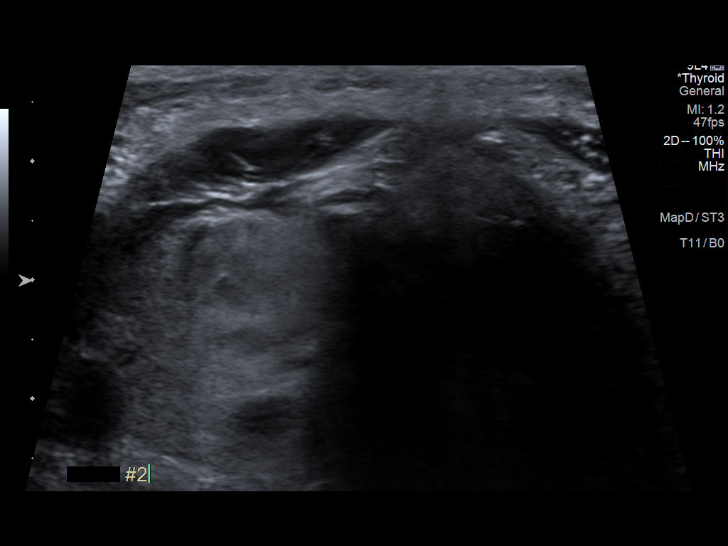
[im 10/14]
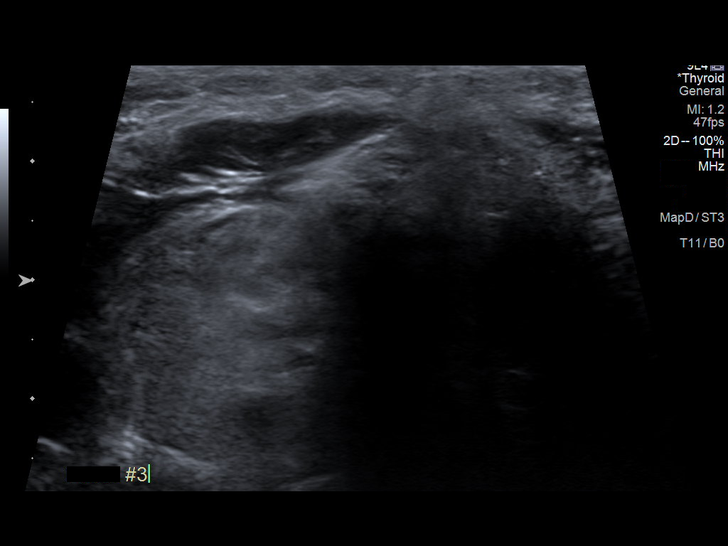
[im 11/14]
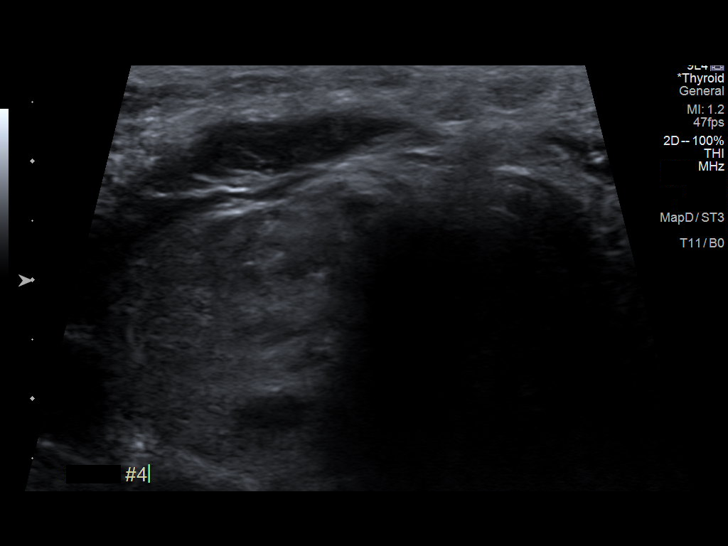
[im 12/14]
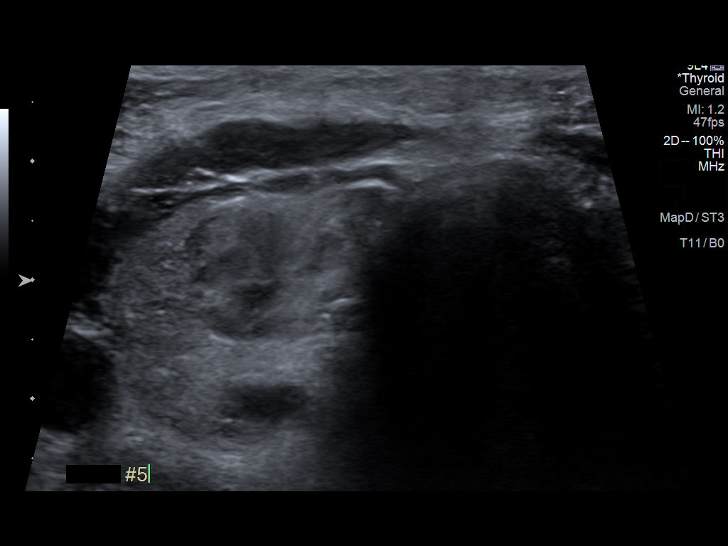
[im 13/14]
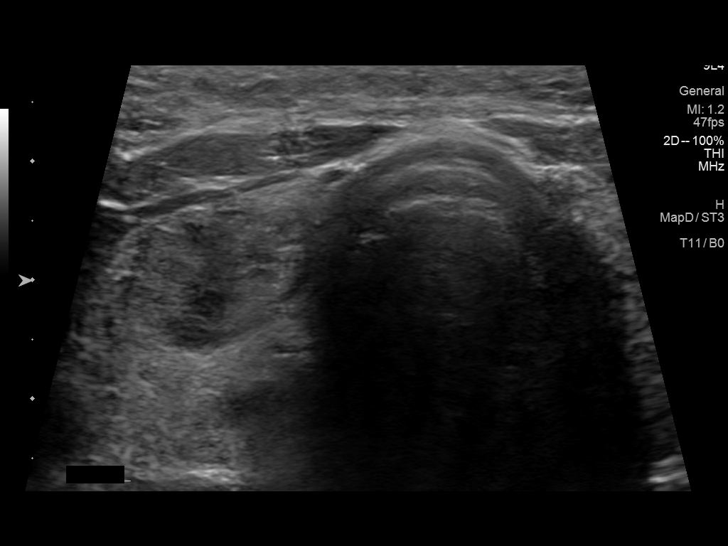
[im 14/14]
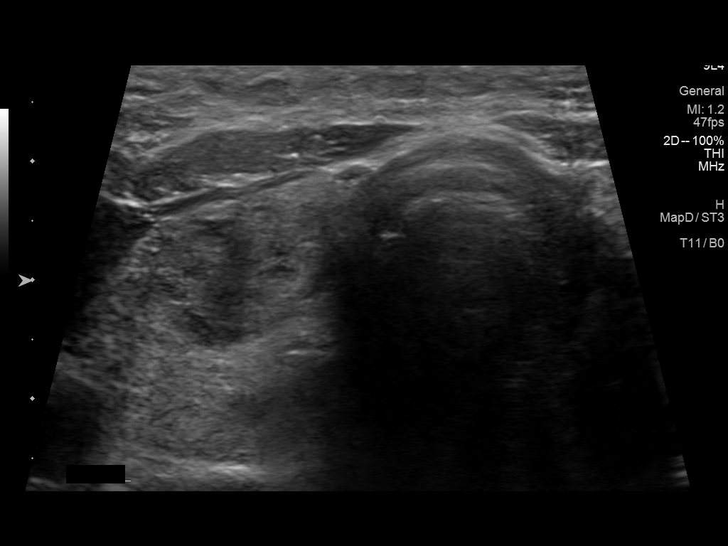

[13 of 14 positions shown; findings below may reference images not displayed]

Previous biopsy 11/04/21--- re biopsy per [facility]

FINAL MICROSCOPIC DIAGNOSIS:

- Atypia of undetermined significance ([REDACTED] category III)

AFIRMA: Risk of Malignancy ~4%

MEDICATIONS:
5 cc 1% lidocaine

COMPLICATIONS:
None immediate.
Pre-procedural ultrasound scanning demonstrated unchanged size and
appearance of the indeterminate nodule within the right thyroid

The procedure was planned. The neck was prepped in the usual sterile
fashion, and a sterile drape was applied covering the operative
field. A timeout was performed prior to the initiation of the
procedure. Local anesthesia was provided with 1% lidocaine.

Under direct ultrasound guidance, 5 FNA biopsies were performed of
the right mid lobe thyroid nodule with a 25 gauge needle.

2 of these samples were obtained for AFIRMA

Multiple ultrasound images were saved for procedural documentation
purposes. The samples were prepared and submitted to pathology.

Limited post procedural scanning was negative for hematoma or
additional complication. Dressings were placed. The patient
tolerated the above procedures procedure well without immediate
postprocedural complication.
FINDINGS: Nodule reference number based on prior diagnostic ultrasound: 1

Maximum size: 2.3 cm

Location: Right; Mid

ACR TI-RADS risk category: TR4 (4-6 points)

Reason for biopsy: patient/referrer request

Ultrasound imaging confirms appropriate placement of the needles
within the thyroid nodule.
IMPRESSION: Technically successful ultrasound guided fine needle aspiration of
right mid lobe thyroid nodule

Read by

Blondinacka Samsonaite

## 2024-05-29 ENCOUNTER — Ambulatory Visit: Payer: 59 | Admitting: Neurology

## 2024-05-29 ENCOUNTER — Encounter: Payer: Self-pay | Admitting: Neurology

## 2024-05-29 VITALS — BP 113/66 | HR 62 | Ht 73.5 in | Wt 243.8 lb

## 2024-05-29 DIAGNOSIS — R296 Repeated falls: Secondary | ICD-10-CM

## 2024-05-29 DIAGNOSIS — Z9181 History of falling: Secondary | ICD-10-CM | POA: Diagnosis not present

## 2024-05-29 DIAGNOSIS — G2581 Restless legs syndrome: Secondary | ICD-10-CM

## 2024-05-29 DIAGNOSIS — Z9689 Presence of other specified functional implants: Secondary | ICD-10-CM | POA: Diagnosis not present

## 2024-05-29 DIAGNOSIS — G20A2 Parkinson's disease without dyskinesia, with fluctuations: Secondary | ICD-10-CM

## 2024-05-29 MED ORDER — RASAGILINE MESYLATE 1 MG PO TABS
1.0000 mg | ORAL_TABLET | Freq: Every morning | ORAL | 5 refills | Status: DC
Start: 2024-05-29 — End: 2024-08-29

## 2024-05-29 MED ORDER — GABAPENTIN 100 MG PO CAPS: 100.0000 mg | ORAL_CAPSULE | Freq: Every day | ORAL | 5 refills | Status: AC

## 2024-05-29 NOTE — Patient Instructions (Addendum)
 I recommend you start using one of your walkers consistently to help slow down and control your walking speed and your foot movements. Increase your water  intake to about 4 bottles of water  per day spread throughout the day. Avoid alcohol and limit your caffeine to 1 serving per day as you are. Keep your appointment with Dr. Rosalia or his PA as planned/scheduled for October. Follow-up to see Harlene Bogaert, NP in about 6 to 8 months from now. Increase your Sinemet  CR back to 1 pill 3 times daily and take your Azilect  once daily in the morning, continue pramipexole  at bedtime, gabapentin  at bedtime and melatonin at bedtime.

## 2024-05-29 NOTE — Progress Notes (Signed)
 Subjective:    Patient ID: Antonio [name] is a 63 y.o. male.  HPI    Interim history:   Antonio [name] is a 63 year old right-handed gentleman with an underlying medical history of A-fib, status post cardioversion in June 2024, status post ablation in October 2020 for, PD (with status post bilateral DBS placement at [facility], followed by Dr. Rosalia), restless leg syndrome, and mild obesity, who presents for follow-up consultation of his right-sided predominant Parkinson's disease, s/p bilateral DBS, complicated by balance problems and recurrent falls, as well as dream enactment behavior. The patient is accompanied by his again wife today. He was last seen in our clinic by Harlene Bogaert, NP in January 2025 in a video visit, at which time he reported vivid dreams and acting out in his dreams.  He had tried melatonin in the past but was no longer on it.  He was advised to continue with his medication regimen for Parkinson's disease including Sinemet  CR 50-200 mg strength 1 pill 3 times daily, Azilect  1 mg once daily, Mirapex  1 mg nightly, and gabapentin  100 mg at bedtime.  He was advised to restart melatonin 10 mg at night.  Today, 05/29/2024: He reports recurrent falls.  He has not used a walker.  His wife feels that he just needs to slow down and when he does, he walks better.  He tends to walk fast on his tiptoes when he does not slow down.  Of note, he had A-fib ablation on 09/08/2023.  He was advised to stop taking Eliquis  subsequently.  He has been taking his levodopa  long-acting only once in the evening and also takes Azilect  in the evening.  He feels that he cut back on the levodopa  on his own about 6 months ago.  He is willing to go back up on it.  His dream enactment behavior is better since he has been consistent with his melatonin, he takes 12.5 mg at bedtime.  He still has trouble falling asleep.  He reports that he has an appointment at [facility] health with Dr. Rosalia or his associate around  October, I did not see an appointment showing up on his epic chart.  He does not always hydrate well, he estimates that he drinks about 2-3 bottles of water  per day, 1 cup of coffee in the morning, no daily soda.  He has not had alcohol on a regular basis.  His wife also feels that his short-term memory is not as good, he endorses having had some short-term memory issues.  The patient's allergies, current medications, family history, past medical history, past social history, past surgical history and problem list were reviewed and updated as appropriate.    Previously (copied from previous notes for reference):    12/20/2023 (VV, Harlene Bogaert, NP):  <<Brief HPI: [name] is a 63 y.o. male who is followed for right sided predominant Parkinson's disease s/p L then R DBS and RLS. PD dx dates back to 2009 with symptoms dating to around 2008. He is status post L DBS placement in 2019, generator placement in September and electrode placement in August 2019. He is status post right DBS in June 2021.  Has been on Neupro  and Sinemet  IR in the past but these were stopped.   At prior visit with Dr. Buck, complained of several falls, issues with RLS affecting sleep and more issues with fatigue and forgetfulness.  Recommended continuation of Sinemet  CR 3 times daily but changing times to 9 AM, 1 PM  and 11 PM, Azilect , and Mirapex  and added gabapentin  100 mg nightly for RLS.    Interval history:   Patient returns today via MyChart video visit for follow-up visit accompanied by his wife.  Wife is concerned that his speech has continued to decline, he is slower to form words, he continues to have falls even on flat surfaces, uses cane occasionally.  Currently taking Sinemet  CR 1 tab between 8 to 9 AM, 12-2pm and 10-11pm, he takes his pramipexole , Azilect  and gabapentin  between 10 to 11 PM.  He does report improvement of RLS symptoms since starting gabapentin  but can be bothersome in the evening times when trying  to relax. Wife is frustrated that he can easily miss or be late on his afternoon dose despite setting a timer, she tries to encourage him to carry some pills in his pocket so he is able to take as soon as the timer goes off.  He is tolerating the medications without side effects.  He works with PT every Wednesday.  Plans on follow-up with [facility] neurology in March for 1 year follow-up.  Recently went on vacation with wife sleeping in the same bed and she noticed patient frequently talking and acting out his dreams, at times will fall out of bed.  They sleep in separate rooms at home.  He previously took melatonin 10 mg nightly which he believes helped some but stopped after reading about potential side effects.   >>   06/15/2023 (SA): He reports having fallen several times.  His wife reports that he tends to fall when he starts moving too quickly, he has a tendency to walk on the balls of his feet rather than rolling heel-to-toe.  He is in physical therapy once a week.  He has not used a walking aid, he has had some bruising but no head injuries thankfully.  He has a bony protuberance above his left ear, he is not sure how long ago he noticed it first, he does report that a light fixture fell on top of his head recently, he sustained a bruise but this is not close to where the bony prominence is on the side of his head.  He has ongoing issues with restless leg symptoms at night and has difficulty sleeping at night.  He takes pramipexole  at bedtime.  He takes his Sinemet  CR around 8 AM, 12 and 11 PM.  He does not have a set schedule for his mealtimes.  Sometimes a skipped breakfast and sometimes a skipped lunch depending on the day.  Per wife, he has had more fatigue and forgetfulness.    I saw him on 09/02/2021, at which time he reported difficulty with his balance.  He had fallen several times.  He had more stress at work.  He was not consistent with his Sinemet  IR.   He saw Dr. Rosalia in the  interim on 12/30/2021.  He had a subsequent follow-up with Delon Devonshire, PA on 06/29/2022 and had a follow-up appointment with Dr. Rosalia on 02/10/2023.  He did not have any changes in his stimulator settings lately.  He was changed to Sinemet  CR from Sinemet  IR.    He was diagnosed with A-fib in the interim in May 2024 and started seeing cardiology in June 2024, he is status post cardioversion on 05/17/2023.     I saw him on 03/03/2021, at which time he reported doing fairly well after his right sided DBS.  He was on Sinemet  CR at bedtime and  was on Mirapex  at the time.  We restarted Azilect  at that time.  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  We talked about initiating physical therapy.   He had an interim appointment with Delon Devonshire, NP at [facility] in April 2022.   I saw him on 03/21/20, at which time he reported that his Neupro  patch was helpful, he had residual restless leg symptoms at night.  His left hand symptoms particularly his tremor has become worse and he had a consultation regarding his second DBS with Dr. Rosalia and Dr. Rosine.  He had interim right-sided DBS on 05/02/2020.    He had a neurosurgery postop visit on 06/12/2020 and a subsequent appointment with Dr. Rosalia on 07/17/2020.  He had a follow-up in programming appointment on 11/06/2020 with Dr. Rosalia, I reviewed prior notes.   He emailed in the interim on 11/30/2020 requesting a refill on his pramipexole  prescription, he was taking 1 mg at bedtime for his restless leg syndrome.  He had stopped the Neupro  patch about 2-1/2 months prior.  He reported this to his second DBS had done well and that he had been able to reduce his Sinemet .       I saw him on 12/19/2019, at which time he reported that his balance was worse.  He had had some falls without injuries.  I suggested we taper him off of Mirapex  and start him on Neupro  patch.  We increased the patch in the interim in March to 4 mg once daily.    I saw him on  07/26/2019, at which time he felt some progression of his symptoms.  He had some leg cramping at night.  He felt his sleep was interrupted, he had more daytime somnolence.  He had reduced his Mirapex  to 1 pill once daily for leg swelling.  He did increase it back up as the leg swelling stayed the same.  He had also reduced his Sinemet  to 1 pill 4 times a day.  I suggested we proceed with a sleep study.  He was also advised to increase his Sinemet  to 1 pill 5 times a day, keep his Sinemet  CR at bedtime and Azilect  once daily in the morning. He had a baseline polysomnogram on 08/27/2019 which did not show any significant sleep disordered breathing, he had mild snoring.  He had no significant PLM's.  We called him with his test results at the time.    I saw him on 01/23/2019, at which time he reported doing well.  He had a good programming appointment in 2019 in December which helped him quite a bit.  He was active physically.  He had some residual knee discomfort.  He was taking Advil as needed for this.  He had reduced his Mirapex  on his own by skipping a dose here and there.  He was taking his nighttime dose consistently because of his restless legs.  Since he had some lower extremity swelling I suggested he reduce his Mirapex  to half a pill in the morning and midday and 1 pill at night.     I saw him on 07/20/2018, at which time he was recently status post left subthalamic DBS electrode placement on 07/14/2018, with generator placement pending for September 2019, on 08/22/2018. 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 He had prior right total knee replacement. I suggested we keep his PD meds stable.      I saw him on 01/20/2018, at which time  he was fairly stable. He had stopped taking Lexapro . He had undergone right total knee replacement surgery recently and was looking at left total knee replacement. He had DBS consultation at [facility] and was supposed to see a neurosurgeon next. He had a  recent fall. Thankfully he did not injure himself. I suggested we continue his medication regimen.        I saw him on 09/22/2017, at which time he reported a fall. His right knee was bothering him. He was scheduled for right total knee replacement on 09/28/2017. He was also scheduled for a DBS consultation at [facility].   I saw him on 06/22/2017, at which time he reported more off time. He felt he was more progressed in his symptoms. His wife reported that he had fallen several times. He had more recent stressors. His wife had noted more freezing. He was more irritable and frustrated easier. He had more issues with his right knee, most likely needing total knee replacement. I suggested we increase his Sinemet  to 1 pill 5 times a day and keep his Mirapex  1 mg 3 times a day, Sinemet  CR bedtime, and keep his Azilect  the same. I suggested an opinion for DBS evaluation at [facility]. I also suggested he start taking Lexapro  5 mg strength.    I saw him on 02/16/2017, at which time he was on Sinemet  4 times a day and Sinemet  CR at bedtime. He had to have interim right knee surgery after an injury. He was still on hydrocodone  as needed for pain. 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 Lower extremity swelling was about the same, memory stable, no recent falls. No side effects from his medication. He was off of Azilect  for his surgery and was able to restarted without problems after about 2 weeks. I asked him to continue with Mirapex  3 times a day, Sinemet  CR bedtime, Sinemet  IR 4 times a day and Azilect  once daily.   I saw him on 05/12/2016, at which time he was reporting right leg pain and thigh pain. This seemed to be worse in between Sinemet  doses. He was on Sinemet  3 times a day but was not always keeping a set schedule for it. He was also taking the CR Sinemet  at night more consistently. I suggested low-dose baclofen  as needed 10 mg strength for his right leg muscle tension and  pain. I suggested we continue with Azilect  and Mirapex  at the current doses but increase his Sinemet  to 1 pill 4 times a day at 7, 11, 3 PM and 7 PM. He was also encouraged to take the CR at night.     I saw him on 01/13/16, at which time he reported feeling fairly stable. He did not continue with the CR at night as he did not notice much in the way of difference. He denied any recent restless leg symptoms. He did feel Mirapex  was helpful for that. He was taking Sinemet  3 times a day and Mirapex  3 times a day, Azilect  1 mg once daily, which was generic. He reported fall about a week prior to his last appointment. He scraped his knee.he does not always drink enough water . He had some right-sided sciatica symptoms and radiating pain to the back of his thigh.  I ordered an MRI L spine wo contrast, which he had on 01/22/16:  IMPRESSION:  This MRI of the lumbar spine without contrast shows the following: 1.    At L3-L4,  there is a midline disc protrusion and mild facet hypertrophy combining to cause borderline spinal stenosis and moderate left lateral recess stenosis there does not appear to be nerve root compression though there is some encroachment upon the traversing left L4 nerve root. 2.   There are milder degenerative changes at L2-L3, L4-L5 and L5-S1 with less potential for nerve root impingement. 3.   There are no acute findings.   We called him with the test results.    I saw him on 09/09/2015, at which time he reported that the addition of Azilect  was helpful. I suggested he continue with Sinemet  tid, Mirapex  1 mg tid, Azilect  once daily and try C/L CR at night.   I saw him on 04/24/2015, at which time the patient reported worsening balance and increase in his tremors. Unfortunately, he had fallen recently and fell backwards as he stood up from a chair and fell onto his back after which she felt sore for a couple of days but thankfully did not injure himself seriously. He did not have any head injury  or loss of consciousness. He was working 2 jobs. He was not drinking enough water . He was not sleeping as well at night. Memory was stable. He denied significant depressive symptoms. His restless legs symptoms were under control. I suggested she continue with Sinemet  and Mirapex , but I asked him to restart Azilect  which he had tried in the past. We talked about potentially pursuing DBS surgery in the future.   I first met him on 12/25/2014, at which time he reported improved parkinsonian symptoms with Mirapex  and Sinemet . I asked him to continue his medications, Mirapex  1 mg strength one pill 3 times a day and Sinemet  25-100 milligrams strength one pill 3 times a day. I suggested he could add a half pill of Sinemet  as needed in the evenings.    He previously followed with Dr. Maude Abbot was last seen by him on 07/17/2014, at which time the patient reported having stopped Sinemet  and he was using it as needed only. His Mirapex  was increased to 1 mg 3 times a day as he also reported residual restless leg symptoms. The addition of Azilect  was discussed.  He talked about DBS for future consideration.   Prior to that he was followed by Dr. Lynwood Schmitz and was last seen by Dr. Schmitz on 11/25/2011. He was originally seen by Dr. Schmitz in November 2009 at which time he presented with right-sided stiffness and fine motor dyscontrol. He has a positive family history of Parkinson's disease in his father. He was initially started on Azilect  and Mirapex  was added in 2011. He started Sinemet  25-100 milligrams strength one pill 3 times a day and eventually stop the Azilect , due to side effects reported, including cognitive SEs.   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 He is a non-smoker, he drinks wine usually on weekends.    He noted a R hand tremor in the last 6 months. He has work related stress. He sleeps well generally speaking. Confined spaces are more difficulty to  negotiate. He has mild memory issues. He has not fallen. He did not do well without the Sinemet  and has since then restarted it. He takes it 3 times a day along with one pill of Mirapex . He has not taken his midday dose yet. He has no significant side effects except for mild sleepiness. He has never fallen asleep while driving. He  has mild swelling in his feet at times especially on longer plane rides. He has to travel internationally maybe 3 times a year. His medication dose does last for 4 and sometimes 5 hours but at the end of the day especially if he's had a long work day he feels fatigued and more off. He goes to bed late. It is usually between midnight and 1 AM. He has a rise time of 7:30 AM. He snores when he sleeps on his back but does not have any gasping sensations are witnessed apneas as he recalls. His RLS symptoms are under control.     His Past Medical History Is Significant For: Past Medical History:  Diagnosis Date   Arthritis    Dyskinesia    occasionally    Hx of colonic polyps 12/10/2010   Leg pain left   Parkinson's disease (HCC)     His Past Surgical History Is Significant For: Past Surgical History:  Procedure Laterality Date   ATRIAL FIBRILLATION ABLATION N/A 09/08/2023   Procedure: ATRIAL FIBRILLATION ABLATION;  Surgeon: Kennyth Chew, MD;  Location: [facility] INVASIVE CV LAB;  Service: Cardiovascular;  Laterality: N/A;   CARDIOVERSION N/A 05/17/2023   Procedure: CARDIOVERSION;  Surgeon: Pietro Redell RAMAN, MD;  Location: [facility] INVASIVE CV LAB;  Service: Cardiovascular;  Laterality: N/A;   COLONOSCOPY     KNEE ARTHROSCOPY Right 01/2017   with Dr Ernie ;at surgical center    None listed     TOTAL KNEE ARTHROPLASTY Right 09/28/2017   Procedure: RIGHT TOTAL KNEE ARTHROPLASTY;  Surgeon: Ernie Cough, MD;  Location: [facility] ORS;  Service: Orthopedics;  Laterality: Right;  70 mins   TOTAL KNEE ARTHROPLASTY Left 04/12/2018   Procedure: LEFT TOTAL KNEE ARTHROPLASTY;  Surgeon: Ernie Cough,  MD;  Location: [facility] OR[facility];  Service: Orthopedics;  Laterality: Left;  Adductor Block    His Family History Is Significant For: Family History  Problem Relation Age of Onset   Heart disease Mother    Diabetes Father    Parkinson's disease Father    Colon cancer Neg Hx    Esophageal cancer Neg Hx    Rectal cancer Neg Hx    Stomach cancer Neg Hx     His Social History Is Significant For: Social History   Socioeconomic History   Marital status: Married    Spouse name: Laneta   Number of children: 3   Years of education: Ph.D   Highest education level: Not on file  Occupational History    Employer: [employer]  Tobacco Use   Smoking status: Never   Smokeless tobacco: Never   Tobacco comments:    Never smoked 07/27/23  Vaping Use   Vaping status: Never Used  Substance and Sexual Activity   Alcohol use: Yes    Alcohol/week: 2.0 standard drinks of alcohol    Types: 2 Glasses of wine per week    Comment: 2 glasses of wine weekly 07/27/23   Drug use: No   Sexual activity: Not on file  Other Topics Concern   Not on file  Social History Narrative   Patient is right handed   Patient lives at home with his wife Laneta. 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   Caffeine- coffee on occasion    Patient has a Ph.D.         Social Drivers of Corporate investment banker Strain: Not on file  Food Insecurity: No Food  Insecurity (09/08/2023)   Hunger Vital Sign    Worried About Running Out of Food in the Last Year: Never true    Ran Out of Food in the Last Year: Never true  Transportation Needs: No Transportation Needs (09/08/2023)   PRAPARE - Administrator, Civil Service (Medical): No    Lack of Transportation (Non-Medical): No  Physical Activity: Not on file  Stress: Not on file  Social Connections: Unknown (07/03/2023)   [facility]   Social Network    Social Network: Not on file     His Allergies Are:  No Known Allergies:   His Current Medications Are:  Outpatient Encounter Medications as of 05/29/2024  Medication Sig   gabapentin  (NEURONTIN ) 100 MG capsule Take 1 capsule (100 mg total) by mouth at bedtime.   ibuprofen (ADVIL) 200 MG tablet Take 200-400 mg by mouth every 8 (eight) hours as needed (pain.).   melatonin 5 MG TABS Take 5 mg by mouth at bedtime.   pramipexole  (MIRAPEX ) 1 MG tablet Take 1 tablet (1 mg total) by mouth at bedtime. Around 9 PM daily.   rasagiline  (AZILECT ) 1 MG TABS tablet Take 1 tablet (1 mg total) by mouth every evening.   valACYclovir  (VALTREX ) 1000 MG tablet Take 1,000 mg by mouth 2 (two) times daily as needed (cold sores).   carbidopa -levodopa  (SINEMET  CR) 50-200 MG tablet Take 1 tablet by mouth 3 (three) times daily.   cephALEXin  (KEFLEX ) 500 MG capsule Take 1,500 mg by mouth See admin instructions. Take 3 capsules (1500 mg) by mouth prior to dental exams (Patient not taking: Reported on 05/29/2024)   No facility-administered encounter medications on file as of 05/29/2024.  :  Review of Systems:  Out of a complete 14 point review of systems, all are reviewed and negative with the exception of these symptoms as listed below:  Review of Systems  Neurological:        Has multiple falls. Bruising.  Progressive decline.  Has been taking sinemet  CR only in evening. (Brought bottle says taking TID).  Has DBS controls tremors.     Objective:  Neurological Exam  Physical Exam Physical Examination:   Vitals:   05/29/24 0952  BP: 113/66  Pulse: 62    General Examination: The patient is a very pleasant 63 y.o. male in no acute distress. He appears well-developed and well-nourished and well groomed.   HEENT: Normocephalic, atraumatic, pupils are equal, round and reactive to light, corrective eyeglasses in place.  Tracking is impaired.  No obvious nystagmus seen. He has a decreased eye blink rate and moderate facial masking, mild to  moderate hypophonia, no dysarthria. There is no drooling, no nasal drainage. No facial dyskinesias are noted. Mild nuchal rigidity.  No significant neck pain. No carotid bruits.   Chest: Clear to auscultation without wheezing, rhonchi or crackles noted.   Heart: S1+S2+0, regular and normal without murmurs, rubs or gallops noted.    Abdomen: Soft, non-tender and non-distended.   Extremities: There is mild swelling noted around ankles.    Skin: Warm and dry without trophic changes noted, with chronic changes in distal legs.   Musculoskeletal: exam reveals no obvious joint discomfort.    Neurologically:  Mental status: The patient is awake, alert and oriented in all 4 spheres. His immediate and remote memory, attention, language skills and fund of knowledge are fair, history is supplemented by his wife.  Thought process is linear. Mood is normal and affect is normal.  Cranial nerves II - XII are as described above under HEENT exam.  Motor exam: Normal bulk, and strength are noted, he has fairly good range of motion, no obvious resting tremor today, overall mild to moderate bradykinesia.  No obvious dyskinesias, tone is mildly increased in the right upper extremity, fairly normal in the left upper extremity.  Overall mild to moderate difficulty with fine motor skills, no significant lateralization.   He stands up with mild to moderate difficulty and his posture is stooped for age.  Some start hesitation, turns slowly and shows insecurity with turns, walks with a slightly wider base, no walking aids.  He has decreased armswing bilaterally and walks almost with a limp.  Balance is impaired. Sensory exam is intact to light touch.  Cerebellar: no dysmetria.    Assessment and plan:    In summary, BARAN KUHRT is a 63 year old right-handed gentleman with an underlying medical history of A-fib, status post cardioversion in June 2024, status post ablation in October 2020 for, PD (with status post  bilateral DBS placement at [facility], followed by Dr. Rosalia), restless leg syndrome, and mild obesity, who presents for follow-up consultation of his right-sided predominant Parkinson's disease, s/p bilateral DBS, complicated by balance problems and recurrent falls, as well as dream enactment behavior. His PD diagnosis dates back to 2009 and symptoms date back to about 2008. He is status post L DBS placement in 2019, generator placement in September and electrode placement in August 2019. He is status post right DBS in June 2021.   Over the course of time, he has had worsening balance and recurrent falls.    He was on Neupro  in the past and immediate release Sinemet  but these were stopped.   He continues to take Mirapex  at bedtime for his restless leg syndrome.  He is advised to take it at 9 PM, about 2 hours before bedtime.  We started gabapentin  100 mg strength at bedtime in July 2024.  For his dream enactment behavior he has been taking melatonin 12.5 mg each night fairly consistently with good success and good tolerance reported.   He is advised to increase back his Sinemet  CR to 1 pill 3 times a day.  He reduced it to 1 pill once daily in the evening about 6 months ago.  We restarted his Azilect  in April 2022 and he is advised to continue with it.  He is advised to take it in the mornings.  We talked about gait safety and fall prevention at length today.  He is encouraged to start using a walker consistently.  He has 2 walkers at the house.    He had issues with interim back pain and radiculopathy, had seen orthopedics.    He had undergone epidural steroid injections into the neck.    He was found to have a thyroid  nodule for which he had another biopsy in May 2023.  Findings were not in keeping with malignancy.    He is encouraged to continue with scheduled follow-up appointments with neurology at [facility].   He is advised to follow-up in this clinic to see one of our nurse  practitioners in 6 months, sooner if needed. I answered all their questions today and the patient and his wife are in agreement.   I spent 40 minutes in total face-to-face time and in reviewing records during pre-charting, more than 50% of which was spent in counseling and coordination of care, reviewing test results, reviewing medications and treatment regimen  and/or in discussing or reviewing the diagnosis of PD, the prognosis and treatment options. Pertinent laboratory and imaging test results that were available during this visit with the patient were reviewed by me and considered in my medical decision making (see chart for details).

## 2024-06-16 ENCOUNTER — Other Ambulatory Visit: Payer: Self-pay | Admitting: Adult Health

## 2024-06-16 DIAGNOSIS — G20A2 Parkinson's disease without dyskinesia, with fluctuations: Secondary | ICD-10-CM

## 2024-08-13 ENCOUNTER — Other Ambulatory Visit: Payer: Self-pay | Admitting: Adult Health

## 2024-08-13 DIAGNOSIS — G20A2 Parkinson's disease without dyskinesia, with fluctuations: Secondary | ICD-10-CM

## 2024-08-21 ENCOUNTER — Encounter: Payer: Self-pay | Admitting: Internal Medicine

## 2024-08-26 ENCOUNTER — Other Ambulatory Visit: Payer: Self-pay | Admitting: Neurology

## 2024-08-26 DIAGNOSIS — G20A2 Parkinson's disease without dyskinesia, with fluctuations: Secondary | ICD-10-CM

## 2024-09-20 ENCOUNTER — Ambulatory Visit (AMBULATORY_SURGERY_CENTER)

## 2024-09-20 ENCOUNTER — Other Ambulatory Visit: Payer: Self-pay

## 2024-09-20 VITALS — Ht 73.5 in | Wt 240.0 lb

## 2024-09-20 DIAGNOSIS — Z860100 Personal history of colon polyps, unspecified: Secondary | ICD-10-CM

## 2024-09-20 MED ORDER — NA SULFATE-K SULFATE-MG SULF 17.5-3.13-1.6 GM/177ML PO SOLN
1.0000 | Freq: Once | ORAL | 0 refills | Status: AC
Start: 2024-09-20 — End: 2024-09-20

## 2024-09-20 NOTE — Progress Notes (Signed)
 Denies allergies to eggs or soy products. Denies complication of anesthesia or sedation. Denies use of weight loss medication. Denies use of O2.   Emmi instructions given for colonoscopy.

## 2024-09-21 ENCOUNTER — Encounter: Payer: Self-pay | Admitting: Pulmonary Disease

## 2024-09-21 ENCOUNTER — Ambulatory Visit: Attending: Internal Medicine | Admitting: Pulmonary Disease

## 2024-09-21 ENCOUNTER — Ambulatory Visit

## 2024-09-21 VITALS — BP 96/62 | HR 67 | Ht 74.0 in | Wt 240.0 lb

## 2024-09-21 DIAGNOSIS — I480 Paroxysmal atrial fibrillation: Secondary | ICD-10-CM

## 2024-09-21 NOTE — Progress Notes (Unsigned)
 Applied a 14 day Zio XT monitor to patient in the office  Kennyth to read

## 2024-09-21 NOTE — Patient Instructions (Signed)
 Medication Instructions:  Your physician recommends that you continue on your current medications as directed. Please refer to the Current Medication list given to you today.  *If you need a refill on your cardiac medications before your next appointment, please call your pharmacy*  Lab Work: None ordered If you have labs (blood work) drawn today and your tests are completely normal, you will receive your results only by: MyChart Message (if you have MyChart) OR A paper copy in the mail If you have any lab test that is abnormal or we need to change your treatment, we will call you to review the results.  Testing/Procedures: GEOFFRY HEWS- Long Term Monitor Instructions  Your physician has requested you wear a ZIO patch monitor for 14 days.  This is a single patch monitor. [employer] supplies one patch monitor per enrollment. Additional stickers are not available. Please do not apply patch if you will be having a Nuclear Stress Test,  Echocardiogram, Cardiac CT, MRI, or Chest Xray during the period you would be wearing the  monitor. The patch cannot be worn during these tests. You cannot remove and re-apply the  ZIO XT patch monitor.  Your ZIO patch monitor will be mailed 3 day USPS to your address on file. It may take 3-5 days  to receive your monitor after you have been enrolled.  Once you have received your monitor, please review the enclosed instructions. Your monitor  has already been registered assigning a specific monitor serial # to you.  Billing and Patient Assistance Program Information  We have supplied [employer] with any of your insurance information on file for billing purposes. [employer] offers a sliding scale Patient Assistance Program for patients that do not have  insurance, or whose insurance does not completely cover the cost of the ZIO monitor.  You must apply for the Patient Assistance Program to qualify for this discounted rate.  To apply, please call [employer] at [phone],  select option 4, select option 2, ask to apply for  Patient Assistance Program. Meredeth will ask your household income, and how many people  are in your household. They will quote your out-of-pocket cost based on that information.  [employer] will also be able to set up a 17-month, interest-free payment plan if needed.  Applying the monitor   Shave hair from upper left chest.  Hold abrader disc by orange tab. Rub abrader in 40 strokes over the upper left chest as  indicated in your monitor instructions.  Clean area with 4 enclosed alcohol pads. Let dry.  Apply patch as indicated in monitor instructions. Patch will be placed under collarbone on left  side of chest with arrow pointing upward.  Rub patch adhesive wings for 2 minutes. Remove white label marked 1. Remove the white  label marked 2. Rub patch adhesive wings for 2 additional minutes.  While looking in a mirror, press and release button in center of patch. A small green light will  flash 3-4 times. This will be your only indicator that the monitor has been turned on.  Do not shower for the first 24 hours. You may shower after the first 24 hours.  Press the button if you feel a symptom. You will hear a small click. Record Date, Time and  Symptom in the Patient Logbook.  When you are ready to remove the patch, follow instructions on the last 2 pages of Patient  Logbook. Stick patch monitor onto the last page of Patient Logbook.  Place Patient Logbook in the  blue and white box. Use locking tab on box and tape box closed  securely. The blue and white box has prepaid postage on it. Please place it in the mailbox as  soon as possible. Your physician should have your test results approximately 7 days after the  monitor has been mailed back to [facility].  Call [facility] Customer Care at [phone] if you have questions regarding  your ZIO XT patch monitor. Call them immediately if you see an orange light blinking on your   monitor.  If your monitor falls off in less than 4 days, contact our Monitor department at [phone].  If your monitor becomes loose or falls off after 4 days call [employer] at [phone] for  suggestions on securing your monitor   Follow-Up: At [facility], you and your health needs are our priority.  As part of our continuing mission to provide you with exceptional heart care, our providers are all part of one team.  This team includes your primary Cardiologist (physician) and Advanced Practice Providers or APPs (Physician Assistants and Nurse Practitioners) who all work together to provide you with the care you need, when you need it.  Your next appointment:   2 month(s)  Provider:   Daphne Barrack, NP

## 2024-09-21 NOTE — Progress Notes (Signed)
  Electrophysiology Office Note:   Date:  09/21/2024  ID:  Antonio [name], DOB May 26, 1961, MRN [id number]  Primary Cardiologist: None Primary Heart Failure: None Electrophysiologist: Fonda Kitty, MD      History of Present Illness:   Antonio [name] is a 63 y.o. male, works at [employer] and has a PhD in wine making, with h/o AF, Parkinson's disease s/p deep brain stimulator seen today for routine electrophysiology followup.   Since last being seen in our clinic the patient reports he has been more fatigued in the last few weeks. He is not sure if it is related to his Parkinson's or if he has been in AF. He took several readings on his [facility] and one read normal rhythm and two were indeterminate.  He has not noted palpitations or heart racing.     He denies chest pain, palpitations, dyspnea, PND, orthopnea, nausea, vomiting, dizziness, syncope, edema, weight gain, or early satiety.   Review of systems complete and found to be negative unless listed in HPI.   EP Information / Studies Reviewed:    EKG is ordered today. Personal review as below.  EKG Interpretation Date/Time:  Thursday September 21 2024 10:36:52 EDT Ventricular Rate:  66 PR Interval:    QRS Duration:  86 QT Interval:  438 QTC Calculation: 459 R Axis:   67  Text Interpretation: Sinus rhythm with Parkinson's stimulator artifact Confirmed by Aniceto Jarvis ([id number]) on 09/21/2024 11:47:15 AM    Arrhythmia / AAD / Pertinent EP Studies AF  DCCV 04/2023  EPS 09/08/23 > PVI + PW ablation    Risk Assessment/Calculations:    CHA2DS2-VASc Score = 1   This indicates a 0.6% annual risk of stroke. The patient's score is based upon: CHF History: 0 HTN History: 0 Diabetes History: 0 Stroke History: 0 Vascular Disease History: 0 Age Score: 1 Gender Score: 0              Physical Exam:   VS:  BP 96/62   Pulse 67   Ht 6' 2 (1.88 m)   Wt 240 lb (108.9 kg)   SpO2 96%   BMI 30.81 kg/m    Wt Readings from Last 3  Encounters:  09/21/24 240 lb (108.9 kg)  09/20/24 240 lb (108.9 kg)  05/29/24 243 lb 12.8 oz (110.6 kg)     GEN: Well nourished, well developed in no acute distress NECK: No JVD; No carotid bruits CARDIAC: Regular rate and rhythm, no murmurs, rubs, gallops RESPIRATORY:  Clear to auscultation without rales, wheezing or rhonchi  ABDOMEN: Soft, non-tender, non-distended EXTREMITIES:  No edema; No deformity   ASSESSMENT AND PLAN:    Paroxysmal Atrial Fibrillation  CHA2DS2-VASc 1 (age), s/p PVI + PW ablation 08/2023  -no clear symptom burden > has had fatigue -EKG with NSR and Parkinson's stimulator artifact -not on OAC due due to low risk score post ablation  -given fatigue, will assess cardiac monitor to review for recurrent AF  -encouraged him to discuss fatigue with Neurology as well   Parkinson's Disease  S/p Neuro Stimulator  -per Neurology     Follow up with Dr. Kitty / EP APP 2 months for cardiac monitor review    Signed, Jarvis Aniceto, NP-C, AGACNP-BC [facility] - Electrophysiology  09/21/2024, 11:49 AM

## 2024-09-27 ENCOUNTER — Encounter: Payer: Self-pay | Admitting: Internal Medicine

## 2024-10-03 NOTE — Progress Notes (Unsigned)
 [name] History and Physical   Primary Care Physician:  Remonia Alm PARAS, MD   Reason for Procedure:    Encounter Diagnosis  Name Primary?   Hx of colonic polyps Yes     Plan:    colonoscopy     HPI: Antonio [name] is a 63 y.o. male here for surveillance colonoscopy  2012 5 mm left colon serrated adenoma (? Traditional serrated adenoma) 09/08/2016 no polyps recall 2022 Past Medical History:  Diagnosis Date   Arthritis    Dyskinesia    occasionally    Hx of colonic polyps 12/10/2010   Leg pain left   Neuromuscular disorder (HCC)    Parkinson's disease (HCC)     Past Surgical History:  Procedure Laterality Date   ATRIAL FIBRILLATION ABLATION N/A 09/08/2023   Procedure: ATRIAL FIBRILLATION ABLATION;  Surgeon: Kennyth Chew, MD;  Location: [facility] INVASIVE CV LAB;  Service: Cardiovascular;  Laterality: N/A;   CARDIOVERSION N/A 05/17/2023   Procedure: CARDIOVERSION;  Surgeon: Pietro Redell RAMAN, MD;  Location: [facility] INVASIVE CV LAB;  Service: Cardiovascular;  Laterality: N/A;   COLONOSCOPY     deep brain stimulation     06/2018/ and second 2021   KNEE ARTHROSCOPY Right 01/2017   with Dr Ernie ;at surgical center    None listed     TOTAL KNEE ARTHROPLASTY Right 09/28/2017   Procedure: RIGHT TOTAL KNEE ARTHROPLASTY;  Surgeon: Ernie Cough, MD;  Location: [facility] ORS;  Service: Orthopedics;  Laterality: Right;  70 mins   TOTAL KNEE ARTHROPLASTY Left 04/12/2018   Procedure: LEFT TOTAL KNEE ARTHROPLASTY;  Surgeon: Ernie Cough, MD;  Location: [facility] ORS;  Service: Orthopedics;  Laterality: Left;  Adductor Block     Current Outpatient Medications  Medication Sig Dispense Refill   carbidopa -levodopa  (SINEMET  CR) 50-200 MG tablet TAKE 1 TABLET BY MOUTH THREE TIMES A DAY 270 tablet 3   cephALEXin  (KEFLEX ) 500 MG capsule Take 1,500 mg by mouth See admin instructions. Take 3 capsules (1500 mg) by mouth prior to dental exams (Patient not taking: Reported on 09/21/2024)      gabapentin  (NEURONTIN ) 100 MG capsule Take 1 capsule (100 mg total) by mouth at bedtime. 30 capsule 5   ibuprofen (ADVIL) 200 MG tablet Take 200-400 mg by mouth every 8 (eight) hours as needed (pain.).     melatonin 5 MG TABS Take 5 mg by mouth at bedtime.     pramipexole  (MIRAPEX ) 1 MG tablet TAKE 1 TABLET (1 MG TOTAL) BY MOUTH AT BEDTIME. AROUND 9 PM DAILY. 90 tablet 1   rasagiline  (AZILECT ) 1 MG TABS tablet TAKE 1 TABLET BY MOUTH EVERY MORNING 90 tablet 1   valACYclovir  (VALTREX ) 1000 MG tablet Take 1,000 mg by mouth 2 (two) times daily as needed (cold sores).     No current facility-administered medications for this visit.    Allergies as of 10/04/2024   (No Known Allergies)    Family History  Problem Relation Age of Onset   Heart disease Mother    Diabetes Father    Parkinson's disease Father    Colon cancer Neg Hx    Esophageal cancer Neg Hx    Rectal cancer Neg Hx    Stomach cancer Neg Hx     Social History   Socioeconomic History   Marital status: Married    Spouse name: Antonio [name]   Number of children: 3   Years of education: Ph.D   Highest education level: Not on file  Occupational History  Employer: [employer]  Tobacco Use   Smoking status: Never   Smokeless tobacco: Never   Tobacco comments:    Never smoked 07/27/23  Vaping Use   Vaping status: Never Used  Substance and Sexual Activity   Alcohol use: Yes    Alcohol/week: 2.0 standard drinks of alcohol    Types: 2 Glasses of wine per week    Comment: 2 glasses of wine weekly 07/27/23   Drug use: No   Sexual activity: Not on file  Other Topics Concern   Not on file  Social History Narrative   Patient is right handed   Patient lives at home with his wife Antonio [name]. 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   Caffeine- coffee on occasion    Patient has a Ph.D.         Social Drivers of Corporate Investment Banker Strain: Not on file  Food  Insecurity: No Food Insecurity (09/08/2023)   Hunger Vital Sign    Worried About Running Out of Food in the Last Year: Never true    Ran Out of Food in the Last Year: Never true  Transportation Needs: No Transportation Needs (09/08/2023)   PRAPARE - Administrator, Civil Service (Medical): No    Lack of Transportation (Non-Medical): No  Physical Activity: Not on file  Stress: Not on file  Social Connections: Unknown (07/03/2023)   Received from [facility]   Social Network    Social Network: Not on file  Intimate Partner Violence: Not At Risk (09/08/2023)   Humiliation, Afraid, Rape, and Kick questionnaire    Fear of Current or Ex-Partner: No    Emotionally Abused: No    Physically Abused: No    Sexually Abused: No    Review of Systems: Positive for *** All other review of systems negative except as mentioned in the HPI.  Physical Exam: Vital signs There were no vitals taken for this visit.  General:   Alert,  Well-developed, well-nourished, pleasant and cooperative in NAD Lungs:  Clear throughout to auscultation.   Heart:  Regular rate and rhythm; no murmurs, clicks, rubs,  or gallops. Abdomen:  Soft, nontender and nondistended. Normal bowel sounds.   Neuro/Psych:  Alert and cooperative. Normal mood and affect. A and O x 3   @Antonio [name]  Antonio Commander, MD, [facility] [phone] (pager) 10/03/2024 6:56 PM@

## 2024-10-04 ENCOUNTER — Ambulatory Visit (AMBULATORY_SURGERY_CENTER): Admitting: Internal Medicine

## 2024-10-04 ENCOUNTER — Encounter: Payer: Self-pay | Admitting: Internal Medicine

## 2024-10-04 VITALS — BP 120/61 | HR 53 | Temp 97.7°F | Resp 19 | Ht 73.5 in | Wt 240.0 lb

## 2024-10-04 DIAGNOSIS — K648 Other hemorrhoids: Secondary | ICD-10-CM | POA: Diagnosis not present

## 2024-10-04 DIAGNOSIS — Z860101 Personal history of adenomatous and serrated colon polyps: Secondary | ICD-10-CM | POA: Diagnosis not present

## 2024-10-04 DIAGNOSIS — K6289 Other specified diseases of anus and rectum: Secondary | ICD-10-CM | POA: Diagnosis not present

## 2024-10-04 DIAGNOSIS — Z860100 Personal history of colon polyps, unspecified: Secondary | ICD-10-CM

## 2024-10-04 DIAGNOSIS — Z1211 Encounter for screening for malignant neoplasm of colon: Secondary | ICD-10-CM

## 2024-10-04 MED ORDER — SODIUM CHLORIDE 0.9 % IV SOLN: 500.0000 mL | Freq: Once | INTRAVENOUS | Status: DC

## 2024-10-04 NOTE — Patient Instructions (Addendum)
 No polyps today.  Would consider repeat colonoscopy in 10 years.  Your hemorrhoids were somewhat swollen which is common after a colonoscopy prep.  Next routine colonoscopy or other screening test in 10 years - 2035.  I appreciate the opportunity to care for you. Antonio CHARLENA Commander, MD, FACG   YOU HAD AN ENDOSCOPIC PROCEDURE TODAY AT THE [facility]:   Refer to the procedure report that was given to you for any specific questions about what was found during the examination.  If the procedure report does not answer your questions, please call your gastroenterologist to clarify.  If you requested that your care partner not be given the details of your procedure findings, then the procedure report has been included in a sealed envelope for you to review at your convenience later.  YOU SHOULD EXPECT: Some feelings of bloating in the abdomen. Passage of more gas than usual.  Walking can help get rid of the air that was put into your GI tract during the procedure and reduce the bloating. If you had a lower endoscopy (such as a colonoscopy or flexible sigmoidoscopy) you may notice spotting of blood in your stool or on the toilet paper. If you underwent a bowel prep for your procedure, you may not have a normal bowel movement for a few days.  Please Note:  You might notice some irritation and congestion in your nose or some drainage.  This is from the oxygen used during your procedure.  There is no need for concern and it should clear up in a day or so.  SYMPTOMS TO REPORT IMMEDIATELY:  Following lower endoscopy (colonoscopy or flexible sigmoidoscopy):  Excessive amounts of blood in the stool  Significant tenderness or worsening of abdominal pains  Swelling of the abdomen that is new, acute  Fever of 100F or higher  For urgent or emergent issues, a gastroenterologist can be reached at any hour by calling [phone]. Do not use MyChart messaging for urgent concerns.    DIET:  We do  recommend a small meal at first, but then you may proceed to your regular diet.  Drink plenty of fluids but you should avoid alcoholic beverages for 24 hours.  ACTIVITY:  You should plan to take it easy for the rest of today and you should NOT DRIVE or use heavy machinery until tomorrow (because of the sedation medicines used during the test).    FOLLOW UP: Our staff will call the number listed on your records the next business day following your procedure.  We will call around 7:15- 8:00 am to check on you and address any questions or concerns that you may have regarding the information given to you following your procedure. If we do not reach you, we will leave a message.     If any biopsies were taken you will be contacted by phone or by letter within the next 1-3 weeks.  Please call us  at [phone] if you have not heard about the biopsies in 3 weeks.    SIGNATURES/CONFIDENTIALITY: You and/or your care partner have signed paperwork which will be entered into your electronic medical record.  These signatures attest to the fact that that the information above on your After Visit Summary has been reviewed and is understood.  Full responsibility of the confidentiality of this discharge information lies with you and/or your care-partner.  Hemorrhoids Hemorrhoids are swollen veins that may form: In the butt (rectum). These are called internal hemorrhoids. Around the opening of  the butt (anus). These are called external hemorrhoids. Most hemorrhoids do not cause very bad problems. They often get better with changes to your lifestyle and what you eat. What are the causes? Having trouble pooping (constipation) or watery poop (diarrhea). Pushing too hard when you poop. Pregnancy. Being very overweight (obese). Sitting for too long. Riding a bike for a long time. Heavy lifting or other things that take a lot of effort. Anal sex. What are the signs or symptoms? Pain. Itching or soreness in  the butt. Bleeding from the butt. Leaking poop. Swelling. One or more lumps around the opening of your butt. How is this treated? In most cases, hemorrhoids can be treated at home. You may be told to: Change what you eat. Make changes to your lifestyle. If these treatments do not help, you may need to have a procedure done. Your doctor may need to: Place rubber bands at the bottom of the hemorrhoids to make them fall off. Put medicine into the hemorrhoids to shrink them. Shine a type of light on the hemorrhoids to cause them to fall off. Do surgery to get rid of the hemorrhoids. Follow these instructions at home: Medicines Take over-the-counter and prescription medicines only as told by your doctor. Use creams with medicine in them or medicines that you put in your butt as told by your doctor. Eating and drinking  Eat foods that have a lot of fiber in them. These include whole grains, beans, nuts, fruits, and vegetables. Ask your doctor about taking products that have fiber added to them (fibersupplements). Take in less fat. You can do this by: Eating low-fat dairy products. Eating less red meat. Staying away from processed foods. Drink enough fluid to keep your pee (urine) pale yellow. Managing pain and swelling  Take a warm-water  bath (sitz bath) for 20 minutes to ease pain. Do this 3-4 times a day. You may do this in a bathtub. You may also use a portable sitz bath that fits over the toilet. If told, put ice on the painful area. It may help to use ice between your warm baths. Put ice in a plastic bag. Place a towel between your skin and the bag. Leave the ice on for 20 minutes, 2-3 times a day. If your skin turns bright red, take off the ice right away to prevent skin damage. The risk of damage is higher if you cannot feel pain, heat, or cold. General instructions Exercise. Ask your doctor how much and what kind of exercise is best for you. Go to the bathroom when you need to  poop. Do not wait. Try not to push too hard when you poop. Keep your butt dry and clean. Use wet toilet paper or moist towelettes after you poop. Do not sit on the toilet for a long time. Contact a doctor if: You have pain and swelling that do not get better with treatment. You have trouble pooping. You cannot poop. You have pain or swelling outside the area of the hemorrhoids. Get help right away if: You have bleeding from the butt that will not stop. This information is not intended to replace advice given to you by your health care provider. Make sure you discuss any questions you have with your health care provider. Document Revised: 07/22/2022 Document Reviewed: 07/22/2022 Elsevier Patient Education  2024 [employer].

## 2024-10-04 NOTE — Op Note (Signed)
 [facility] Patient Name: Antonio [name] Procedure Date: 10/04/2024 9:34 AM MRN: [id number] Endoscopist: Lupita FORBES Commander , MD, [id number] Age: 63 Referring MD:  Date of Birth: 1961-01-14 Gender: Male Account #: [id number] Procedure:                Colonoscopy Indications:              High risk colon cancer surveillance: Personal                            history of colonic polyps, Last colonoscopy: 2017 Medicines:                Monitored Anesthesia Care Procedure:                Pre-Anesthesia Assessment:                           - Prior to the procedure, a History and Physical                            was performed, and patient medications and                            allergies were reviewed. The patient's tolerance of                            previous anesthesia was also reviewed. The risks                            and benefits of the procedure and the sedation                            options and risks were discussed with the patient.                            All questions were answered, and informed consent                            was obtained. Prior Anticoagulants: The patient has                            taken no anticoagulant or antiplatelet agents. ASA                            Grade Assessment: III - A patient with severe                            systemic disease. After reviewing the risks and                            benefits, the patient was deemed in satisfactory                            condition to undergo the procedure.  After obtaining informed consent, the colonoscope                            was passed under direct vision. Throughout the                            procedure, the patient's blood pressure, pulse, and                            oxygen saturations were monitored continuously. The                            Olympus Scope [id number] was introduced through the                            anus and  advanced to the the cecum, identified by                            appendiceal orifice and ileocecal valve. The                            colonoscopy was somewhat difficult due to                            significant looping. Successful completion of the                            procedure was aided by applying abdominal pressure.                            The patient tolerated the procedure well. The                            quality of the bowel preparation was good. The                            ileocecal valve, appendiceal orifice, and rectum                            were photographed. Scope In: 9:57:35 AM Scope Out: 10:13:46 AM Scope Withdrawal Time: 0 hours 10 minutes 49 seconds  Total Procedure Duration: 0 hours 16 minutes 11 seconds  Findings:                 The perianal and digital rectal examinations were                            normal.                           Internal hemorrhoids were found.                           Anal papilla(e) were hypertrophied.  The exam was otherwise without abnormality on                            direct and retroflexion views. Complications:            No immediate complications. Estimated Blood Loss:     Estimated blood loss: none. Impression:               - Internal hemorrhoids.                           - Anal papilla(e) were hypertrophied.                           - The examination was otherwise normal on direct                            and retroflexion views.                           - No specimens collected.                           - Personal history of colonic polyp - 2012 5 mm                            adenoma. Recommendation:           - Patient has a contact number available for                            emergencies. The signs and symptoms of potential                            delayed complications were discussed with the                            patient. Return to normal activities  tomorrow.                            Written discharge instructions were provided to the                            patient.                           - Resume previous diet.                           - Continue present medications.                           - Repeat colonoscopy in 10 years. Lupita FORBES Commander, MD 10/04/2024 10:22:35 AM This report has been signed electronically.

## 2024-10-04 NOTE — Progress Notes (Signed)
Pt. states no medical or surgical changes since previsit or office visit. 

## 2024-10-04 NOTE — Progress Notes (Signed)
 Vss nad trans to pacu

## 2024-10-05 ENCOUNTER — Telehealth: Payer: Self-pay

## 2024-10-05 NOTE — Telephone Encounter (Signed)
  Follow up Call-     10/04/2024    9:01 AM  Call back number  Post procedure Call Back phone  # [phone]  Permission to leave phone message Yes     Patient questions:  Do you have a fever, pain , or abdominal swelling? No. Pain Score  0 *  Have you tolerated food without any problems? Yes.    Have you been able to return to your normal activities? Yes.    Do you have any questions about your discharge instructions: Diet   No. Medications  No. Follow up visit  No.  Do you have questions or concerns about your Care? No.  Actions: * If pain score is 4 or above: No action needed, pain <4.

## 2024-10-15 DIAGNOSIS — I480 Paroxysmal atrial fibrillation: Secondary | ICD-10-CM

## 2024-10-18 ENCOUNTER — Ambulatory Visit: Payer: Self-pay | Admitting: Pulmonary Disease

## 2024-11-07 NOTE — Progress Notes (Unsigned)
°  Electrophysiology Office Note:   Date:  11/08/2024  ID:  Antonio [name], DOB [birth year], MRN [id number]  Primary Cardiologist: None Primary Heart Failure: None Electrophysiologist: Fonda Kitty, MD      History of Present Illness:   Antonio [name] is a 63 y.o. male, works at [employer] and has a PhD in wine making, with h/o AF, Parkinson's disease s/p deep brain stimulator seen today for routine electrophysiology followup.   He was seen in EP Clinic 09/21/24 for increased fatigue & concerns he might be out of rhythm.  He was in NSR.  Cardiac monitor was ordered.   Since last being seen in our clinic the patient reports doing ok. He reports he is pending a visit to his Neurology team here in [place].  He notes that his sleep is disjointed due to his Parkinson's. He does not drink ETOH. He reports his sleep patterns are variable.    He denies chest pain, palpitations, dyspnea, PND, orthopnea, nausea, vomiting, dizziness, syncope, edema, weight gain, or early satiety.   Review of systems complete and found to be negative unless listed in HPI.   EP Information / Studies Reviewed:    EKG is not ordered today. EKG from 09/21/24 reviewed which showed NSR 66 bpm       Arrhythmia / AAD / Pertinent EP Studies AF  DCCV 04/2023  EPS 09/08/23 > PVI + PW ablation  Cardiac Monitor 10/15/24 > No AF detected or sustained arrhythmia, 3 NS SVT episodes with longest lasting 6 beats, rare ectopy, symptom trigger episodes correspond to SR  Risk Assessment/Calculations:    CHA2DS2-VASc Score = 1   This indicates a 0.6% annual risk of stroke. The patient's score is based upon: CHF History: 0 HTN History: 0 Diabetes History: 0 Stroke History: 0 Vascular Disease History: 0 Age Score: 1 Gender Score: 0              Physical Exam:   VS:  BP (!) 98/58   Pulse 73   Ht 6' 1.5 (1.867 m)   Wt 246 lb (111.6 kg)   SpO2 99%   BMI 32.02 kg/m    Wt Readings from Last 3 Encounters:  11/08/24 246 lb  (111.6 kg)  10/04/24 240 lb (108.9 kg)  09/21/24 240 lb (108.9 kg)     GEN: Well nourished, well developed in no acute distress NECK: No JVD; No carotid bruits CARDIAC: Regular rate and rhythm, no murmurs, rubs, gallops RESPIRATORY:  Clear to auscultation without rales, wheezing or rhonchi  ABDOMEN: Soft, non-tender, non-distended EXTREMITIES:  No edema; No deformity   ASSESSMENT AND PLAN:    Paroxysmal Atrial Fibrillation  CHA2DS2-VASc 1 (age), s/p PVI + PW ablation 08/2023  -cardiac monitor reviewed with patient in detail  -suspect fatigue related to other etiology > normal EF, largely negative cardiac monitor and CCS of 0 are reassuring.   -not on OAC with low risk score and low AF burden  Parkinson's Disease  S/p Neuro Stimulator  -per Neurology  -we reviewed sleep hygiene in detail, disturbances related to Parkinson's disease and encouraged him to keep a sleep log, go to bed at the same time every night, get up at the same time every morning, cool dark room etc.   Follow up with EP APP in 6 months or sooner if new needs arise  Signed, Daphne Barrack, NP-C, AGACNP-BC [facility] - Electrophysiology  11/08/2024, 10:07 AM

## 2024-11-08 ENCOUNTER — Ambulatory Visit: Attending: Pulmonary Disease | Admitting: Pulmonary Disease

## 2024-11-08 ENCOUNTER — Encounter: Payer: Self-pay | Admitting: Pulmonary Disease

## 2024-11-08 VITALS — BP 98/58 | HR 73 | Ht 73.5 in | Wt 246.0 lb

## 2024-11-08 DIAGNOSIS — I480 Paroxysmal atrial fibrillation: Secondary | ICD-10-CM

## 2024-11-08 NOTE — Patient Instructions (Signed)
 Medication Instructions:  Your physician recommends that you continue on your current medications as directed. Please refer to the Current Medication list given to you today.  *If you need a refill on your cardiac medications before your next appointment, please call your pharmacy*  Lab Work: None ordered  If you have labs (blood work) drawn today and your tests are completely normal, you will receive your results only by: MyChart Message (if you have MyChart) OR A paper copy in the mail If you have any lab test that is abnormal or we need to change your treatment, we will call you to review the results.  Follow-Up: At [facility], you and your health needs are our priority.  As part of our continuing mission to provide you with exceptional heart care, our providers are all part of one team.  This team includes your primary Cardiologist (physician) and Advanced Practice Providers or APPs (Physician Assistants and Nurse Practitioners) who all work together to provide you with the care you need, when you need it.  Your next appointment:   6 month(s)  Provider:   Creighton Doffing, NP

## 2024-12-18 ENCOUNTER — Ambulatory Visit: Admitting: Adult Health

## 2024-12-19 NOTE — Progress Notes (Unsigned)
 " Antonio [name] [address] [place]. [place] [address] [id number]       OFFICE FOLLOW UP NOTE  Mr. Antonio [name] Date of Birth:  [birth year] Medical Record Number:  [id number]    Primary neurologist: Dr. Buck Reason for visit: Parkinson's disease    SUBJECTIVE: Chief Complaint  Patient presents with   Follow-up    Patient in room 3 with wife. Patient is here for parkinson follow up, Patient states that his mental ability has declined, and that he's wanting to potentially get on short term disability.      Follow-up visit:  Prior visit: 05/29/2024 with Dr. Buck  Brief HPI:   Antonio [name] is a 64 y.o. male who is followed for right sided predominant Parkinson's disease s/p L then R DBS and RLS. PD dx dates back to 2009 with symptoms dating to around 2008. He is status post L DBS placement in 2019, generator placement in September and electrode placement in August 2019. He is status post right DBS in June 2021.  Has been on Neupro  and Sinemet  IR in the past but these were stopped.  At prior visit with Dr. Buck, continued on Azilect  daily and advised to increase his Sinemet  CR back to 1 pill 3 times daily (self decreased to twice daily 6 months prior), advised use of rolling walker consistently and to continue to follow with [facility] as scheduled.    Interval history:  Patient returns for follow-up visit accompanied by his wife.  He continues to struggle with memory difficulties and feels this has continued to decline as well as continued gait impairment.  He continues on Sinemet  CR 1 tab 3 times daily, Azilect  1 tab every morning, Mirapex  1 tab nightly and gabapentin  100 mg nightly.  Needs to take Mirapex  and gabapentin  2 hours prior to bedtime for RLS relief and he feels this regimen works well.  His wife notes issues with dyskinesias, she describes these as postural instability type symptoms which are typically present pursing in the morning or  mid afternoon with increased fatigue or if late with taking Sinemet  dosage.  He does continue to have recurrent falls but thankfully no significant injury, he does not consistently use walker.  Continues to work with Parkinson's movement class once per [facility] [place].  He does try to stay active with routine physical activity.  He has not had any recent follow-up with [facility] for DBS monitoring, prior visit in 01/2023.  He brought programming device with him to the office so his DBS could be turned off so we could determine a baseline and note effectiveness of device; of note, he also has not yet taken his Sinemet  dosage so we could also see a baseline without his medications.  He continues to work at [employer], now working from home, 9am-5pm Mon-Friday. He recently admitted to his wife that he has been having more difficulty adequately performing work duties due to difficulty retaining information, word retrieval difficulty and short-term memory difficulties.  He is questioning pursuing short-term disability and eventual long-term disability.       ROS:   14 system review of systems performed and negative with exception of those listed in HPI  PMH:  Past Medical History:  Diagnosis Date   Arthritis    Dyskinesia    occasionally    Hx of colonic polyps 12/10/2010   Leg pain left   Neuromuscular disorder (HCC)    Parkinson's disease (HCC)  PSH:  Past Surgical History:  Procedure Laterality Date   ATRIAL FIBRILLATION ABLATION N/A 09/08/2023   Procedure: ATRIAL FIBRILLATION ABLATION;  Surgeon: Kennyth Chew, MD;  Location: [facility] INVASIVE CV LAB;  Service: Cardiovascular;  Laterality: N/A;   CARDIOVERSION N/A 05/17/2023   Procedure: CARDIOVERSION;  Surgeon: Pietro Redell RAMAN, MD;  Location: [facility] INVASIVE CV LAB;  Service: Cardiovascular;  Laterality: N/A;   COLONOSCOPY     deep brain stimulation     06/2018/ and second 2021   KNEE ARTHROSCOPY Right 01/2017    with Dr Ernie ;at surgical center    None listed     TOTAL KNEE ARTHROPLASTY Right 09/28/2017   Procedure: RIGHT TOTAL KNEE ARTHROPLASTY;  Surgeon: Ernie Cough, MD;  Location: [facility] ORS;  Service: Orthopedics;  Laterality: Right;  70 mins   TOTAL KNEE ARTHROPLASTY Left 04/12/2018   Procedure: LEFT TOTAL KNEE ARTHROPLASTY;  Surgeon: Ernie Cough, MD;  Location: [facility] ORS;  Service: Orthopedics;  Laterality: Left;  Adductor Block    Social History:  Social History   Socioeconomic History   Marital status: Married    Spouse name: Antonio [name]   Number of children: 3   Years of education: Ph.D   Highest education level: Not on file  Occupational History    Employer: [employer]  Tobacco Use   Smoking status: Never   Smokeless tobacco: Never   Tobacco comments:    Never smoked 07/27/23  Vaping Use   Vaping status: Never Used  Substance and Sexual Activity   Alcohol use: Yes    Alcohol/week: 2.0 standard drinks of alcohol    Types: 2 Glasses of wine per week    Comment: 2 glasses of wine weekly 07/27/23   Drug use: No   Sexual activity: Not on file  Other Topics Concern   Not on file  Social History Narrative   Patient is right handed   Patient lives at home with his wife Antonio [name]. 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   Caffeine- coffee on occasion    Patient has a Ph.D.         Social Drivers of Health   Tobacco Use: Low Risk (12/20/2024)   Patient History    Smoking Tobacco Use: Never    Smokeless Tobacco Use: Never    Passive Exposure: Not on file  Financial Resource Strain: Not on file  Food Insecurity: No Food Insecurity (09/08/2023)   Hunger Vital Sign    Worried About Running Out of Food in the Last Year: Never true    Ran Out of Food in the Last Year: Never true  Transportation Needs: No Transportation Needs (09/08/2023)   PRAPARE - Administrator, Civil Service (Medical): No    Lack of  Transportation (Non-Medical): No  Physical Activity: Not on file  Stress: Not on file  Social Connections: Not on file  Intimate Partner Violence: Not At Risk (09/08/2023)   Humiliation, Afraid, Rape, and Kick questionnaire    Fear of Current or Ex-Partner: No    Emotionally Abused: No    Physically Abused: No    Sexually Abused: No  Depression (PHQ2-9): Not on file  Alcohol Screen: Not on file  Housing: Low Risk (09/08/2023)   Housing    Last Housing Risk Score: 0  Utilities: Not At Risk (09/08/2023)   AHC Utilities    Threatened with loss of utilities: No  Health Literacy: Not on file  Family History:  Family History  Problem Relation Age of Onset   Heart disease Mother    Diabetes Father    Parkinson's disease Father    Colon cancer Neg Hx    Esophageal cancer Neg Hx    Rectal cancer Neg Hx    Stomach cancer Neg Hx     Medications:   Current Outpatient Medications on File Prior to Visit  Medication Sig Dispense Refill   carbidopa -levodopa  (SINEMET  CR) 50-200 MG tablet TAKE 1 TABLET BY MOUTH THREE TIMES A DAY 270 tablet 3   ciclopirox (PENLAC) 8 % solution Apply 1 Application topically at bedtime.     gabapentin  (NEURONTIN ) 100 MG capsule Take 1 capsule (100 mg total) by mouth at bedtime. 30 capsule 5   ibuprofen (ADVIL) 200 MG tablet Take 200-400 mg by mouth every 8 (eight) hours as needed (pain.).     melatonin 5 MG TABS Take 5 mg by mouth at bedtime.     pramipexole  (MIRAPEX ) 1 MG tablet TAKE 1 TABLET (1 MG TOTAL) BY MOUTH AT BEDTIME. AROUND 9 PM DAILY. 90 tablet 1   rasagiline  (AZILECT ) 1 MG TABS tablet TAKE 1 TABLET BY MOUTH EVERY MORNING 90 tablet 1   valACYclovir  (VALTREX ) 1000 MG tablet Take 1,000 mg by mouth 2 (two) times daily as needed (cold sores).     cephALEXin  (KEFLEX ) 500 MG capsule Take 1,500 mg by mouth See admin instructions. Take 3 capsules (1500 mg) by mouth prior to dental exams (Patient not taking: Reported on 12/20/2024)     No current  facility-administered medications on file prior to visit.    Allergies:  No Known Allergies    OBJECTIVE:  Physical Exam Today's Vitals   12/20/24 1234  BP: (!) 140/73  Pulse: 81  Weight: 244 lb (110.7 kg)  Height: 6' 1.5 (1.867 m)   Body mass index is 31.76 kg/m.   General: well developed, well nourished, seated, in no evident distress  Neurologic Exam Mental Status: Awake and fully alert. Oriented to place and time. Recent and remote memory intact. Attention span, concentration and fund of knowledge appropriate during visit, wife supplements on history. Mood and affect appropriate.  Moderate facial masking.  Mild hypophonia. Cranial Nerves: Pupils equal, briskly reactive to light. Extraocular movements full without nystagmus. Visual fields full to confrontation. Hearing intact. Facial sensation intact. Face, tongue, palate moves normally and symmetrically.  Motor: Normal strength in all tested extremity muscles.  Intermittent R>L resting tremor.  No obvious dyskinesias.  Tone mildly increased RUE, mild to moderate bradykinesia. Gait and Station: Arises from chair without difficulty.  Posture is stooped.  Some start hesitation initially, increased difficulties with turns, mildly decreased stride length and step height bilaterally.  Decreased arm swing bilaterally.  No use of AD.        ASSESSMENT/PLAN: JAMILLE YOSHINO is a 64 y.o. year old male with history of right sided predominant Parkinson's disease initially diagnosed in 2009 s/p L DBS 2019 and R DBS 2021.  Parkinson's disease complicated by progressive gait impairment and progressive cognitive decline.     Parkinson's disease:  RLS: Continue Sinemet  CR 50-200 3 times daily -advised to take around 9 AM, 1 PM and 5 PM.  Discussed importance of taking medication consistently without missing dosages Continue Azilect  1 mg daily Continues Mirapex  1 mg nightly Continue gabapentin  100 mg nightly Wife mentions issues  occasionally with gait/movement primarily in the morning and mid afternoon (with fatigue or missed Sinemet  dose) -unclear if this  is true dyskinesias vs postural instability.  Continue to monitor and call if this should worsen. Advised to schedule follow-up visit with [facility] Dr. Rosalia for DBS monitoring.  He was advised against turning off DBS during today's visit as this would be of no benefit. This may be done by Dr. Rosalia who manages DBS but no indication to be done today. Prior neurocognitive evaluation in 2021 prior to DBS.  Recommend repeat evaluation due to concerns about cognitive decline now interfering with his job. If unable to be seen by [facility], referral will be placed to be completed within [facility] system. He is requesting assistance with short-term disability which will likely transition to long-term disability - will follow up with Dr. Buck regarding this Continue working with PT once weekly, consider more frequent visits but limited due to work schedule. Discussed importance of continuing routine exercise.  Previously tried: Neupro  patch, Sinemet  IR     Follow up in 6 months with Dr. Buck or call earlier if needed   CC:  PCP: Remonia Alm PARAS, MD    Harlene Bogaert, AGNP-BC  [name] [address] [place], [place] [address]  Phone [phone] Fax [phone] Note: This document was prepared with digital dictation and possible smart phrase technology. Any transcriptional errors that result from this process are unintentional.  "

## 2024-12-20 ENCOUNTER — Ambulatory Visit: Admitting: Adult Health

## 2024-12-20 ENCOUNTER — Encounter: Payer: Self-pay | Admitting: Adult Health

## 2024-12-20 ENCOUNTER — Telehealth: Payer: Self-pay | Admitting: Adult Health

## 2024-12-20 VITALS — BP 140/73 | HR 81 | Ht 73.5 in | Wt 244.0 lb

## 2024-12-20 DIAGNOSIS — R419 Unspecified symptoms and signs involving cognitive functions and awareness: Secondary | ICD-10-CM | POA: Diagnosis not present

## 2024-12-20 DIAGNOSIS — G2581 Restless legs syndrome: Secondary | ICD-10-CM

## 2024-12-20 DIAGNOSIS — G20A2 Parkinson's disease without dyskinesia, with fluctuations: Secondary | ICD-10-CM | POA: Diagnosis not present

## 2024-12-20 DIAGNOSIS — Z9689 Presence of other specified functional implants: Secondary | ICD-10-CM

## 2024-12-20 NOTE — Patient Instructions (Addendum)
 Your Plan:  Continue Sinemet  CR 1 tab 3 times daily  Continue Azilect  1 tab daily  Continue Mirapex  and gabapentin  nightly   Schedule follow-up visit with [facility] for routine DBS monitoring Will discuss repeat neurocognitive evaluation with Dr. Rosalia - if unable to be seen by [facility], I will place a new referral to a [place] facility       Follow-up in 6 months with Dr. Buck or call earlier if needed      Thank you for coming to see us  at [facility]. I hope we have been able to provide you high quality care today.  You may receive a patient satisfaction survey over the next few weeks. We would appreciate your feedback and comments so that we may continue to improve ourselves and the health of our patients.

## 2024-12-20 NOTE — Addendum Note (Signed)
 Addended by: WHITFIELD RAISIN L on: 12/20/2024 04:12 PM   Modules accepted: Orders

## 2024-12-20 NOTE — Telephone Encounter (Signed)
 Referral for neuropsychology fax to [facility]. Phone: [phone], Fax: [phone]

## 2024-12-21 ENCOUNTER — Encounter: Payer: Self-pay | Admitting: Adult Health

## 2024-12-21 DIAGNOSIS — G20A2 Parkinson's disease without dyskinesia, with fluctuations: Secondary | ICD-10-CM

## 2024-12-21 DIAGNOSIS — R419 Unspecified symptoms and signs involving cognitive functions and awareness: Secondary | ICD-10-CM

## 2024-12-27 ENCOUNTER — Telehealth: Payer: Self-pay | Admitting: Adult Health

## 2024-12-27 NOTE — Telephone Encounter (Signed)
 Referral for neuropsychology fax to [facility]. Phone: [phone], Fax: [phone]

## 2025-01-02 ENCOUNTER — Ambulatory Visit: Payer: Self-pay

## 2025-01-02 ENCOUNTER — Institutional Professional Consult (permissible substitution): Payer: Self-pay | Admitting: Psychology

## 2025-01-09 ENCOUNTER — Encounter: Payer: Self-pay | Admitting: Psychology

## 2025-06-19 ENCOUNTER — Ambulatory Visit: Admitting: Neurology
